# Patient Record
Sex: Male | Born: 1955 | Race: White | Hispanic: No | Marital: Married | Smoking: Never smoker
Health system: Southern US, Community
[De-identification: ages and names within clinical notes are randomized; demographics above are authoritative.]

## PROBLEM LIST (undated history)

## (undated) DIAGNOSIS — Z8489 Family history of other specified conditions: Secondary | ICD-10-CM

## (undated) DIAGNOSIS — R7303 Prediabetes: Secondary | ICD-10-CM

## (undated) DIAGNOSIS — F329 Major depressive disorder, single episode, unspecified: Secondary | ICD-10-CM

## (undated) DIAGNOSIS — G8929 Other chronic pain: Secondary | ICD-10-CM

## (undated) DIAGNOSIS — M545 Low back pain, unspecified: Secondary | ICD-10-CM

## (undated) DIAGNOSIS — G4733 Obstructive sleep apnea (adult) (pediatric): Secondary | ICD-10-CM

## (undated) DIAGNOSIS — E291 Testicular hypofunction: Secondary | ICD-10-CM

## (undated) DIAGNOSIS — N492 Inflammatory disorders of scrotum: Secondary | ICD-10-CM

## (undated) DIAGNOSIS — T7840XA Allergy, unspecified, initial encounter: Secondary | ICD-10-CM

## (undated) DIAGNOSIS — M722 Plantar fascial fibromatosis: Secondary | ICD-10-CM

## (undated) DIAGNOSIS — E785 Hyperlipidemia, unspecified: Secondary | ICD-10-CM

## (undated) DIAGNOSIS — F419 Anxiety disorder, unspecified: Secondary | ICD-10-CM

## (undated) DIAGNOSIS — F32A Depression, unspecified: Secondary | ICD-10-CM

## (undated) DIAGNOSIS — K219 Gastro-esophageal reflux disease without esophagitis: Secondary | ICD-10-CM

## (undated) DIAGNOSIS — I10 Essential (primary) hypertension: Secondary | ICD-10-CM

## (undated) DIAGNOSIS — M1990 Unspecified osteoarthritis, unspecified site: Secondary | ICD-10-CM

## (undated) HISTORY — PX: OTHER SURGICAL HISTORY: SHX169

## (undated) HISTORY — DX: Plantar fascial fibromatosis: M72.2

## (undated) HISTORY — DX: Low back pain: M54.5

## (undated) HISTORY — PX: TONSILLECTOMY: SUR1361

## (undated) HISTORY — PX: APPENDECTOMY: SHX54

## (undated) HISTORY — DX: Allergy, unspecified, initial encounter: T78.40XA

## (undated) HISTORY — DX: Low back pain, unspecified: M54.50

## (undated) HISTORY — DX: Testicular hypofunction: E29.1

## (undated) HISTORY — PX: JOINT REPLACEMENT: SHX530

## (undated) HISTORY — PX: KNEE ARTHROSCOPY: SHX127

## (undated) HISTORY — DX: Inflammatory disorders of scrotum: N49.2

## (undated) HISTORY — DX: Other chronic pain: G89.29

## (undated) HISTORY — DX: Gastro-esophageal reflux disease without esophagitis: K21.9

## (undated) HISTORY — DX: Unspecified osteoarthritis, unspecified site: M19.90

## (undated) HISTORY — DX: Obstructive sleep apnea (adult) (pediatric): G47.33

## (undated) HISTORY — DX: Anxiety disorder, unspecified: F41.9

## (undated) HISTORY — DX: Hyperlipidemia, unspecified: E78.5

---

## 1998-05-28 ENCOUNTER — Inpatient Hospital Stay (HOSPITAL_COMMUNITY): Admission: EM | Admit: 1998-05-28 | Discharge: 1998-05-29 | Payer: Self-pay | Admitting: Internal Medicine

## 1998-05-28 ENCOUNTER — Encounter: Payer: Self-pay | Admitting: Internal Medicine

## 1998-05-28 ENCOUNTER — Emergency Department (HOSPITAL_COMMUNITY): Admission: EM | Admit: 1998-05-28 | Discharge: 1998-05-28 | Payer: Self-pay | Admitting: Emergency Medicine

## 2001-04-01 ENCOUNTER — Ambulatory Visit (HOSPITAL_COMMUNITY): Admission: RE | Admit: 2001-04-01 | Discharge: 2001-04-01 | Payer: Self-pay | Admitting: Orthopedic Surgery

## 2001-04-01 ENCOUNTER — Encounter: Payer: Self-pay | Admitting: Orthopedic Surgery

## 2001-11-09 ENCOUNTER — Encounter: Payer: Self-pay | Admitting: Specialist

## 2001-11-09 ENCOUNTER — Ambulatory Visit (HOSPITAL_COMMUNITY)
Admission: RE | Admit: 2001-11-09 | Discharge: 2001-11-09 | Payer: Self-pay | Admitting: Physical Medicine & Rehabilitation

## 2004-08-20 ENCOUNTER — Ambulatory Visit: Payer: Self-pay | Admitting: Family Medicine

## 2004-11-18 ENCOUNTER — Ambulatory Visit: Payer: Self-pay | Admitting: Family Medicine

## 2005-03-24 ENCOUNTER — Inpatient Hospital Stay (HOSPITAL_COMMUNITY): Admission: RE | Admit: 2005-03-24 | Discharge: 2005-03-27 | Payer: Self-pay | Admitting: Orthopedic Surgery

## 2005-11-25 ENCOUNTER — Ambulatory Visit: Payer: Self-pay | Admitting: Family Medicine

## 2006-04-02 ENCOUNTER — Inpatient Hospital Stay (HOSPITAL_COMMUNITY): Admission: RE | Admit: 2006-04-02 | Discharge: 2006-04-04 | Payer: Self-pay | Admitting: Orthopedic Surgery

## 2006-05-26 HISTORY — PX: COLONOSCOPY: SHX174

## 2006-06-15 ENCOUNTER — Ambulatory Visit: Payer: Self-pay | Admitting: Family Medicine

## 2006-06-15 LAB — CONVERTED CEMR LAB
AST: 17 units/L (ref 0–37)
BUN: 10 mg/dL (ref 6–23)
CO2: 29 meq/L (ref 19–32)
Chloride: 105 meq/L (ref 96–112)
Creatinine, Ser: 0.9 mg/dL (ref 0.4–1.5)
Eosinophils Relative: 1.6 % (ref 0.0–5.0)
HCT: 41.9 % (ref 39.0–52.0)
HDL: 39.2 mg/dL (ref >39.0)
Hemoglobin: 14.5 g/dL (ref 13.0–17.0)
MCHC: 34.5 g/dL (ref 30.0–36.0)
MCV: 83.1 fL (ref 78.0–100.0)
Monocytes Absolute: 0.5 10*3/uL (ref 0.2–0.7)
Neutro Abs: 3.3 10*3/uL (ref 1.4–7.7)
Neutrophils Relative %: 60.6 % (ref 43.0–77.0)
TSH: 2.04 microintl units/mL (ref 0.35–5.50)
Total CHOL/HDL Ratio: 4.8
Triglycerides: 138 mg/dL (ref 0–149)
VLDL: 28 mg/dL (ref 0–40)

## 2006-06-19 ENCOUNTER — Ambulatory Visit: Payer: Self-pay | Admitting: Family Medicine

## 2006-09-21 ENCOUNTER — Ambulatory Visit: Payer: Self-pay | Admitting: Family Medicine

## 2006-10-08 ENCOUNTER — Ambulatory Visit: Payer: Self-pay | Admitting: Gastroenterology

## 2006-10-21 ENCOUNTER — Ambulatory Visit: Payer: Self-pay | Admitting: Gastroenterology

## 2006-10-21 ENCOUNTER — Encounter: Payer: Self-pay | Admitting: Gastroenterology

## 2007-02-01 ENCOUNTER — Ambulatory Visit: Payer: Self-pay | Admitting: Family Medicine

## 2007-02-01 DIAGNOSIS — J309 Allergic rhinitis, unspecified: Secondary | ICD-10-CM | POA: Insufficient documentation

## 2007-02-01 DIAGNOSIS — F419 Anxiety disorder, unspecified: Secondary | ICD-10-CM | POA: Insufficient documentation

## 2007-02-01 DIAGNOSIS — M722 Plantar fascial fibromatosis: Secondary | ICD-10-CM | POA: Insufficient documentation

## 2007-02-01 DIAGNOSIS — Z9189 Other specified personal risk factors, not elsewhere classified: Secondary | ICD-10-CM | POA: Insufficient documentation

## 2007-02-01 DIAGNOSIS — F411 Generalized anxiety disorder: Secondary | ICD-10-CM

## 2007-07-09 ENCOUNTER — Ambulatory Visit: Payer: Self-pay | Admitting: Family Medicine

## 2007-07-09 DIAGNOSIS — M1990 Unspecified osteoarthritis, unspecified site: Secondary | ICD-10-CM | POA: Insufficient documentation

## 2007-07-09 DIAGNOSIS — M4716 Other spondylosis with myelopathy, lumbar region: Secondary | ICD-10-CM | POA: Insufficient documentation

## 2007-07-09 DIAGNOSIS — S335XXA Sprain of ligaments of lumbar spine, initial encounter: Secondary | ICD-10-CM

## 2007-07-28 ENCOUNTER — Telehealth: Payer: Self-pay | Admitting: Family Medicine

## 2007-09-10 ENCOUNTER — Encounter: Payer: Self-pay | Admitting: Family Medicine

## 2007-09-10 ENCOUNTER — Telehealth (INDEPENDENT_AMBULATORY_CARE_PROVIDER_SITE_OTHER): Payer: Self-pay | Admitting: *Deleted

## 2007-11-15 ENCOUNTER — Ambulatory Visit: Payer: Self-pay | Admitting: Family Medicine

## 2007-11-15 DIAGNOSIS — R002 Palpitations: Secondary | ICD-10-CM | POA: Insufficient documentation

## 2007-11-15 DIAGNOSIS — K219 Gastro-esophageal reflux disease without esophagitis: Secondary | ICD-10-CM | POA: Insufficient documentation

## 2007-11-24 ENCOUNTER — Ambulatory Visit: Payer: Self-pay

## 2007-11-24 ENCOUNTER — Encounter: Payer: Self-pay | Admitting: Family Medicine

## 2007-11-25 ENCOUNTER — Ambulatory Visit: Payer: Self-pay

## 2007-12-08 ENCOUNTER — Telehealth: Payer: Self-pay | Admitting: Family Medicine

## 2008-02-18 ENCOUNTER — Telehealth: Payer: Self-pay | Admitting: Family Medicine

## 2008-07-28 ENCOUNTER — Telehealth: Payer: Self-pay | Admitting: Family Medicine

## 2008-09-20 ENCOUNTER — Telehealth: Payer: Self-pay | Admitting: Family Medicine

## 2008-12-04 ENCOUNTER — Telehealth: Payer: Self-pay | Admitting: Family Medicine

## 2008-12-05 ENCOUNTER — Ambulatory Visit: Payer: Self-pay | Admitting: Family Medicine

## 2008-12-05 DIAGNOSIS — M545 Low back pain, unspecified: Secondary | ICD-10-CM | POA: Insufficient documentation

## 2008-12-07 ENCOUNTER — Telehealth: Payer: Self-pay | Admitting: Family Medicine

## 2008-12-07 ENCOUNTER — Encounter: Payer: Self-pay | Admitting: Family Medicine

## 2008-12-19 ENCOUNTER — Telehealth: Payer: Self-pay | Admitting: Family Medicine

## 2008-12-28 ENCOUNTER — Encounter: Payer: Self-pay | Admitting: Family Medicine

## 2008-12-29 ENCOUNTER — Telehealth: Payer: Self-pay | Admitting: Family Medicine

## 2009-01-01 ENCOUNTER — Encounter: Payer: Self-pay | Admitting: Family Medicine

## 2009-02-09 ENCOUNTER — Telehealth: Payer: Self-pay | Admitting: Family Medicine

## 2009-05-21 ENCOUNTER — Telehealth: Payer: Self-pay | Admitting: Family Medicine

## 2009-09-17 ENCOUNTER — Ambulatory Visit: Payer: Self-pay | Admitting: Family Medicine

## 2009-09-17 DIAGNOSIS — R1013 Epigastric pain: Secondary | ICD-10-CM | POA: Insufficient documentation

## 2009-09-18 ENCOUNTER — Encounter: Admission: RE | Admit: 2009-09-18 | Discharge: 2009-09-18 | Payer: Self-pay | Admitting: Family Medicine

## 2009-10-17 ENCOUNTER — Ambulatory Visit: Payer: Self-pay | Admitting: Family Medicine

## 2009-10-17 LAB — CONVERTED CEMR LAB
Blood in Urine, dipstick: NEGATIVE
Ketones, urine, test strip: NEGATIVE
Urobilinogen, UA: 0.2

## 2009-10-18 LAB — CONVERTED CEMR LAB
Alkaline Phosphatase: 56 units/L (ref 39–117)
Basophils Absolute: 0 10*3/uL (ref 0.0–0.1)
Basophils Relative: 0.8 % (ref 0.0–3.0)
Bilirubin, Direct: 0.1 mg/dL (ref 0.0–0.3)
CO2: 29 meq/L (ref 19–32)
Calcium: 8.8 mg/dL (ref 8.4–10.5)
Cholesterol: 205 mg/dL — ABNORMAL HIGH (ref 0–200)
Creatinine, Ser: 0.8 mg/dL (ref 0.4–1.5)
Direct LDL: 145.3 mg/dL
Eosinophils Absolute: 0.1 10*3/uL (ref 0.0–0.7)
HDL: 39.2 mg/dL (ref >39.00)
Lymphocytes Relative: 27.7 % (ref 12.0–46.0)
MCHC: 34.4 g/dL (ref 30.0–36.0)
Neutrophils Relative %: 60.6 % (ref 43.0–77.0)
PSA: 1.47 ng/mL (ref 0.10–4.00)
RBC: 4.92 M/uL (ref 4.22–5.81)
Total CHOL/HDL Ratio: 5
Total Protein: 6.5 g/dL (ref 6.0–8.3)
Triglycerides: 181 mg/dL — ABNORMAL HIGH (ref 0.0–149.0)
VLDL: 36.2 mg/dL (ref 0.0–40.0)

## 2009-10-19 ENCOUNTER — Telehealth: Payer: Self-pay | Admitting: Family Medicine

## 2009-10-24 ENCOUNTER — Ambulatory Visit: Payer: Self-pay | Admitting: Family Medicine

## 2009-10-24 ENCOUNTER — Telehealth: Payer: Self-pay | Admitting: Family Medicine

## 2009-10-24 DIAGNOSIS — E785 Hyperlipidemia, unspecified: Secondary | ICD-10-CM | POA: Insufficient documentation

## 2009-10-26 ENCOUNTER — Telehealth: Payer: Self-pay | Admitting: Family Medicine

## 2009-11-13 ENCOUNTER — Telehealth: Payer: Self-pay | Admitting: Family Medicine

## 2009-11-27 ENCOUNTER — Telehealth: Payer: Self-pay | Admitting: Internal Medicine

## 2009-11-30 ENCOUNTER — Telehealth: Payer: Self-pay | Admitting: Family Medicine

## 2010-01-17 ENCOUNTER — Telehealth: Payer: Self-pay | Admitting: Family Medicine

## 2010-05-07 ENCOUNTER — Ambulatory Visit: Payer: Self-pay | Admitting: Pulmonary Disease

## 2010-05-07 DIAGNOSIS — G4733 Obstructive sleep apnea (adult) (pediatric): Secondary | ICD-10-CM | POA: Insufficient documentation

## 2010-06-03 ENCOUNTER — Encounter: Payer: Self-pay | Admitting: Pulmonary Disease

## 2010-06-03 ENCOUNTER — Ambulatory Visit (HOSPITAL_BASED_OUTPATIENT_CLINIC_OR_DEPARTMENT_OTHER)
Admission: RE | Admit: 2010-06-03 | Discharge: 2010-06-03 | Payer: Self-pay | Source: Home / Self Care | Attending: Pulmonary Disease | Admitting: Pulmonary Disease

## 2010-06-06 DIAGNOSIS — N492 Inflammatory disorders of scrotum: Secondary | ICD-10-CM

## 2010-06-06 HISTORY — DX: Inflammatory disorders of scrotum: N49.2

## 2010-06-11 ENCOUNTER — Telehealth: Payer: Self-pay | Admitting: Family Medicine

## 2010-06-12 ENCOUNTER — Ambulatory Visit
Admission: RE | Admit: 2010-06-12 | Discharge: 2010-06-12 | Payer: Self-pay | Source: Home / Self Care | Attending: Family Medicine | Admitting: Family Medicine

## 2010-06-12 ENCOUNTER — Other Ambulatory Visit: Payer: Self-pay | Admitting: Family Medicine

## 2010-06-12 DIAGNOSIS — E291 Testicular hypofunction: Secondary | ICD-10-CM | POA: Insufficient documentation

## 2010-06-12 LAB — TESTOSTERONE: Testosterone: 460.03 ng/dL (ref 350.00–890.00)

## 2010-06-16 ENCOUNTER — Observation Stay (HOSPITAL_COMMUNITY)
Admission: EM | Admit: 2010-06-16 | Discharge: 2010-06-17 | Payer: Self-pay | Source: Home / Self Care | Attending: Urology | Admitting: Urology

## 2010-06-17 ENCOUNTER — Encounter: Payer: Self-pay | Admitting: Family Medicine

## 2010-06-18 LAB — CBC
Hemoglobin: 13.6 g/dL (ref 13.0–17.0)
MCH: 30.1 pg (ref 26.0–34.0)
MCHC: 33.5 g/dL (ref 30.0–36.0)
RDW: 13.2 % (ref 11.5–15.5)

## 2010-06-19 LAB — WOUND CULTURE

## 2010-06-21 ENCOUNTER — Encounter: Payer: Self-pay | Admitting: Family Medicine

## 2010-06-25 NOTE — Assessment & Plan Note (Signed)
Summary: discuss gb symptoms/dm rsc due to conflict/njr   Vital Signs:  Patient profile:   55 year old male Temp:     98.7 degrees F oral BP sitting:   120 / 84  (left arm) Cuff size:   large  Vitals Entered By: Raechel Ache, RN (September 17, 2009 9:31 AM) CC: Talk about gallbladder- bloated, headaches, pain between shoulder blades, burping and uncomfortable.   History of Present Illness: Here for what he thinks is his gall bladder. For the past year he has had frequent spells of epigastric pain which can radiate to the middle of his back, along with belching after a meal. he has some heartburn also, but this occurs at different times than the pains he describes. No nausea or fever. His BMs ae regular. His sister is having her gall bladder out this week, and she has the same symptoms that he does.  and no problems during exertion. He takes Nexium about 3-4 days a week, but htis does not affect these pains.   Allergies: 1)  * Bee Stings 2)  Ultracet (Tramadol-Acetaminophen)  Past History:  Past Medical History: Anxiety Allergic rhinitis Osteoarthritis GERD Low back pain, sees Dr. Sharolyn Douglas  Past Surgical History: Reviewed history from 02/01/2007 and no changes required. Appendectomy  Review of Systems  The patient denies anorexia, fever, weight loss, weight gain, vision loss, decreased hearing, hoarseness, chest pain, syncope, dyspnea on exertion, peripheral edema, prolonged cough, headaches, hemoptysis, melena, hematochezia, severe indigestion/heartburn, hematuria, incontinence, genital sores, muscle weakness, suspicious skin lesions, transient blindness, difficulty walking, depression, unusual weight change, abnormal bleeding, enlarged lymph nodes, angioedema, breast masses, and testicular masses.    Physical Exam  General:  overweight-appearing.   Neck:  No deformities, masses, or tenderness noted. Chest Wall:  No deformities, masses, tenderness or gynecomastia  noted. Lungs:  Normal respiratory effort, chest expands symmetrically. Lungs are clear to auscultation, no crackles or wheezes. Heart:  Normal rate and regular rhythm. S1 and S2 normal without gallop, murmur, click, rub or other extra sounds. Abdomen:  Bowel sounds positive,abdomen soft and non-tender without masses, organomegaly or hernias noted.   Impression & Recommendations:  Problem # 1:  ABDOMINAL PAIN, EPIGASTRIC (ICD-789.06)  Orders: Radiology Referral (Radiology)  Complete Medication List: 1)  Vicodin 5-500 Mg Tabs (Hydrocodone-acetaminophen) .... 4 times a day as needed pain 2)  Ativan 1 Mg Tabs (Lorazepam) .Marland Kitchen.. 1 every 6 hours as needed for anxiety 3)  Flexeril 10 Mg Tabs (Cyclobenzaprine hcl) .... Three times a day as needed spasm 4)  Percocet 7.5-325 Mg Tabs (Oxycodone-acetaminophen) .Marland Kitchen.. 1 every 6 hours as needed pain 5)  Nexium 40 Mg Cpdr (Esomeprazole magnesium) .... Take 1 tab each morning 6)  Epipen 2-pak 0.3 Mg/0.6ml (1:1000) Devi (Epinephrine hcl (anaphylaxis)) .... As needed  Patient Instructions: 1)  I agree this sounds like a gall bladder issue. Take Nexium every day in case this is GERD related. Set up an abdominal US soon. Avoid fatty foods.

## 2010-06-25 NOTE — Progress Notes (Signed)
Summary: REFILL REQUEST  Phone Note Refill Request Message from:  Patient on January 17, 2010 3:52 PM  Refills Requested: Medication #1:  PERCOCET 7.5-325 MG  TABS 1 every 6 hours as needed pain   Notes: Pt can be reached at [phone] when Rx is ready for p/u.    Initial call taken by: Debbra Riding,  January 17, 2010 3:52 PM  Follow-up for Phone Call        done Follow-up by: Nelwyn Salisbury MD,  January 18, 2010 8:44 AM    Prescriptions: PERCOCET 7.5-325 MG  TABS (OXYCODONE-ACETAMINOPHEN) 1 every 6 hours as needed pain  #60 x 0   Entered and Authorized by:   Nelwyn Salisbury MD   Signed by:   Nelwyn Salisbury MD on 01/18/2010   Method used:   Print then Give to Patient   RxID:   [id number]

## 2010-06-25 NOTE — Medication Information (Signed)
Summary: Prior Authorization Request and Approval for Androderm  Prior Authorization Request and Approval for Androderm   Imported By: Maryln Gottron 10/31/2009 11:02:44  _____________________________________________________________________  External Attachment:    Type:   Image     Comment:   External Document

## 2010-06-25 NOTE — Assessment & Plan Note (Signed)
Summary: CPX // RS   Vital Signs:  Patient profile:   55 year old male Height:      73.25 inches Weight:      362 pounds BMI:     47.61 Temp:     98.6 degrees F oral BP sitting:   132 / 92  (right arm) Cuff size:   regular  Vitals Entered By: Kern Reap CMA Duncan Dull) (October 24, 2009 1:25 PM)  History of Present Illness: 55 yr old male for a cpx. He feels good except for chronic low back pain, and he knows he is very overweight. he is trying to exercise a bit, and he plans on eating a healthier diet. He also thinks he has ADHD. He has had trouble focusing all his life, even as a child in school. Now he has trouble staying focused behind the wheel. 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 His son has been treated for ADHD with Adderall for years, and he does well.   Preventive Screening-Counseling & Management  Alcohol-Tobacco     Smoking Status: quit  Allergies: 1)  * Bee Stings 2)  Ultracet (Tramadol-Acetaminophen)  Past History:  Past Medical History: Anxiety Allergic rhinitis Osteoarthritis GERD Low back pain, sees Dr. Sharolyn Douglas plantar fasciitis Hyperlipidemia hyperglycemia hypogonadism  Past Surgical History: Appendectomy Total knee replacements, bilateral per Dr. Eulah Pont colonoscopy 2008, benign polyps, repeat in 5 yrs  Family History: Reviewed history and no changes required. Family History of Stroke F 1st degree relative <60 GF had a brain tumor  Social History: Reviewed history and no changes required. Married Former Smoker Alcohol use-yes Smoking Status:  quit  Review of Systems  The patient denies anorexia, fever, weight loss, vision loss, decreased hearing, hoarseness, chest pain, syncope, dyspnea on exertion, peripheral edema, prolonged cough, headaches, hemoptysis, abdominal pain, melena, hematochezia, severe indigestion/heartburn, hematuria, incontinence, genital sores,  muscle weakness, suspicious skin lesions, transient blindness, difficulty walking, depression, unusual weight change, abnormal bleeding, enlarged lymph nodes, angioedema, breast masses, and testicular masses.    Physical Exam  General:  morbidly obese  Head:  Normocephalic and atraumatic without obvious abnormalities. No apparent alopecia or balding. Eyes:  No corneal or conjunctival inflammation noted. EOMI. Perrla. Funduscopic exam benign, without hemorrhages, exudates or papilledema. Vision grossly normal. Ears:  External ear exam shows no significant lesions or deformities.  Otoscopic examination reveals clear canals, tympanic membranes are intact bilaterally without bulging, retraction, inflammation or discharge. Hearing is grossly normal bilaterally. Nose:  External nasal examination shows no deformity or inflammation. Nasal mucosa are pink and moist without lesions or exudates. Mouth:  Oral mucosa and oropharynx without lesions or exudates.  Teeth in good repair. Neck:  No deformities, masses, or tenderness noted. Chest Wall:  No deformities, masses, tenderness or gynecomastia noted. Lungs:  Normal respiratory effort, chest expands symmetrically. Lungs are clear to auscultation, no crackles or wheezes. Heart:  Normal rate and regular rhythm. S1 and S2 normal without gallop, murmur, click, rub or other extra sounds. EKG normal  Abdomen:  Bowel sounds positive,abdomen soft and non-tender without masses, organomegaly or hernias noted. Rectal:  No external abnormalities noted. Normal sphincter tone. No rectal masses or tenderness. Heme neg.  Genitalia:  Testes bilaterally descended without nodularity, tenderness or masses. No scrotal masses or lesions. No penis lesions or urethral discharge. Prostate:  Prostate gland firm and smooth, no enlargement, nodularity, tenderness, mass, asymmetry  or induration. Msk:  No deformity or scoliosis noted of thoracic or lumbar spine.   Pulses:  R and L  carotid,radial,femoral,dorsalis pedis and posterior tibial pulses are full and equal bilaterally Extremities:  No clubbing, cyanosis, edema, or deformity noted with normal full range of motion of all joints.   Neurologic:  No cranial nerve deficits noted. Station and gait are normal. Plantar reflexes are down-going bilaterally. DTRs are symmetrical throughout. Sensory, motor and coordinative functions appear intact. Skin:  Intact without suspicious lesions or rashes Cervical Nodes:  No lymphadenopathy noted Axillary Nodes:  No palpable lymphadenopathy Inguinal Nodes:  No significant adenopathy Psych:  Cognition and judgment appear intact. Alert and cooperative with normal attention span and concentration. No apparent delusions, illusions, hallucinations   Impression & Recommendations:  Problem # 1:  HEALTH MAINTENANCE EXAM (ICD-V70.0)  Orders: Hemoccult Guaiac-1 spec.(in office) (82270) EKG w/ Interpretation (93000)  Complete Medication List: 1)  Vicodin 5-500 Mg Tabs (Hydrocodone-acetaminophen) .... 4 times a day as needed pain 2)  Ativan 1 Mg Tabs (Lorazepam) .Marland Kitchen.. 1 every 6 hours as needed for anxiety 3)  Flexeril 10 Mg Tabs (Cyclobenzaprine hcl) .... Three times a day as needed spasm 4)  Percocet 7.5-325 Mg Tabs (Oxycodone-acetaminophen) .Marland Kitchen.. 1 every 6 hours as needed pain 5)  Nexium 40 Mg Cpdr (Esomeprazole magnesium) .... Take 1 tab each morning 6)  Epipen 2-pak 0.3 Mg/0.29ml (1:1000) Devi (Epinephrine hcl (anaphylaxis)) .... As needed 7)  Celebrex 200 Mg Caps (Celecoxib) .... Take one tab by mouth two times a day 8)  Androgel Pump 1 % Gel (Testosterone) .... Apply 10 grams once daily 9)  Adderall Xr 20 Mg Xr24h-cap (Amphetamine-dextroamphetamine) .... Once daily  Patient Instructions: 1)  It is important that you exercise reguarly at least 20 minutes 5 times a week. If you develop chest pain, have severe difficulty breathing, or feel very tired, stop exercising immediately and seek  medical attention.  2)  You need to lose weight. Consider a lower calorie diet and regular exercise.  3)  Try Adderall for a month.  4)  Start on Androgel, and recheck a level in 6 months Prescriptions: ADDERALL XR 20 MG XR24H-CAP (AMPHETAMINE-DEXTROAMPHETAMINE) once daily  #30 x 0   Entered and Authorized by:   Nelwyn Salisbury MD   Signed by:   Nelwyn Salisbury MD on 10/24/2009   Method used:   Print then Give to Patient   RxID:   [id number] ANDROGEL PUMP 1 % GEL (TESTOSTERONE) apply 10 grams once daily  #30 x 11   Entered and Authorized by:   Nelwyn Salisbury MD   Signed by:   Nelwyn Salisbury MD on 10/24/2009   Method used:   Print then Give to Patient   RxID:   [id number] CELEBREX 200 MG CAPS (CELECOXIB) take one tab by mouth two times a day  #60 x 11   Entered and Authorized by:   Nelwyn Salisbury MD   Signed by:   Nelwyn Salisbury MD on 10/24/2009   Method used:   Print then Give to Patient   RxID:   [id number] NEXIUM 40 MG CPDR (ESOMEPRAZOLE MAGNESIUM) Take 1 tab each morning  #30 x 11   Entered and Authorized by:   Nelwyn Salisbury MD   Signed by:   Nelwyn Salisbury MD on 10/24/2009   Method used:   Print then Give to Patient   RxID:   [id number] PERCOCET 7.5-325 MG  TABS (OXYCODONE-ACETAMINOPHEN) 1 every  6 hours as needed pain  #60 x 0   Entered and Authorized by:   Nelwyn Salisbury MD   Signed by:   Nelwyn Salisbury MD on 10/24/2009   Method used:   Print then Give to Patient   RxID:   [id number] FLEXERIL 10 MG  TABS (CYCLOBENZAPRINE HCL) three times a day as needed spasm  #60 x 5   Entered and Authorized by:   Nelwyn Salisbury MD   Signed by:   Nelwyn Salisbury MD on 10/24/2009   Method used:   Print then Give to Patient   RxID:   [id number] ATIVAN 1 MG TABS (LORAZEPAM) 1 every 6 hours as needed for anxiety  #60 x 5   Entered and Authorized by:   Nelwyn Salisbury MD   Signed by:   Nelwyn Salisbury MD on 10/24/2009   Method used:   Print then Give to Patient   RxID:    [id number] VICODIN 5-500 MG TABS (HYDROCODONE-ACETAMINOPHEN) 4 times a day as needed pain  #60 x 5   Entered and Authorized by:   Nelwyn Salisbury MD   Signed by:   Nelwyn Salisbury MD on 10/24/2009   Method used:   Print then Give to Patient   RxID:   [id number]    Immunization History:  Tetanus/Td Immunization History:    Tetanus/Td:  historical (05/26/2006)

## 2010-06-25 NOTE — Progress Notes (Signed)
Summary: rtc  Phone Note Call from Patient Call back at Work Phone [phone]   Caller: Patient Call For: Nelwyn Salisbury MD Summary of Call: pt is return judi call Initial call taken by: Heron Sabins,  Oct 19, 2009 9:59 AM  Follow-up for Phone Call        report called. Follow-up by: Raechel Ache, RN,  Oct 19, 2009 10:16 AM

## 2010-06-25 NOTE — Progress Notes (Signed)
Summary: PA androgel  Phone Note Call from Patient Call back at Home Phone [phone]   Caller: Patient Call For: Nelwyn Salisbury MD Summary of Call: PT STATED ANDROGEL PUMP 1 % NEEDS PA CALL [phone] [facility] [phone],  Initial call taken by: Heron Sabins,  October 24, 2009 4:14 PM  Follow-up for Phone Call        pa has been iniatiated [id number] Follow-up by: Heron Sabins,  October 24, 2009 4:24 PM  Additional Follow-up for Phone Call Additional follow up Details #1::        pa has been approved  from 10-24-2009 thru 07-19-2012 wendy pharmacist tech is aware.PT is aware Additional Follow-up by: Heron Sabins,  October 25, 2009 4:14 PM    +

## 2010-06-25 NOTE — Progress Notes (Signed)
Summary: med error  Phone Note From Pharmacy Call back at [phone]   Caller: [facility]* Summary of Call: Rayfield Citizen, pharmacist calling to report error. Latest Rx for Percocet gave him #90 instead of #60. Will you write new Rx to match this? Initial call taken by: Raechel Ache, RN,  October 26, 2009 2:40 PM  Follow-up for Phone Call        please get more info about this  Follow-up by: Nelwyn Salisbury MD,  October 26, 2009 3:39 PM  Additional Follow-up for Phone Call Additional follow up Details #1::        gave verbal order #90 ok - no additional co-pay for at. Additional Follow-up by: Raechel Ache, RN,  October 26, 2009 3:58 PM    Additional Follow-up for Phone Call Additional follow up Details #2::    noted Follow-up by: Nelwyn Salisbury MD,  October 26, 2009 4:09 PM

## 2010-06-25 NOTE — Progress Notes (Signed)
Summary: new Rx  Phone Note From Pharmacy   Caller: [facility]* Summary of Call: requesting new Rx for Flonase (not on med list)  Follow-up for Phone Call        call in Flonase, 2 sprays each nostril once daily , one year supply Follow-up by: Nelwyn Salisbury MD,  December 03, 2009 1:18 PM    New/Updated Medications: FLONASE 50 MCG/ACT SUSP (FLUTICASONE PROPIONATE) 2 sprays ea nostril once daily Prescriptions: FLONASE 50 MCG/ACT SUSP (FLUTICASONE PROPIONATE) 2 sprays ea nostril once daily  #30 days x 11   Entered by:   Raechel Ache, RN   Authorized by:   Nelwyn Salisbury MD   Signed by:   Raechel Ache, RN on 12/03/2009   Method used:   Electronically to        [facility]* (retail)       [address]       [place], [place]  [address]       Ph: [phone]       Fax: [phone]   RxID:   [id number]

## 2010-06-25 NOTE — Progress Notes (Signed)
Summary: antibiotic  Phone Note Call from Patient   Caller: Patient Call For: Nelwyn Salisbury MD Summary of Call: Pt states he clips his toenails and cut his toe a few days ago, and has become red, and infected.  Is working out of town, and would like an antibiotic called to [facility]. [phone] Initial call taken by: Lynann Beaver CMA,  November 13, 2009 10:06 AM  Follow-up for Phone Call        call in Keflex 500mg  three times a day for 10 days Follow-up by: Nelwyn Salisbury MD,  November 13, 2009 10:52 AM  Additional Follow-up for Phone Call Additional follow up Details #1::        Rx Called In Additional Follow-up by: Raechel Ache, RN,  November 13, 2009 11:41 AM

## 2010-06-25 NOTE — Progress Notes (Signed)
Summary: infected toenail  Phone Note Call from Patient   Caller: Patient Call For: Nelwyn Salisbury MD Summary of Call: [facility] [phone] Dr. Clent Ridges was treating pt for infected toenail, and took ten days of Keflex.Marland Kitchen...was doing better, but went to beach, and has become a little red and more painful.  Would like to refill antibiotic one more time.  Initial call taken by: Lynann Beaver CMA,  November 27, 2009 9:09 AM  Follow-up for Phone Call        ok to refill   x 1 and then  any further  rx per Dr Clent Ridges Follow-up by: Madelin Headings MD,  November 27, 2009 1:05 PM    New/Updated Medications: KEFLEX 500 MG CAPS (CEPHALEXIN) one by mouth three times a day x 10 days Prescriptions: KEFLEX 500 MG CAPS (CEPHALEXIN) one by mouth three times a day x 10 days  #30 x 0   Entered by:   Lynann Beaver CMA   Authorized by:   Evelena Peat MD   Signed by:   Lynann Beaver CMA on 11/27/2009   Method used:   Electronically to        [facility]* (retail)       [address]       [place], [place]  [id number]       Ph: [phone]       Fax: [phone]   RxID:   [id number]  Pt. notified.

## 2010-06-26 ENCOUNTER — Ambulatory Visit (INDEPENDENT_AMBULATORY_CARE_PROVIDER_SITE_OTHER): Payer: BC Managed Care – PPO | Admitting: Pulmonary Disease

## 2010-06-26 ENCOUNTER — Encounter: Payer: Self-pay | Admitting: Pulmonary Disease

## 2010-06-26 DIAGNOSIS — G4733 Obstructive sleep apnea (adult) (pediatric): Secondary | ICD-10-CM

## 2010-06-27 NOTE — Progress Notes (Signed)
Summary: rx ketoconazole   Phone Note Call from Patient   Caller: Patient Call For: Nelwyn Salisbury MD Summary of Call: [phone] Wants and antifungal for jock itch called to [place]. Initial call taken by: Lynann Beaver CMA AAMA,  June 11, 2010 2:14 PM  Follow-up for Phone Call        call in ketoconazole 2% cream to apply three times a day as needed , 60 grams with 2 rf  Follow-up by: Nelwyn Salisbury MD,  June 11, 2010 4:34 PM  Additional Follow-up for Phone Call Additional follow up Details #1::        called pt aware  Additional Follow-up by: Pura Spice, RN,  June 11, 2010 4:48 PM    New/Updated Medications: KETOCONAZOLE 2 % CREA (KETOCONAZOLE) apply three times a day as needed Prescriptions: KETOCONAZOLE 2 % CREA (KETOCONAZOLE) apply three times a day as needed  #60 grams x 2   Entered by:   Pura Spice, RN   Authorized by:   Nelwyn Salisbury MD   Signed by:   Pura Spice, RN on 06/11/2010   Method used:   Electronically to        [facility]* (retail)       [address]       [place], [place]  [id number]       Ph: [phone]       Fax: [phone]   RxID:   [id number]

## 2010-06-27 NOTE — Assessment & Plan Note (Signed)
Summary: consult for possible osa   Visit Type:  Initial Consult Copy to:  Gershon Crane MD Primary Provider/Referring Provider:  Nelwyn Salisbury MD  CC:  pt states he snores a lot and wakes up tired and feels like he doesn't get enough air. Marland Kitchen  History of Present Illness: the pt is a 54y/o male who I have been asked to see for possible osa.  He has been noted to have loud snoring as well as an abnormal breathing pattern during sleep, but denies choking arousals.  He goes to bed btw 10-1130pm, and arises at 730am to start his day.  He has increased awakenings at night, and does not feel rested upon awakening in the am's.  He works in [employer], and notes some sleep pressure with inactivity in the afternoon.  He also notes some sleepiness with afternoon driving, but takes adderall as needed.  He denies dozing with tv or movies in the evening.  His epworth score today is 4, and he notes his weight has increased 10 pounds from 2 years ago.    Current Medications (verified): 1)  Vicodin 5-500 Mg Tabs (Hydrocodone-Acetaminophen) .... 4 Times A Day As Needed Pain 2)  Ativan 1 Mg Tabs (Lorazepam) .Marland Kitchen.. 1 Every 6 Hours As Needed For Anxiety 3)  Flexeril 10 Mg  Tabs (Cyclobenzaprine Hcl) .... Three Times A Day As Needed Spasm 4)  Percocet 7.5-325 Mg  Tabs (Oxycodone-Acetaminophen) .Marland Kitchen.. 1 Every 6 Hours As Needed Pain 5)  Nexium 40 Mg Cpdr (Esomeprazole Magnesium) .... Take 1 Tab Each Morning 6)  Epipen 2-Pak 0.3 Mg/0.34ml (1:1000) Devi (Epinephrine Hcl (Anaphylaxis)) .... As Needed 7)  Celebrex 200 Mg Caps (Celecoxib) .... Take One Tab By Mouth Two Times A Day 8)  Androgel Pump 1 % Gel (Testosterone) .... Apply 10 Grams Once Daily 9)  Adderall Xr 20 Mg Xr24h-Cap (Amphetamine-Dextroamphetamine) .... Once Daily 10)  Keflex 500 Mg Caps (Cephalexin) .... One By Mouth Three Times A Day X 10 Days 11)  Flonase 50 Mcg/act Susp (Fluticasone Propionate) .... 2 Sprays Ea Nostril Once Daily  Allergies (verified): 1)  * Bee  Stings 2)  Ultracet (Tramadol-Acetaminophen)  Past History:  Past Medical History: Anxiety Allergic rhinitis Osteoarthritis GERD Low back pain, sees Dr. Sharolyn Douglas plantar fasciitis Hyperlipidemia hypogonadism  Past Surgical History: Appendectomy tonsillectomy Total knee replacements, bilateral per Dr. Eulah Pont colonoscopy 2008, benign polyps, repeat in 5 yrs  Family History: Reviewed history from 10/24/2009 and no changes required. Family History of Stroke F 1st degree relative <60 PGF had a brain tumor  Social History: Reviewed history from 10/24/2009 and no changes required. Married Former Smoker. quit 1978. <1 ppd. started in 6th grade Alcohol use-yes occupation: sales  Review of Systems       The patient complains of shortness of breath with activity, shortness of breath at rest, indigestion, weight change, nasal congestion/difficulty breathing through nose, sneezing, anxiety, depression, and rash.  The patient denies productive cough, non-productive cough, coughing up blood, chest pain, irregular heartbeats, acid heartburn, loss of appetite, abdominal pain, difficulty swallowing, sore throat, tooth/dental problems, headaches, itching, ear ache, hand/feet swelling, joint stiffness or pain, change in color of mucus, and fever.    Vital Signs:  Patient profile:   55 year old male Height:      73.25 inches Weight:      374.38 pounds BMI:     49.23 O2 Sat:      92 % on Room air Temp:     98.1  degrees F oral Pulse rate:   83 / minute BP sitting:   116 / 78  (left arm) Cuff size:   large  Vitals Entered By: Carver Fila (May 07, 2010 10:25 AM)  O2 Flow:  Room air CC: pt states he snores a lot and wakes up tired, feels like he doesn't get enough air.  Comments meds and allergies updated Phone number updated Carver Fila  May 07, 2010 10:25 AM    Physical Exam  General:  obese male in nad Eyes:  PERRLA and EOMI.   Nose:  mild deviation to left with  narrowing Mouth:  small space posteriorly, +tissue redundancy elongation of soft palate Neck:  no jvd, tmg, LN Lungs:  clear to auscultation Heart:  rrr, no mrg Abdomen:  soft and nontender, bs+ Extremities:  no edema or cyanosis, pulses intact distally Neurologic:  alert and oriented, moves all 4.   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  I have had a long discussion with the pt about sleep apnea, including its impact on QOL and CV health.  I think there is enough concern to justify formal sleep testing, and the pt is agreeable.  I have encouraged him to work aggressively on weight loss, and have also reminded him of his moral responsibility to not drive if sleepy.  Other Orders: New Patient Level IV (16109) Sleep Disorder Referral (Sleep Disorder)  Patient Instructions: 1)  work on weight loss, and do not drive if sleepy 2)  will schedule for a sleep study, and will arrange followup once the results are available.

## 2010-07-03 NOTE — Letter (Signed)
Summary: [facility]  [facility]   Imported By: Maryln Gottron 06/28/2010 11:15:56  _____________________________________________________________________  External Attachment:    Type:   Image     Comment:   External Document

## 2010-07-03 NOTE — Assessment & Plan Note (Signed)
Summary: ov to discuss sleep study results/mg   Vital Signs:  Patient profile:   55 year old male Height:      73.25 inches Weight:      372.38 pounds BMI:     48.97 O2 Sat:      94 % on Room air Temp:     98.4 degrees F oral Pulse rate:   74 / minute BP sitting:   124 / 80  (right arm) Cuff size:   large  Vitals Entered By: Arman Filter LPN (June 26, 2010 11:48 AM)  O2 Flow:  Room air CC: Ov to discuss sleep study results.  Comments Medications reviewed with patient Arman Filter LPN  June 26, 2010 11:48 AM    Copy to:  Gershon Crane MD Primary Provider/Referring Provider:  Nelwyn Salisbury MD  CC:  Ov to discuss sleep study results. .  History of Present Illness: the pt comes in today for f/u of his recent sleep study.  He was found to have severe osa, with AHI 47/hr and desats to 79%.  He was also noted to have large numbers of leg jerks, with 4/hr resulting in arousal or awakening.  I have reviewed the study in detail with him, and answered all of his questions.  Current Medications (verified): 1)  Vicodin 5-500 Mg Tabs (Hydrocodone-Acetaminophen) .... 4 Times A Day As Needed Pain 2)  Ativan 1 Mg Tabs (Lorazepam) .Marland Kitchen.. 1 Every 6 Hours As Needed For Anxiety 3)  Flexeril 10 Mg  Tabs (Cyclobenzaprine Hcl) .... Three Times A Day As Needed Spasm 4)  Percocet 7.5-325 Mg  Tabs (Oxycodone-Acetaminophen) .Marland Kitchen.. 1 Every 6 Hours As Needed Pain 5)  Nexium 40 Mg Cpdr (Esomeprazole Magnesium) .... Take 1 Tab Each Morning 6)  Epipen 2-Pak 0.3 Mg/0.68ml (1:1000) Devi (Epinephrine Hcl (Anaphylaxis)) .... As Needed 7)  Celebrex 200 Mg Caps (Celecoxib) .... Take One Tab By Mouth Two Times A Day 8)  Androgel Pump 1 % Gel (Testosterone) .... Apply 10 Grams Once Daily As Needed 9)  Adderall Xr 20 Mg Xr24h-Cap (Amphetamine-Dextroamphetamine) .... Once Daily 10)  Clindamycin Hcl 300 Mg Caps (Clindamycin Hcl) .... Take 1 Tablet By Mouth Two Times A Day 11)  Flonase 50 Mcg/act Susp (Fluticasone  Propionate) .... 2 Sprays Ea Nostril Once Daily 12)  Ketoconazole 2 % Crea (Ketoconazole) .... Apply Three Times A Day As Needed  Allergies (verified): 1)  * Bee Stings 2)  Ultracet (Tramadol-Acetaminophen)  Review of Systems       The patient complains of loss of appetite, weight change, anxiety, depression, and joint stiffness or pain.  The patient denies shortness of breath with activity, shortness of breath at rest, productive cough, non-productive cough, coughing up blood, chest pain, irregular heartbeats, acid heartburn, indigestion, abdominal pain, difficulty swallowing, sore throat, tooth/dental problems, headaches, nasal congestion/difficulty breathing through nose, sneezing, itching, ear ache, hand/feet swelling, rash, change in color of mucus, and fever.    Physical Exam  General:  ow male in nad Nose:  no discharge or purulence noted. Extremities:  mild ankle edema, no cyanosis  Neurologic:  alert and oriented, moves all 4.   Impression & Recommendations:  Problem # 1:  OBSTRUCTIVE SLEEP APNEA (ICD-327.23) the pt has severe osa by his recent sleep study, and would be best served by cpap therapy while working on weight loss.  He is willing to try this.  I will set the patient up on cpap at a moderate pressure level to allow  for desensitization, and will troubleshoot the device over the next 4-6weeks if needed.  The pt is to call me if having issues with tolerance.  Will then optimize the pressure once patient is able to wear cpap on a consistent basis. 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  Medications Added to Medication List This Visit: 1)  Androgel Pump 1 % Gel (Testosterone) .... Apply 10 grams once daily as needed 2)  Clindamycin Hcl 300 Mg Caps (Clindamycin hcl) .... Take 1 tablet by mouth two times a  day  Other Orders: Est. Patient Level III (16109) DME Referral (DME)  Patient Instructions: 1)  will set up on cpap.  Please call if having issues with tolerance 2)  work on weight loss 3)  followup with me in 5weeks.   Orders Added: 1)  Est. Patient Level III [60454] 2)  DME Referral [DME]

## 2010-07-15 NOTE — Op Note (Signed)
  NAME:  Raymond Herrera, Raymond Herrera NO.:  [id number]  MEDICAL RECORD NO.:  [id number]          PATIENT TYPE:  OBV  LOCATION:  1524                         FACILITY[facility]  PHYSICIAN:  Valetta Fuller, M.D.  DATE OF BIRTH:  [birth year]  DATE OF PROCEDURE:  06/16/2010 DATE OF DISCHARGE:  06/17/2010                              OPERATIVE REPORT   PREOPERATIVE DIAGNOSIS:  Scrotal abscess.  POSTOPERATIVE DIAGNOSIS:  Scrotal abscess.  PROCEDURE PERFORMED:  Incision and drainage of scrotal abscess.  INDICATIONS:  [name] is 55 years of age.  He had noticed a small "boil" on a scrotal wall with some erythema.  He was seen at urgent care where he was started on oral antibiotics.  Unfortunately, this increased in size.  He was felt to have a substantial area of induration with a mild area of fluctuance.  This was consistent with start of early abscess formation and we felt the patient would benefit from incision and drainage.  Rationale for this was explained to the patient and full informed consent obtained.  The patient has received some perioperative Rocephin.  TECHNIQUE AND FINDINGS:  The patient was brought to the operating room where he had successful induction of general anesthesia.  He was prepped and draped in usual manner.  On the dependent aspect of the scrotum on the right side, there was approximately a 4 x 5 area of induration with a small area of fluctuance.  Incision was made for distance of 2 cm to 3 cm.  A small amount of purulent material was obtained.  There was no evidence of a necrotizing infection.  Some tissue was sent for culture. This cavity was relatively small.  It was copiously irrigated and then packed with iodoform gauze.  The patient appeared to tolerate the procedure well and there were no obvious complications or difficulties. He was brought to recovery room in stable condition.     Valetta Fuller, M.D.     DSG/MEDQ  D:   07/02/2010  T:  07/03/2010  Job:  [id number]  Electronically Signed by Barron Alvine M.D. on 07/15/2010 12:02:04 PM

## 2010-07-31 ENCOUNTER — Encounter: Payer: Self-pay | Admitting: Pulmonary Disease

## 2010-07-31 ENCOUNTER — Ambulatory Visit (INDEPENDENT_AMBULATORY_CARE_PROVIDER_SITE_OTHER): Payer: BC Managed Care – PPO | Admitting: Pulmonary Disease

## 2010-07-31 DIAGNOSIS — G4733 Obstructive sleep apnea (adult) (pediatric): Secondary | ICD-10-CM

## 2010-08-08 ENCOUNTER — Other Ambulatory Visit: Payer: Self-pay

## 2010-08-08 NOTE — Telephone Encounter (Signed)
Received fax from [facility] pt wants to try androgel 1.62  Need new rx

## 2010-08-13 NOTE — Assessment & Plan Note (Signed)
Summary: rov for osa management    Copy to:  Gershon Crane MD Primary Provider/Referring Provider:  Nelwyn Salisbury MD  CC:  Pt is here for a 5 week f/u appt.  Pt states he wears his cpap machine every night.  Approx 7 to 8 hours per night.  Denies any complaints with mask or pressure.  Pt does c/o nasal congestion during the night while on cpap. Pt states he feels rested when he wakes up in the AM. .  History of Present Illness: the pt comes in today for f/u of his osa.  He was started on cpap last visit, and has done very well.  He is sleeping great, with improved daytime alertness.  He is having no issues with mask fit or pressure.  His bedpartner has not heard breakthru snoring.    Current Medications (verified): 1)  Vicodin 5-500 Mg Tabs (Hydrocodone-Acetaminophen) .... 4 Times A Day As Needed Pain 2)  Ativan 1 Mg Tabs (Lorazepam) .Marland Kitchen.. 1 Every 6 Hours As Needed For Anxiety 3)  Flexeril 10 Mg  Tabs (Cyclobenzaprine Hcl) .... Three Times A Day As Needed Spasm 4)  Nexium 40 Mg Cpdr (Esomeprazole Magnesium) .... Take 1 Tab Each Morning 5)  Epipen 2-Pak 0.3 Mg/0.64ml (1:1000) Devi (Epinephrine Hcl (Anaphylaxis)) .... As Needed 6)  Celebrex 200 Mg Caps (Celecoxib) .... Take 1 Tablet By Mouth Once A Day 7)  Androgel Pump 1 % Gel (Testosterone) .... Apply 10 Grams Once Daily As Needed 8)  Adderall Xr 20 Mg Xr24h-Cap (Amphetamine-Dextroamphetamine) .... Once Daily 9)  Flonase 50 Mcg/act Susp (Fluticasone Propionate) .... 2 Sprays Ea Nostril Once Daily 10)  Ketoconazole 2 % Crea (Ketoconazole) .... Apply Three Times A Day As Needed  Allergies (verified): 1)  * Bee Stings 2)  Ultracet (Tramadol-Acetaminophen)  Past History:  Past medical, surgical, family and social histories (including risk factors) reviewed, and no changes noted (except as noted below).  Past Medical History: Reviewed history from 05/07/2010 and no changes required. Anxiety Allergic rhinitis Osteoarthritis GERD Low back  pain, sees Dr. Sharolyn Douglas plantar fasciitis Hyperlipidemia hypogonadism  Past Surgical History: Reviewed history from 05/07/2010 and no changes required. Appendectomy tonsillectomy Total knee replacements, bilateral per Dr. Eulah Pont colonoscopy 2008, benign polyps, repeat in 5 yrs  Family History: Reviewed history from 05/07/2010 and no changes required. Family History of Stroke F 1st degree relative <60 PGF had a brain tumor  Social History: Reviewed history from 05/07/2010 and no changes required. Married Former Smoker. quit 1978. <1 ppd. started in 6th grade Alcohol use-yes occupation: sales  Review of Systems       The patient complains of headaches, nasal congestion/difficulty breathing through nose, anxiety, depression, and joint stiffness or pain.  The patient denies shortness of breath with activity, shortness of breath at rest, productive cough, non-productive cough, coughing up blood, chest pain, irregular heartbeats, acid heartburn, indigestion, loss of appetite, weight change, abdominal pain, difficulty swallowing, sore throat, tooth/dental problems, sneezing, itching, ear ache, hand/feet swelling, rash, change in color of mucus, and fever.    Vital Signs:  Patient profile:   55 year old male Height:      73.25 inches Weight:      380.13 pounds BMI:     49.99 O2 Sat:      92 % on Room air Temp:     98.2 degrees F oral Pulse rate:   89 / minute BP sitting:   126 / 76  (left arm) Cuff size:  large  Vitals Entered By: Arman Filter LPN (July 30, 1608 2:58 PM)  O2 Flow:  Room air CC: Pt is here for a 5 week f/u appt.  Pt states he wears his cpap machine every night.  Approx 7 to 8 hours per night.  Denies any complaints with mask or pressure.  Pt does c/o nasal congestion during the night while on cpap. Pt states he feels rested when he wakes up in the AM.  Comments Medications reviewed with patient Arman Filter LPN  July 31, 9602 2:58 PM    Physical  Exam  General:  obese male in nad  Nose:  no skin breakdown or pressure necrosis from cpap mask  Extremities:  no edema or cyanosis  Neurologic:  alert, does not appear sleepy, moves all 4    Impression & Recommendations:  Problem # 1:  OBSTRUCTIVE SLEEP APNEA (ICD-327.23) the pt is doing well with cpap, and has already seen improvement in his sleep and daytime alertness.  He is not having any tolerance issues with the device.  We need to optimize pressure for him, and have again encouraged him to work on weight loss.   Care Plan:  At this point, will arrange for the patient's machine to be changed over to auto mode for 2 weeks to optimize their pressure.  I will review the downloaded data once sent by dme, and also evaluate for compliance, leaks, and residual osa.  I will call the patient and dme to discuss the results, and have the patient's machine set appropriately.  This will serve as the pt's cpap pressure titration.  Medications Added to Medication List This Visit: 1)  Celebrex 200 Mg Caps (Celecoxib) .... Take 1 tablet by mouth once a day  Other Orders: Est. Patient Level III (54098) DME Referral (DME)  Patient Instructions: 1)  will put machine on auto mode to calibrate the pressure for you.  Will call you with results.  2)  work on weight loss 3)  followup with me in 6mos

## 2010-08-15 ENCOUNTER — Telehealth: Payer: Self-pay | Admitting: *Deleted

## 2010-08-15 MED ORDER — TESTOSTERONE 20.25 MG/ACT (1.62%) TD GEL: 4.0000 | Freq: Every day | TRANSDERMAL | Status: DC

## 2010-08-15 NOTE — Telephone Encounter (Signed)
Notified [facility] and rx for androel as below given  .

## 2010-08-15 NOTE — Telephone Encounter (Signed)
Pt would like to try the androgel 1.62% instead of the 1%. Is this okay to change to?

## 2010-08-15 NOTE — Telephone Encounter (Signed)
Change to Androgel 1.62%, to apply 4 pumps daily. Call in a 6 month supply

## 2010-08-15 NOTE — Telephone Encounter (Signed)
Already done

## 2010-08-24 ENCOUNTER — Other Ambulatory Visit: Payer: Self-pay | Admitting: Pulmonary Disease

## 2010-10-05 ENCOUNTER — Other Ambulatory Visit: Payer: Self-pay | Admitting: Pulmonary Disease

## 2010-10-05 DIAGNOSIS — G4733 Obstructive sleep apnea (adult) (pediatric): Secondary | ICD-10-CM

## 2010-10-11 NOTE — Op Note (Signed)
NAME:  BABY, STAIRS NO.:  [id number]   MEDICAL RECORD NO.:  [id number]          PATIENT TYPE:  INP   LOCATION:  2899                         FACILITY:  [facility]   PHYSICIAN:  Loreta Ave, M.D. DATE OF BIRTH:  04/03/1956   DATE OF PROCEDURE:  03/24/2005  DATE OF DISCHARGE:                                 OPERATIVE REPORT   PREOPERATIVE DIAGNOSIS:  End stage degenerative arthritis, varus alignment,  flexion contracture, right knee.  Degenerative arthritis, left knee, not end  stage.   POSTOPERATIVE DIAGNOSIS:  End stage degenerative arthritis, varus alignment,  flexion contracture, right knee.  Degenerative arthritis, left knee, not end  stage.   OPERATION PERFORMED:  1.  Right total knee replacement, [employer] prosthesis.  Soft tissue      balancing with medial capsular release.  Posterior stabilized cemented      #11 femoral component.  Cemented #11 tibial component with posterior      stabilized 10 mm polyethylene Flex insert.  Cemented recessed 30 mm      patellar component.  2.  Intra-articular injection, left knee with Depo-Medrol and Marcaine.   SURGEON:  Loreta Ave, M.D.   ASSISTANT:  Genene Churn. Denton Meek.   ANESTHESIA:  General.   BLOOD LOSS:  Minimal.   TOURNIQUET TIME:  On the right, one hour and 30 minutes.   SPECIMENS:  Excised bone and soft tissue.   CULTURES:  None.   COMPLICATIONS:  None.   DRESSING:  Soft compressive with knee immobilizer.   DESCRIPTION OF PROCEDURE:  The patient was brought to the operating room and  after adequate anesthesia had been obtained, both knees examined.  Both had  varus alignment, right greater than left.  On the left, just about full  extension, flexion to 120 degrees.  On the right, 5 degree flexion  contracture, flexion a little bit better than 100 degrees.  Stable ligaments  both sides.  Under sterile technique, the left knee was injected intra-  articularly with Depo-Medrol 80 mg  and Marcaine 6 mL of 0.5%.  Band-Aid  applied.  Attention turned to the right.  Tourniquet applied, prepped and  draped in the usual sterile fashion.  Exsanguinated with elevation and  Esmarch.  Tourniquet inflated to 350 mmHg.  Straight incision above the  patella down to the tibial tubercle.  Skin and subcutaneous tissue divided.  Medial parapatellar arthrotomy from above the patella down next to the  tibial tubercle.  Knee exposed.  Loose bodies, hypertrophic synovitis,  adhesions all  debrided.  Extensive peri-articular spurs, loose bodies  removed.  Remnants of menisci, cruciate ligaments all excised.  Distal femur  exposed.  Intramedullary guide placed.  10 mm resection set at 5 degrees of  valgus.  Sized for a #11 component.  Jigs put in place.  Definitive cuts  made.  Attention turned to the tibia.  Tibial spine removed with a saw.  Intramedullary guide placed.  Proximal cut perpendicular to the shaft, 5  degree posterior slope cut removing 7 mm off the deficient medial side.  All  recesses examined.  All periarticular spurs.  All loose bodies removed.  Tibia sized to a #11 component.  Patella was then denuded of periarticular  spurs, sized, reamed and drilled for a 30 mm recessed component.  Trials put  in place throughout.  #11 on the femur, #11 on the tibia and a 30 mm on the  patella.  With a 10 mm posterior stabilized insert, I had full extension,  full flexion, nicely balanced knee with good patellofemoral tracking.  This  was after medial capsular release.  Tibia was marked for appropriate  rotation and reamed.  All trials removed.  Copious irrigation with a pulse  irrigating device.  Extra drill holes were made in the medial tibia to  ensure better cement fixation as it was very dense in that area from  degenerative change.  Cement prepared and placed on all components.  All  components appropriately hammered in place.  Polyethylene attached to the  tibia and the patellar  component cemented as well.  Excessive cement  removed.  Once the cement hardened, the knee was re-examined.  Full  extension, full flexion, nicely balanced knee, good stability and neutral  mechanical axis.  Wound irrigated.  Hemovac placed, brought out through  separate stab wound.  Arthrotomy closed with #1 Vicryl.  Skin and  subcutaneous tissue with Vicryl and staples.  Margins of wound and knee  injected with Marcaine and drain was clamped.  Sterile compressive dressing  applied.  Tourniquet deflated and removed.  Knee immobilizer applied.  Anesthesia reversed.  Brought to recovery room.  Tolerated surgery well.  No  complications.      Loreta Ave, M.D.  Electronically Signed     DFM/MEDQ  D:  03/24/2005  T:  03/24/2005  Job:  [id number]

## 2010-10-11 NOTE — Discharge Summary (Signed)
NAME:  Raymond Herrera, Raymond Herrera NO.:  [id number]   MEDICAL RECORD NO.:  [id number]          PATIENT TYPE:  INP   LOCATION:  5013                         FACILITY:  [facility]   PHYSICIAN:  Loreta Ave, M.D. DATE OF BIRTH:  1955/11/10   DATE OF ADMISSION:  03/24/2005  DATE OF DISCHARGE:  03/27/2005                                 DISCHARGE SUMMARY   FINAL DIAGNOSES:  1.  Status post right total knee replacement for end-stage degenerative      joint disease.  2.  Depressive disorder.  3.  Long-term use of anticoagulants.   HISTORY OF PRESENT ILLNESS:  A 55 year old white male with a history of end-  stage DJD, right knee, and chronic pain who presents to our office for a  preoperative evaluation for a total knee replacement.  He had progressively  worsening pain with failed response with conservative treatment.  Significant decrease in his daily activities due to the ongoing complaint.   PREADMISSION LABORATORIES:  WBC 8.5, hemoglobin 15.7, hematocrit 46.3,  platelets 236.  PT 13.1, INR 1.0, PTT 29.  Sodium 140, potassium 3.4,  chloride 107, CO2 of 27, glucose 119, BUN 11, creatinine 0.9, calcium 9.1,  total protein 6.6, albumin 3.7.  Urinalysis negative.  AST 31, ALT 43,  alkaline phosphatase 58, total bilateral 0.8.   HOSPITAL COURSE:  On March 24, 2005, the patient was taken to the [facility] operating room where a right total knee replacement procedure was  performed.  Surgeon Loreta Ave, M.D.  Assistant Dimple Casey, P.A.C.  Anesthesia general.  EBL minimal.  Tourniquet time 1 hour and 30 minutes.  One Hemovac drain placed.  No specimens.  There were no surgical or  anesthesia complications, and the patient was transferred to recovery in  stable condition.  On March 25, 2005, the patient was doing well with good  pain control.  Started pharmacy protocol Coumadin.  Vital signs were stable,  afebrile.  Hemoglobin was 13.2, INR 1.1.  Dressing clean, dry,  and intact.  Calf nontender.  Neurovascular intact distally.  Discontinued Foley.  On  March 26, 2005, the patient had good pain control.  No BM yet.  Positive  flatus.  No abdominal pain.  Temperature 97.7, pulse 108, blood pressure  113/32, respirations 16.  Hemoglobin 11.8, INR 1.1.  Wound looked good.  Staples were intact.  No signs of infection.  Hemovac drain pulled.  Calf  nontender.  Discontinued morphine PCA and O2.  Heplock __________.  On  March 27, 2005, doing very well.  Good hall ambulation and completed  stairs without difficulty.  Good bowel movement.  Temperature 98.2, pulse  112, respirations 17, blood pressure 137/64.  WBCs 6.3, hematocrit 32.5,  hemoglobin 11.2, platelets 213.  Sodium 140, potassium 3.6, chloride 105,  CO2 of 27, BUN 6, creatinine 0.9, glucose 110, INR 1.3.  Wound looked good.  Staples intact.  No signs of infection.  No drainage.  Calf nontender.  Neurovascularly intact.  The patient was ready for discharge home.   DISCHARGE MEDICATIONS:  1.  Percocet 5/325, 1-2 tablets p.o. q.4-6h. p.r.n. for pain.  2.  Robaxin 500 mg one tablet p.o. q.6h. p.r.n. for spasms.  3.  Pharmacy protocol Coumadin.  4.  Resume previous home medications.   CONDITION:  Good and stable.   DISPOSITION:  Discharge home.   INSTRUCTIONS:  The patient will work with home health, PT and OT to improve  ambulation and knee range of motion and strengthening.  Knee staples to be  removed 2 weeks postoperatively.  He will be on Coumadin x4 weeks  postoperatively for DVT prophylaxis.  Dressing changes p.r.n.  He will  follow up in the office 2 weeks postoperatively for recheck.  Return sooner  if needed.      Loreta Ave, M.D.  Electronically Signed     DFM/MEDQ  D:  05/12/2005  T:  05/12/2005  Job:  [id number]

## 2010-10-11 NOTE — Assessment & Plan Note (Signed)
[facility] OFFICE NOTE   Raymond Herrera, Raymond Herrera                 MRN:          [id number]  DATE:06/19/2006                            DOB:          Oct 05, 1955    This is a 55 year old gentleman here for a complete physical exam.  In  general, he is doing well.  He is status post bilateral total knee  replacements per Dr. Eulah Pont.  He had the right one done a little over a  year ago.  He had the left one done about four months ago.  He has  recovered completely and is now painfree.  He uses Ativan once in a  while for anxiety and is requesting refills.  He gets good results with  control of stomach acid with Nexium but would like to try a generic  alternative.  For other details of his past medical history, family  history, social history, habits, etc., refer to our last physical note  dated August 20, 2004.   ALLERGIES:  1. ULTRACET.  2. BEE STINGS.   CURRENT MEDICATIONS:  1. Vicodin 5/500 as needed.  2. Astelin nasal sprays b.i.d. as needed.  3. Ativan 1 mg as needed.  4. Nexium 20 mg as needed.   OBJECTIVE:  Height 6 foot 2 inches.  Weight 338.  BP 142/84, pulse 80  and regular.  In general, he remains overweight.  Skin is clear.  Eyes  are clears.  Ears are clear.  Pharynx is clear.  Neck is supple without  lymphadenopathy or masses.  Lungs are clear.  Cardiac regular rate and  rhythm without gallops, murmurs or rubs.  Distal pulses are full.  Abdomen is soft, normal bowel sounds, nontender.  No masses.  Renetta Chalk  is normal male.  Extremities have no clubbing, cyanosis, or edema.  Neurologic exam is grossly intact.  He is here for fasting labs on  June 15, 2006.  These were all within normal limits with the  exception of his lipid panel.  HDL is low at 39 and LDL is high at 122.   ASSESSMENT/PLAN:  1. Complete physical exam:  We talked about getting more exercise and      losing weight.  2.  Elevated blood pressure:  I think this can be controlled with diet      and exercise.  We will follow it closely.  3. Anxiety:  I refilled Ativan 1 mg to use q.6h. as needed, #60 with 5      refills.  4. GE reflux disease:  We will switch to omeprazole 20 mg 2 every      morning.  5. Allergic rhinitis, stable.  6. Hyperlipidemia:  We discussed changing his diet as above.     Tera Mater. Clent Ridges, MD  Electronically Signed   SAF/MedQ  DD: 06/20/2006  DT: 06/20/2006  Job #: [id number]

## 2010-10-11 NOTE — Op Note (Signed)
NAME:  Raymond Herrera, Raymond Herrera NO.:  [id number]   MEDICAL RECORD NO.:  [id number]          PATIENT TYPE:  INP   LOCATION:  5032                         FACILITY:  [facility]   PHYSICIAN:  Loreta Ave, M.D. DATE OF BIRTH:  [birth year]   DATE OF PROCEDURE:  DATE OF DISCHARGE:                                 OPERATIVE REPORT   PREOPERATIVE DIAGNOSES:  Incision of degenerative arthritis of left knee,  varus alignment, mild flexion contracture.   POSTOPERATIVE DIAGNOSES:  Incision of degenerative arthritis of left knee,  varus alignment, mild flexion contracture.   PROCEDURE:  1. Total knee replacement of the left knee.  [employer].  2. Cemented PEG #7 femoral component.  Posterior stabilized.  3. Cemented #7 tibial component with a 9 mm posterior stabilized      polyethylene insert.  4. Cemented resurfacing 35 x 10 mm patellar component, medial offset.  5. Soft tissue balancing with medial capsular release.  6. Minimal invasive system.   SURGEON:  Loreta Ave, M.D.   ASSISTANT:  Genene Churn. Barry Dienes, P.A. present throughout the case.   ANESTHESIA:  General.   ESTIMATED BLOOD LOSS:  Minimal.   TOURNIQUET TIME:  One hour, 45 minutes.   SPECIMENS:  None.   COUNTS:  None.   COMPLICATIONS:  None.   DRESSING:  Soft compressive with knee immobilizer.   DRAINS:  Hemovac x1.   PROCEDURE:  The patient brought to the operating room and placed on the  operating room and after adequate anesthesia had been obtained, the left  knee examined.  Very mild flexion contracture with further flexion of 100  degrees.  Varus alignment correctable to neutral.  The tourniquet applied  and prepped and draped in the usual sterile fashion.  Exsanguinated with  elevation.  Esmarch tourniquet inflated to 400 mmHg.  Straight incision  above the patellar down to the tibial tubercle.  Medial arthrotomy to the  superior medial border of the patella.  Fascia  splitting incision from there  preserving quadriceps tendon.  Knee exposed.  Exuberant spurs, lose bodies  throughout, all debrided.  ACL deficient chronically.  Remnants of menisci,  cruciate ligament, lose bodies and spurs, all removed.  Distal femur  exposed.  Intermedullary guide placed.  Resected 12 mm off the distal femur,  set at 5 degrees of valgus.  Matching mechanical axis.  Epicondylar axis  marked.  Incised with a #7 component.  Jig is put in placed and then cuts  made.  Trial put in placed and found to fit well.  Attention turned to the  tibia.  Appropriate retractors.  Extramedullary guide.  A 3-degree posterior  sulcus perpendicular to the shaft.  Size is a #7 component.  Trial is put in  place.  A #7 on the femur and #7 tibia.  With a 9-mm insert full extension,  full flexion, nicely balancing, good stability.  Marked for rotation and  then the tibia hand reamed.  Patellar prepared with removal of spurs and  then resection of the posterior 10 mm.  Sized and drilled for a 35  x 10 mm  component.  Trial is put back in place.  Excellent patellofemoral tracking  at completion of the trials.  All trials were removed.  Copious irrigation  with a  pulse irrigating device.  Cemented was prepared and placed on  components which were firmly seated.  Polyethelene attached to the tibia.  Knee reduced.  After the cemented, all excessive cement removed.  Knee  reexamined.  Full extension, full flexion, good patellofemoral stability,  normal mechanical axis and good stability.  Wound irrigated.  Hemovac  placed.  Arthrotomy closed with #1 Vicryl.  Skin and subcutaneous tissue  with Vicryl and staples.  Knee injected with Marcaine and Hemovac and clamp.  Sterile compression dressing applied.  Tourniquet was deflated and removed.  Knee immobilizer applied.  Anesthesia reversed.  Brought to recovery room.  Tolerated the surgery well.  No complications.      Loreta Ave, M.D.   Electronically Signed     DFM/MEDQ  D:  04/02/2006  T:  04/03/2006  Job:  [id number]

## 2010-11-17 ENCOUNTER — Other Ambulatory Visit: Payer: Self-pay | Admitting: Pulmonary Disease

## 2010-11-17 DIAGNOSIS — G4733 Obstructive sleep apnea (adult) (pediatric): Secondary | ICD-10-CM

## 2010-11-26 ENCOUNTER — Other Ambulatory Visit: Payer: Self-pay | Admitting: *Deleted

## 2010-11-26 ENCOUNTER — Other Ambulatory Visit: Payer: Self-pay | Admitting: Family Medicine

## 2010-11-26 MED ORDER — ESOMEPRAZOLE MAGNESIUM 40 MG PO CPDR: 40.0000 mg | DELAYED_RELEASE_CAPSULE | ORAL | Status: DC

## 2010-11-26 NOTE — Telephone Encounter (Signed)
Refill sent to pharmacy for Nexium, 30 day supply with message to contact the office for an appointment as pt was last seen 10/24/09.

## 2010-11-26 NOTE — Telephone Encounter (Signed)
Received request from pharmacy for Lorazepam 1mg  #60. Last office visit 10/24/09 and last refill 05/07/10. Please advise.

## 2010-11-26 NOTE — Telephone Encounter (Signed)
Pt notified and scheduled physical for 12/10/10 @ 8:30am.

## 2010-12-10 ENCOUNTER — Encounter: Payer: Self-pay | Admitting: Family Medicine

## 2010-12-10 ENCOUNTER — Ambulatory Visit (INDEPENDENT_AMBULATORY_CARE_PROVIDER_SITE_OTHER): Payer: BC Managed Care – PPO | Admitting: Family Medicine

## 2010-12-10 VITALS — BP 110/78 | HR 76 | Temp 98.0°F | Ht 74.0 in | Wt 360.0 lb

## 2010-12-10 DIAGNOSIS — R7309 Other abnormal glucose: Secondary | ICD-10-CM

## 2010-12-10 DIAGNOSIS — R739 Hyperglycemia, unspecified: Secondary | ICD-10-CM

## 2010-12-10 DIAGNOSIS — E291 Testicular hypofunction: Secondary | ICD-10-CM

## 2010-12-10 DIAGNOSIS — Z0000 Encounter for general adult medical examination without abnormal findings: Secondary | ICD-10-CM

## 2010-12-10 LAB — BASIC METABOLIC PANEL
CO2: 28 mEq/L (ref 19–32)
Calcium: 9 mg/dL (ref 8.4–10.5)
Creatinine, Ser: 0.8 mg/dL (ref 0.4–1.5)
Glucose, Bld: 116 mg/dL — ABNORMAL HIGH (ref 70–99)

## 2010-12-10 LAB — POCT URINALYSIS DIPSTICK
Blood, UA: NEGATIVE
Ketones, UA: NEGATIVE
Leukocytes, UA: NEGATIVE
Nitrite, UA: NEGATIVE
Protein, UA: NEGATIVE
pH, UA: 5.5

## 2010-12-10 LAB — LIPID PANEL
Cholesterol: 196 mg/dL (ref 0–200)
HDL: 37.5 mg/dL — ABNORMAL LOW (ref >39.00)
Triglycerides: 155 mg/dL — ABNORMAL HIGH (ref 0.0–149.0)

## 2010-12-10 LAB — TSH: TSH: 3.22 u[IU]/mL (ref 0.35–5.50)

## 2010-12-10 LAB — CBC WITH DIFFERENTIAL/PLATELET
Basophils Absolute: 0 10*3/uL (ref 0.0–0.1)
Eosinophils Absolute: 0.1 10*3/uL (ref 0.0–0.7)
HCT: 46.4 % (ref 39.0–52.0)
Lymphs Abs: 1.7 10*3/uL (ref 0.7–4.0)
MCHC: 33.9 g/dL (ref 30.0–36.0)
Monocytes Relative: 8.4 % (ref 3.0–12.0)
Platelets: 183 10*3/uL (ref 150.0–400.0)
RDW: 14 % (ref 11.5–14.6)

## 2010-12-10 LAB — HEPATIC FUNCTION PANEL
ALT: 26 U/L (ref 0–53)
Total Protein: 6.7 g/dL (ref 6.0–8.3)

## 2010-12-10 LAB — HEMOGLOBIN A1C: Hgb A1c MFr Bld: 6.1 % (ref 4.6–6.5)

## 2010-12-10 MED ORDER — TESTOSTERONE 20.25 MG/ACT (1.62%) TD GEL: 4.0000 | Freq: Every day | TRANSDERMAL | Status: DC

## 2010-12-10 MED ORDER — OXYCODONE-ACETAMINOPHEN 7.5-325 MG PO TABS: 1.0000 | ORAL_TABLET | Freq: Four times a day (QID) | ORAL | Status: DC | PRN

## 2010-12-10 MED ORDER — ESOMEPRAZOLE MAGNESIUM 40 MG PO CPDR: 40.0000 mg | DELAYED_RELEASE_CAPSULE | ORAL | Status: DC

## 2010-12-10 MED ORDER — LORAZEPAM 1 MG PO TABS: 1.0000 mg | ORAL_TABLET | Freq: Two times a day (BID) | ORAL | Status: DC

## 2010-12-10 NOTE — Progress Notes (Signed)
  Subjective:    Patient ID: Raymond Herrera, male    DOB: April 29, 1956, 55 y.o.   MRN: [id number]  HPI 55 yr old male for a cpx. He is doing well. His back pain is stable and manageable.    Review of Systems  Constitutional: Negative.   HENT: Negative.   Eyes: Negative.   Respiratory: Negative.   Cardiovascular: Negative.   Gastrointestinal: Negative.   Genitourinary: Negative.   Musculoskeletal: Positive for back pain. Negative for myalgias, joint swelling, arthralgias and gait problem.  Skin: Negative.   Neurological: Negative.   Hematological: Negative.   Psychiatric/Behavioral: Negative.        Objective:   Physical Exam  Constitutional: He is oriented to person, place, and time. He appears well-developed and well-nourished. No distress.       Obese   HENT:  Head: Normocephalic and atraumatic.  Right Ear: External ear normal.  Left Ear: External ear normal.  Nose: Nose normal.  Mouth/Throat: Oropharynx is clear and moist. No oropharyngeal exudate.  Eyes: Conjunctivae and EOM are normal. Pupils are equal, round, and reactive to light. Right eye exhibits no discharge. Left eye exhibits no discharge. No scleral icterus.  Neck: Neck supple. No JVD present. No tracheal deviation present. No thyromegaly present.  Cardiovascular: Normal rate, regular rhythm, normal heart sounds and intact distal pulses.  Exam reveals no gallop and no friction rub.   No murmur heard.      EKG normal   Pulmonary/Chest: Effort normal and breath sounds normal. No respiratory distress. He has no wheezes. He has no rales. He exhibits no tenderness.  Abdominal: Soft. Bowel sounds are normal. He exhibits no distension and no mass. There is no tenderness. There is no rebound and no guarding.  Genitourinary: Rectum normal, prostate normal and penis normal. Guaiac negative stool. No penile tenderness.  Musculoskeletal: Normal range of motion. He exhibits no edema and no tenderness.  Lymphadenopathy:   He has no cervical adenopathy.  Neurological: He is alert and oriented to person, place, and time. He has normal reflexes. No cranial nerve deficit. He exhibits normal muscle tone. Coordination normal.  Skin: Skin is warm and dry. No rash noted. He is not diaphoretic. No erythema. No pallor.  Psychiatric: He has a normal mood and affect. His behavior is normal. Judgment and thought content normal.          Assessment & Plan:  He needs to lose weight. Get fasting labs

## 2010-12-13 ENCOUNTER — Telehealth: Payer: Self-pay

## 2010-12-13 DIAGNOSIS — E291 Testicular hypofunction: Secondary | ICD-10-CM

## 2010-12-13 NOTE — Telephone Encounter (Signed)
Message copied by Beverely Low on Fri Dec 13, 2010  2:54 PM ------      Message from: Gershon Crane A      Created: Thu Dec 12, 2010  1:40 PM       Normal except his testosterone level is still very low despite using the gel. I think the only way to treat this adequately is with shots. Refer to Urology for hypogonadism.

## 2010-12-13 NOTE — Telephone Encounter (Signed)
Pt notified and referral placed.  

## 2011-02-05 ENCOUNTER — Ambulatory Visit: Payer: BC Managed Care – PPO | Admitting: Pulmonary Disease

## 2011-02-10 ENCOUNTER — Encounter: Payer: Self-pay | Admitting: Pulmonary Disease

## 2011-02-10 ENCOUNTER — Ambulatory Visit (INDEPENDENT_AMBULATORY_CARE_PROVIDER_SITE_OTHER): Payer: BC Managed Care – PPO | Admitting: Pulmonary Disease

## 2011-02-10 VITALS — BP 138/80 | HR 89 | Temp 98.3°F | Ht 73.0 in | Wt 380.6 lb

## 2011-02-10 DIAGNOSIS — G4733 Obstructive sleep apnea (adult) (pediatric): Secondary | ICD-10-CM

## 2011-02-10 NOTE — Patient Instructions (Signed)
Continue with cpap, and work on weight loss Will send an order to your dme to let you try nasal pillows. followup with me in one year.

## 2011-02-10 NOTE — Assessment & Plan Note (Signed)
The pt is doing well with cpap, and has seen significant improvement in his sleep and daytime alertness.  He wishes to try nasal pillows, and will send an order to his dme for this.  I have encouraged him to work on weight loss, and to f/u with me in one year.

## 2011-02-10 NOTE — Progress Notes (Signed)
  Subjective:    Patient ID: Raymond Herrera, male    DOB: 02/10/1956, 55 y.o.   MRN: [id number]  HPI The pt comes in today for f/u of his known osa.  He has been wearing cpap compliantly, and has seen tremendous difference in his sleep and daytime alertness.  He wants to try nasal pillows to see if more comfortable.  He denies any pressure issues.    Review of Systems  Constitutional: Negative for fever and unexpected weight change.  HENT: Positive for congestion and rhinorrhea. Negative for ear pain, nosebleeds, sore throat, sneezing, trouble swallowing, dental problem, postnasal drip and sinus pressure.   Eyes: Negative for redness and itching.  Respiratory: Negative for cough, chest tightness, shortness of breath and wheezing.   Cardiovascular: Positive for leg swelling. Negative for palpitations.  Gastrointestinal: Negative for nausea and vomiting.  Genitourinary: Negative for dysuria.  Musculoskeletal: Negative for joint swelling.  Skin: Negative for rash.  Neurological: Negative for headaches.  Hematological: Does not bruise/bleed easily.  Psychiatric/Behavioral: Negative for dysphoric mood. The patient is not nervous/anxious.        Objective:   Physical Exam Obese male in nad No skin breakdown or pressure necrosis from cpap mask LE without edema, no cyanosis noted.  Alert, not sleepy, moves all 4        Assessment & Plan:

## 2011-02-11 ENCOUNTER — Ambulatory Visit (INDEPENDENT_AMBULATORY_CARE_PROVIDER_SITE_OTHER): Payer: BC Managed Care – PPO | Admitting: Family Medicine

## 2011-02-11 DIAGNOSIS — M25579 Pain in unspecified ankle and joints of unspecified foot: Secondary | ICD-10-CM

## 2011-02-11 DIAGNOSIS — M25571 Pain in right ankle and joints of right foot: Secondary | ICD-10-CM

## 2011-02-11 DIAGNOSIS — M109 Gout, unspecified: Secondary | ICD-10-CM

## 2011-02-14 ENCOUNTER — Ambulatory Visit (INDEPENDENT_AMBULATORY_CARE_PROVIDER_SITE_OTHER): Payer: BC Managed Care – PPO | Admitting: Family Medicine

## 2011-02-14 ENCOUNTER — Encounter: Payer: Self-pay | Admitting: Family Medicine

## 2011-02-14 VITALS — BP 132/80 | HR 87 | Temp 98.3°F | Wt 380.0 lb

## 2011-02-14 DIAGNOSIS — L0292 Furuncle, unspecified: Secondary | ICD-10-CM

## 2011-02-14 DIAGNOSIS — L0293 Carbuncle, unspecified: Secondary | ICD-10-CM

## 2011-02-14 MED ORDER — DOXYCYCLINE HYCLATE 100 MG PO CAPS: 100.0000 mg | ORAL_CAPSULE | Freq: Two times a day (BID) | ORAL | Status: AC

## 2011-02-14 NOTE — Progress Notes (Signed)
  Subjective:    Patient ID: Raymond Herrera, male    DOB: 24-Jun-1955, 55 y.o.   MRN: [id number]  HPI Here for another breakout of boils in the left axilla. This started yesterday, and he has taken 2 doses of Doxycycline already. This represents the 5th such breakout in the past 6 months. He feels fine in general. Using a roll on deodorant.    Review of Systems  Constitutional: Negative.        Objective:   Physical Exam  Constitutional: He appears well-developed and well-nourished.  Skin:       The left axilla has 2 small tender boils          Assessment & Plan:  Recurrent boils. We will start on Doxycycline now for treatment, but we will stay on this for 6 months for prophylaxis. Then if he has had no further breakouts, we plan to come off it again.

## 2011-02-24 HISTORY — PX: RADIOFREQUENCY ABLATION: SHX2290

## 2011-03-29 NOTE — Progress Notes (Signed)
System Downtime Recovery The EMR experienced a system downtime.  This downtime occurred on 02-11-2011. During this downtime paper charting was completed by the provider.  The visit was documented on paper during the downtime and will be scanned into [facility], billing was completed by the [facility] Billing Department .  The visit is being closed on behalf of the provider. 

## 2011-05-06 ENCOUNTER — Other Ambulatory Visit: Payer: Self-pay | Admitting: Family Medicine

## 2011-05-06 NOTE — Telephone Encounter (Addendum)
Raymond Herrera with [employer], represents pt and they have questions concerning the copy of medical records rcvd. Have questions re: pt back and sleep apnea re: workmans compensation claim. Pt signed a medical records release for [employer] to rcv this info. Also need to know date of pts last visit, that is pertaining to pts back. If pt has been in to see pcp re: back issue, after 12/10/10, pls send those doctors notes as well.

## 2011-05-06 NOTE — Telephone Encounter (Signed)
Pt need new rx percocet.  

## 2011-05-07 MED ORDER — OXYCODONE-ACETAMINOPHEN 7.5-325 MG PO TABS: 1.0000 | ORAL_TABLET | Freq: Four times a day (QID) | ORAL | Status: DC | PRN

## 2011-05-07 NOTE — Telephone Encounter (Signed)
Spoke with pt and script is ready.

## 2011-05-07 NOTE — Telephone Encounter (Signed)
rx is done. They will need to talk to Medical records about the records

## 2011-05-07 NOTE — Telephone Encounter (Signed)
Pt called to check on getting his refill for Percocet. Pt is going out of town on [place] of this wk and will need to pick up script today. Pls call when ready for pick up.

## 2011-05-22 ENCOUNTER — Other Ambulatory Visit: Payer: Self-pay | Admitting: Family Medicine

## 2011-07-14 ENCOUNTER — Other Ambulatory Visit: Payer: Self-pay | Admitting: Family Medicine

## 2011-07-15 NOTE — Telephone Encounter (Signed)
Call in #60 with 5 rf 

## 2011-07-23 ENCOUNTER — Telehealth: Payer: Self-pay | Admitting: Family Medicine

## 2011-07-23 NOTE — Telephone Encounter (Signed)
Patient called stating he would like a refill on his percocet. Please advise.

## 2011-07-24 MED ORDER — OXYCODONE-ACETAMINOPHEN 7.5-325 MG PO TABS: 1.0000 | ORAL_TABLET | Freq: Four times a day (QID) | ORAL | Status: DC | PRN

## 2011-07-24 NOTE — Telephone Encounter (Signed)
Script is ready for pick up and left voice message for pt. 

## 2011-07-24 NOTE — Telephone Encounter (Signed)
done

## 2011-09-10 ENCOUNTER — Ambulatory Visit (INDEPENDENT_AMBULATORY_CARE_PROVIDER_SITE_OTHER): Payer: BC Managed Care – PPO | Admitting: Family Medicine

## 2011-09-10 ENCOUNTER — Encounter: Payer: Self-pay | Admitting: Family Medicine

## 2011-09-10 VITALS — BP 130/80 | HR 97 | Temp 98.9°F

## 2011-09-10 DIAGNOSIS — M545 Low back pain: Secondary | ICD-10-CM

## 2011-09-10 DIAGNOSIS — G8929 Other chronic pain: Secondary | ICD-10-CM

## 2011-09-10 MED ORDER — HYDROCODONE-ACETAMINOPHEN 10-325 MG PO TABS: 1.0000 | ORAL_TABLET | Freq: Four times a day (QID) | ORAL | Status: DC | PRN

## 2011-09-10 MED ORDER — METHOCARBAMOL 500 MG PO TABS: 500.0000 mg | ORAL_TABLET | Freq: Four times a day (QID) | ORAL | Status: AC

## 2011-09-10 MED ORDER — CELECOXIB 200 MG PO CAPS: 200.0000 mg | ORAL_CAPSULE | Freq: Two times a day (BID) | ORAL | Status: AC

## 2011-09-11 ENCOUNTER — Encounter: Payer: Self-pay | Admitting: Family Medicine

## 2011-09-11 NOTE — Progress Notes (Signed)
  Subjective:    Patient ID: Raymond Herrera, male    DOB: Jun 21, 1955, 56 y.o.   MRN: [id number]  HPI Here for med refills for his chronic low back pains. He had been seeing Dr. Sharolyn Douglas and the his partner, who is a Physical Medicine and Rehab specialist for a Workers Compensation injury which involved the lower back. He has received PT, steroid injections, and a radio frequency nerve ablation to the lower spine. The Workers Comp case was recently settled, and Raymond Herrera does not plan to see Dr. Noel Gerold any longer. He asks me to take over his medications. He has been taking Celebrex, Robaxin, and either Vicodin or Percocet for the pain. He is back to work full time.    Review of Systems  Constitutional: Negative.   Musculoskeletal: Positive for back pain.       Objective:   Physical Exam  Constitutional: He appears well-developed and well-nourished.  Musculoskeletal: Normal range of motion. He exhibits no edema and no tenderness.          Assessment & Plan:  I did refill his meds today as below. Recheck in 3 months

## 2011-09-30 ENCOUNTER — Encounter (HOSPITAL_COMMUNITY): Payer: Self-pay

## 2011-09-30 ENCOUNTER — Ambulatory Visit (HOSPITAL_COMMUNITY)
Admission: RE | Admit: 2011-09-30 | Discharge: 2011-09-30 | Disposition: A | Discharge status: Home / Self Care | Payer: BC Managed Care – PPO | Source: Ambulatory Visit | Attending: Surgery | Admitting: Surgery

## 2011-09-30 ENCOUNTER — Encounter (HOSPITAL_COMMUNITY)
Admission: RE | Admit: 2011-09-30 | Discharge: 2011-09-30 | Disposition: A | Discharge status: Home / Self Care | Payer: BC Managed Care – PPO | Source: Ambulatory Visit | Attending: Orthopedic Surgery | Admitting: Orthopedic Surgery

## 2011-09-30 ENCOUNTER — Encounter (HOSPITAL_COMMUNITY): Payer: Self-pay | Admitting: Respiratory Therapy

## 2011-09-30 HISTORY — DX: Depression, unspecified: F32.A

## 2011-09-30 HISTORY — DX: Major depressive disorder, single episode, unspecified: F32.9

## 2011-09-30 LAB — COMPREHENSIVE METABOLIC PANEL
Alkaline Phosphatase: 53 U/L (ref 39–117)
BUN: 10 mg/dL (ref 6–23)
Creatinine, Ser: 0.82 mg/dL (ref 0.50–1.35)
GFR calc Af Amer: >90 mL/min (ref >90)
Glucose, Bld: 95 mg/dL (ref 70–99)
Potassium: 3.7 mEq/L (ref 3.5–5.1)
Total Bilirubin: 0.8 mg/dL (ref 0.3–1.2)
Total Protein: 6.7 g/dL (ref 6.0–8.3)

## 2011-09-30 LAB — CBC
HCT: 46.3 % (ref 39.0–52.0)
Hemoglobin: 16.3 g/dL (ref 13.0–17.0)
MCHC: 35.2 g/dL (ref 30.0–36.0)
MCV: 85.9 fL (ref 78.0–100.0)
RDW: 13.8 % (ref 11.5–15.5)

## 2011-09-30 LAB — URINALYSIS, ROUTINE W REFLEX MICROSCOPIC
Glucose, UA: NEGATIVE mg/dL
Hgb urine dipstick: NEGATIVE
Ketones, ur: NEGATIVE mg/dL
Protein, ur: 30 mg/dL — AB
Urobilinogen, UA: 1 mg/dL (ref 0.0–1.0)

## 2011-09-30 LAB — SURGICAL PCR SCREEN
MRSA, PCR: NEGATIVE
Staphylococcus aureus: NEGATIVE

## 2011-09-30 LAB — URINE MICROSCOPIC-ADD ON

## 2011-09-30 LAB — PROTIME-INR
INR: 0.99 (ref 0.00–1.49)
Prothrombin Time: 13.3 seconds (ref 11.6–15.2)

## 2011-09-30 MED ORDER — CEFAZOLIN SODIUM-DEXTROSE 2-3 GM-% IV SOLR
2.0000 g | INTRAVENOUS | Status: AC
  Administered 2011-10-01: 2 g via INTRAVENOUS
  Filled 2011-09-30: qty 50

## 2011-09-30 NOTE — Pre-Procedure Instructions (Signed)
[address] Leemon Ayala Ventola  09/30/2011   Your procedure is scheduled on:  Wednesday, May 8th.  Report to [facility] at 9:30AM.  Call this number if you have problems the morning of surgery: [phone]   Remember:   Do not eat food:After Midnight.  May have clear liquids: up to 4 Hours before arrival.  (5:30am)  Clear liquids include soda, tea, black coffee, apple or grape juice, broth.   Take these medicines the morning of surgery with A SIP OF WATER: Nexium.  May take Ativan an Percocet and use Flonase if needed.   Do not wear jewelry, make-up or nail polish.  Do not wear lotions, powders, or perfumes. You may wear deodorant.  Do not shave 48 hours prior to surgery.  Do not bring valuables to the hospital.  Contacts, dentures or bridgework may not be worn into surgery.  Leave suitcase in the car. After surgery it may be brought to your room.  For patients admitted to the hospital, checkout time is 11:00 AM the day of discharge.   Patients discharged the day of surgery will not be allowed to drive home.  Name and phone number of your driver: --  Special Instructions: CHG Shower Use Special Wash: 1/2 bottle night before surgery and 1/2 bottle morning of surgery.   Please read over the following fact sheets that you were given: Pain Booklet, Coughing and Deep Breathing, Blood Transfusion Information, MRSA Information and Surgical Site Infection Prevention

## 2011-09-30 NOTE — H&P (Signed)
  [facility] [address]   [address] [place], [place] [address] [phone] A Division of [facility]  Loreta Ave, M.D.     Robert A. Thurston Hole, M.D.     Lunette Stands, M.D. Eulas Post, M.D.    Buford Dresser, M.D. Estell Harpin, M.D. Ralene Cork, D.O.          Genene Churn. Barry Dienes, PA-C            Kirstin A. Shepperson, PA-C Janace Litten, OPA-C   RE: [place], [place]   [phone]      DOB: Oct 12, 1955 PROGRESS NOTE: 09-30-11 Chief complaint: left knee pain. History of present illness: 56 year old white male with left knee pain. He is status post left total knee replacement by Dr. Eulah Pont 04/02/06, this was a [employer] triathlon prosthesis. I spoke with the patient last night when we were on call about left knee injury sustained earlier in the evening. He was in his garage and slipped falling directly onto the anterior left knee. States he had marked discomfort and swelling immediately with difficulty extending the knee. I advised him if he had that much discomfort to go to the ER but he elected to hold on that and come in today. He's status post right total knee replacement and is not having issues there. Current medications: Celebrex, Nexium, testosterone injections.  No known drug allergies. Past medical/surgical history: GERD bilateral total knee replacements.  Family history: noncontributory. Social history: he's married and employed in [employer], admits occasional alcohol use denies smoking.  Review of systems: negative.  EXAMINATION: Height 6'3" 360 pounds. Alert and oriented x3 in no acute distress. No increase in respiratory effort. Gait is antalgic due to left knee pain. Left knee has range of motion 10-90 degrees with marked discomfort. 3+ effusion. Diffuse knee tenderness. Collateral ligaments stable. Calf non-tender neurovascularly intact. Skin warm and dry.   X-RAYS: Left knee AP lateral and  sunrise views show femoral and tibial components intact. On sunrise view he has lateral tracking of the patella and it appears the poly has shifted, no obvious fracture.   IMPRESSION: Left knee unstable patellofemoral poly. Status post total knee replacement.  DISPOSITION: He's scheduled to go to the Operating Room tomorrow for total knee revision. Discussed risks benefits and possible complications and rehab time. All questions answered. Pre-op labs today.  Loreta Ave, M.D.  Electronically verified by Loreta Ave, M.D. DFM(JMO):kh D 09-29-01 T 09-29-01

## 2011-10-01 ENCOUNTER — Encounter (HOSPITAL_COMMUNITY): Payer: Self-pay | Admitting: Critical Care Medicine

## 2011-10-01 ENCOUNTER — Inpatient Hospital Stay (HOSPITAL_COMMUNITY)
Admission: RE | Admit: 2011-10-01 | Discharge: 2011-10-02 | DRG: 471 | Disposition: A | Discharge status: Home / Self Care | Payer: BC Managed Care – PPO | Source: Ambulatory Visit | Attending: Orthopedic Surgery | Admitting: Orthopedic Surgery

## 2011-10-01 ENCOUNTER — Encounter (HOSPITAL_COMMUNITY): Payer: Self-pay | Admitting: *Deleted

## 2011-10-01 ENCOUNTER — Encounter (HOSPITAL_COMMUNITY)
Admission: RE | Disposition: A | Discharge status: Home / Self Care | Payer: Self-pay | Source: Ambulatory Visit | Attending: Orthopedic Surgery

## 2011-10-01 ENCOUNTER — Inpatient Hospital Stay (HOSPITAL_COMMUNITY): Payer: BC Managed Care – PPO

## 2011-10-01 ENCOUNTER — Ambulatory Visit (HOSPITAL_COMMUNITY): Payer: BC Managed Care – PPO | Admitting: Critical Care Medicine

## 2011-10-01 DIAGNOSIS — Y831 Surgical operation with implant of artificial internal device as the cause of abnormal reaction of the patient, or of later complication, without mention of misadventure at the time of the procedure: Secondary | ICD-10-CM | POA: Diagnosis present

## 2011-10-01 DIAGNOSIS — Z96659 Presence of unspecified artificial knee joint: Secondary | ICD-10-CM

## 2011-10-01 DIAGNOSIS — Z4789 Encounter for other orthopedic aftercare: Secondary | ICD-10-CM

## 2011-10-01 DIAGNOSIS — T84019A Broken internal joint prosthesis, unspecified site, initial encounter: Principal | ICD-10-CM | POA: Diagnosis present

## 2011-10-01 DIAGNOSIS — K219 Gastro-esophageal reflux disease without esophagitis: Secondary | ICD-10-CM | POA: Diagnosis present

## 2011-10-01 DIAGNOSIS — W010XXA Fall on same level from slipping, tripping and stumbling without subsequent striking against object, initial encounter: Secondary | ICD-10-CM | POA: Diagnosis present

## 2011-10-01 DIAGNOSIS — Z01812 Encounter for preprocedural laboratory examination: Secondary | ICD-10-CM

## 2011-10-01 HISTORY — PX: TOTAL KNEE REVISION: SHX996

## 2011-10-01 SURGERY — TOTAL KNEE REVISION
Anesthesia: General | Site: Knee | Laterality: Left | Wound class: Clean

## 2011-10-01 MED ORDER — FLUTICASONE PROPIONATE 50 MCG/ACT NA SUSP
1.0000 | Freq: Every day | NASAL | Status: DC
  Administered 2011-10-01: 1 via NASAL
  Filled 2011-10-01: qty 16

## 2011-10-01 MED ORDER — DEXTROSE 5 % IV SOLN
500.0000 mg | INTRAVENOUS | Status: AC
  Administered 2011-10-01: 500 mg via INTRAVENOUS
  Filled 2011-10-01: qty 5

## 2011-10-01 MED ORDER — PHENOL 1.4 % MT LIQD: 1.0000 | OROMUCOSAL | Status: DC | PRN

## 2011-10-01 MED ORDER — POTASSIUM CHLORIDE IN NACL 20-0.9 MEQ/L-% IV SOLN
INTRAVENOUS | Status: DC
  Administered 2011-10-01: 22:00:00 via INTRAVENOUS
  Filled 2011-10-01 (×4): qty 1000

## 2011-10-01 MED ORDER — OXYCODONE-ACETAMINOPHEN 5-325 MG PO TABS
1.0000 | ORAL_TABLET | ORAL | Status: DC | PRN
  Administered 2011-10-01 – 2011-10-02 (×4): 2 via ORAL
  Filled 2011-10-01 (×5): qty 2

## 2011-10-01 MED ORDER — MENTHOL 3 MG MT LOZG: 1.0000 | LOZENGE | OROMUCOSAL | Status: DC | PRN

## 2011-10-01 MED ORDER — FENTANYL CITRATE 0.05 MG/ML IJ SOLN
INTRAMUSCULAR | Status: DC | PRN
  Administered 2011-10-01 (×2): 50 ug via INTRAVENOUS
  Administered 2011-10-01: 25 ug via INTRAVENOUS
  Administered 2011-10-01: 50 ug via INTRAVENOUS
  Administered 2011-10-01: 100 ug via INTRAVENOUS
  Administered 2011-10-01: 50 ug via INTRAVENOUS
  Administered 2011-10-01: 25 ug via INTRAVENOUS
  Administered 2011-10-01: 50 ug via INTRAVENOUS

## 2011-10-01 MED ORDER — PANTOPRAZOLE SODIUM 40 MG PO TBEC
40.0000 mg | DELAYED_RELEASE_TABLET | Freq: Every day | ORAL | Status: DC
  Administered 2011-10-02: 40 mg via ORAL
  Filled 2011-10-01: qty 1

## 2011-10-01 MED ORDER — NEOSTIGMINE METHYLSULFATE 1 MG/ML IJ SOLN
INTRAMUSCULAR | Status: DC | PRN
  Administered 2011-10-01: 3 mg via INTRAVENOUS

## 2011-10-01 MED ORDER — GLYCOPYRROLATE 0.2 MG/ML IJ SOLN
INTRAMUSCULAR | Status: DC | PRN
  Administered 2011-10-01: 0.4 mg via INTRAVENOUS

## 2011-10-01 MED ORDER — BUPIVACAINE HCL (PF) 0.25 % IJ SOLN
INTRAMUSCULAR | Status: DC | PRN
  Administered 2011-10-01: 30 mL

## 2011-10-01 MED ORDER — METHOCARBAMOL 500 MG PO TABS
500.0000 mg | ORAL_TABLET | Freq: Four times a day (QID) | ORAL | Status: DC | PRN
  Administered 2011-10-01 – 2011-10-02 (×3): 500 mg via ORAL
  Filled 2011-10-01 (×3): qty 1

## 2011-10-01 MED ORDER — METOCLOPRAMIDE HCL 10 MG PO TABS: 5.0000 mg | ORAL_TABLET | Freq: Three times a day (TID) | ORAL | Status: DC | PRN

## 2011-10-01 MED ORDER — ONDANSETRON HCL 4 MG/2ML IJ SOLN
INTRAMUSCULAR | Status: DC | PRN
  Administered 2011-10-01: 4 mg via INTRAVENOUS

## 2011-10-01 MED ORDER — MORPHINE SULFATE 4 MG/ML IJ SOLN
INTRAMUSCULAR | Status: DC | PRN
  Administered 2011-10-01: 4 mg via INTRAVENOUS

## 2011-10-01 MED ORDER — LIDOCAINE HCL (CARDIAC) 20 MG/ML IV SOLN
INTRAVENOUS | Status: DC | PRN
  Administered 2011-10-01: 100 mg via INTRAVENOUS

## 2011-10-01 MED ORDER — ONDANSETRON HCL 4 MG PO TABS: 4.0000 mg | ORAL_TABLET | Freq: Four times a day (QID) | ORAL | Status: DC | PRN

## 2011-10-01 MED ORDER — LORAZEPAM 1 MG PO TABS
1.0000 mg | ORAL_TABLET | Freq: Two times a day (BID) | ORAL | Status: DC | PRN
  Administered 2011-10-02: 1 mg via ORAL
  Filled 2011-10-01 (×2): qty 1

## 2011-10-01 MED ORDER — METOCLOPRAMIDE HCL 5 MG/ML IJ SOLN: 5.0000 mg | Freq: Three times a day (TID) | INTRAMUSCULAR | Status: DC | PRN

## 2011-10-01 MED ORDER — HYDROMORPHONE HCL PF 1 MG/ML IJ SOLN
INTRAMUSCULAR | Status: AC
  Filled 2011-10-01: qty 1

## 2011-10-01 MED ORDER — METHOCARBAMOL 100 MG/ML IJ SOLN
500.0000 mg | Freq: Four times a day (QID) | INTRAVENOUS | Status: DC | PRN
  Filled 2011-10-01: qty 5

## 2011-10-01 MED ORDER — HYDROMORPHONE HCL PF 1 MG/ML IJ SOLN
0.2500 mg | INTRAMUSCULAR | Status: DC | PRN
  Administered 2011-10-01 (×6): 0.5 mg via INTRAVENOUS

## 2011-10-01 MED ORDER — ONDANSETRON HCL 4 MG/2ML IJ SOLN: 4.0000 mg | Freq: Once | INTRAMUSCULAR | Status: DC | PRN

## 2011-10-01 MED ORDER — LACTATED RINGERS IV SOLN
INTRAVENOUS | Status: DC | PRN
  Administered 2011-10-01 (×2): via INTRAVENOUS

## 2011-10-01 MED ORDER — HYDROMORPHONE HCL PF 1 MG/ML IJ SOLN
0.5000 mg | INTRAMUSCULAR | Status: DC | PRN
  Administered 2011-10-01 – 2011-10-02 (×6): 1 mg via INTRAVENOUS
  Filled 2011-10-01 (×6): qty 1

## 2011-10-01 MED ORDER — ONDANSETRON HCL 4 MG/2ML IJ SOLN: 4.0000 mg | Freq: Four times a day (QID) | INTRAMUSCULAR | Status: DC | PRN

## 2011-10-01 MED ORDER — ROCURONIUM BROMIDE 100 MG/10ML IV SOLN
INTRAVENOUS | Status: DC | PRN
  Administered 2011-10-01: 50 mg via INTRAVENOUS

## 2011-10-01 MED ORDER — PROPOFOL 10 MG/ML IV EMUL
INTRAVENOUS | Status: DC | PRN
  Administered 2011-10-01: 300 mg via INTRAVENOUS

## 2011-10-01 MED ORDER — ACETAMINOPHEN 325 MG PO TABS: 650.0000 mg | ORAL_TABLET | Freq: Four times a day (QID) | ORAL | Status: DC | PRN

## 2011-10-01 MED ORDER — MIDAZOLAM HCL 2 MG/2ML IJ SOLN
INTRAMUSCULAR | Status: AC
  Filled 2011-10-01: qty 2

## 2011-10-01 MED ORDER — SODIUM CHLORIDE 0.9 % IR SOLN
Status: DC | PRN
  Administered 2011-10-01: 1000 mL

## 2011-10-01 MED ORDER — MIDAZOLAM HCL 2 MG/2ML IJ SOLN
1.0000 mg | INTRAMUSCULAR | Status: DC | PRN
  Administered 2011-10-01: 2 mg via INTRAVENOUS

## 2011-10-01 MED ORDER — LACTATED RINGERS IV SOLN
INTRAVENOUS | Status: DC
  Administered 2011-10-01: 12:00:00 via INTRAVENOUS

## 2011-10-01 MED ORDER — ACETAMINOPHEN 650 MG RE SUPP: 650.0000 mg | Freq: Four times a day (QID) | RECTAL | Status: DC | PRN

## 2011-10-01 MED ORDER — LABETALOL HCL 5 MG/ML IV SOLN
INTRAVENOUS | Status: DC | PRN
  Administered 2011-10-01 (×2): 2.5 mg via INTRAVENOUS

## 2011-10-01 MED ORDER — SUCCINYLCHOLINE CHLORIDE 20 MG/ML IJ SOLN
INTRAMUSCULAR | Status: DC | PRN
  Administered 2011-10-01: 150 mg via INTRAVENOUS

## 2011-10-01 MED ORDER — DOCUSATE SODIUM 100 MG PO CAPS
100.0000 mg | ORAL_CAPSULE | Freq: Two times a day (BID) | ORAL | Status: DC
  Administered 2011-10-01 – 2011-10-02 (×2): 100 mg via ORAL
  Filled 2011-10-01 (×3): qty 1

## 2011-10-01 SURGICAL SUPPLY — 60 items
BANDAGE ELASTIC 4 VELCRO ST LF (GAUZE/BANDAGES/DRESSINGS) ×2 IMPLANT
BANDAGE ELASTIC 6 VELCRO ST LF (GAUZE/BANDAGES/DRESSINGS) ×2 IMPLANT
BANDAGE ESMARK 6X9 LF (GAUZE/BANDAGES/DRESSINGS) ×1 IMPLANT
BEARING TIBIAL INSERT SZ7 (Orthopedic Implant) ×1 IMPLANT
BLADE SAG 18X100X1.27 (BLADE) ×2 IMPLANT
BLADE SAW SGTL 13.0X1.19X90.0M (BLADE) IMPLANT
BLADE SURG ROTATE 9660 (MISCELLANEOUS) IMPLANT
BNDG ESMARK 6X9 LF (GAUZE/BANDAGES/DRESSINGS) ×2
BOOTCOVER CLEANROOM LRG (PROTECTIVE WEAR) ×2 IMPLANT
BOWL SMART MIX CTS (DISPOSABLE) ×2 IMPLANT
CEMENT BONE SIMPLEX SPEEDSET (Cement) ×2 IMPLANT
CLOTH BEACON ORANGE TIMEOUT ST (SAFETY) ×2 IMPLANT
COVER BACK TABLE 24X17X13 BIG (DRAPES) IMPLANT
COVER SURGICAL LIGHT HANDLE (MISCELLANEOUS) ×2 IMPLANT
CUFF TOURNIQUET SINGLE 34IN LL (TOURNIQUET CUFF) ×2 IMPLANT
DRAPE EXTREMITY T 121X128X90 (DRAPE) ×2 IMPLANT
DRAPE U-SHAPE 47X51 STRL (DRAPES) ×2 IMPLANT
DRSG PAD ABDOMINAL 8X10 ST (GAUZE/BANDAGES/DRESSINGS) ×2 IMPLANT
DURAPREP 26ML APPLICATOR (WOUND CARE) ×2 IMPLANT
ELECT REM PT RETURN 9FT ADLT (ELECTROSURGICAL) ×2
ELECTRODE REM PT RTRN 9FT ADLT (ELECTROSURGICAL) ×1 IMPLANT
EVACUATOR 1/8 PVC DRAIN (DRAIN) ×2 IMPLANT
FACESHIELD LNG OPTICON STERILE (SAFETY) ×6 IMPLANT
GAUZE XEROFORM 1X8 LF (GAUZE/BANDAGES/DRESSINGS) ×2 IMPLANT
GLOVE BIOGEL PI IND STRL 8 (GLOVE) ×1 IMPLANT
GLOVE BIOGEL PI INDICATOR 8 (GLOVE) ×1
GLOVE ORTHO TXT STRL SZ7.5 (GLOVE) ×4 IMPLANT
GOWN STRL NON-REIN LRG LVL3 (GOWN DISPOSABLE) ×4 IMPLANT
HANDPIECE INTERPULSE COAX TIP (DISPOSABLE) ×1
KIT BASIN OR (CUSTOM PROCEDURE TRAY) ×2 IMPLANT
KIT ROOM TURNOVER OR (KITS) ×2 IMPLANT
MANIFOLD NEPTUNE II (INSTRUMENTS) ×2 IMPLANT
NEEDLE HYPO 22GX1.5 SAFETY (NEEDLE) ×2 IMPLANT
NS IRRIG 1000ML POUR BTL (IV SOLUTION) ×2 IMPLANT
PACK TOTAL JOINT (CUSTOM PROCEDURE TRAY) ×2 IMPLANT
PAD ARMBOARD 7.5X6 YLW CONV (MISCELLANEOUS) ×2 IMPLANT
PAD CAST 4YDX4 CTTN HI CHSV (CAST SUPPLIES) ×1 IMPLANT
PADDING CAST COTTON 4X4 STRL (CAST SUPPLIES) ×1
PADDING CAST COTTON 6X4 STRL (CAST SUPPLIES) ×2 IMPLANT
PATELLA ASYMMETRIC 38X11 (Knees) ×2 IMPLANT
SET HNDPC FAN SPRY TIP SCT (DISPOSABLE) ×1 IMPLANT
SPONGE GAUZE 4X4 12PLY (GAUZE/BANDAGES/DRESSINGS) ×2 IMPLANT
STAPLER VISISTAT 35W (STAPLE) ×2 IMPLANT
SUCTION FRAZIER TIP 10 FR DISP (SUCTIONS) IMPLANT
SUT VIC AB 0 CT1 27 (SUTURE)
SUT VIC AB 0 CT1 27XBRD ANBCTR (SUTURE) IMPLANT
SUT VIC AB 1 CTX 36 (SUTURE) ×2
SUT VIC AB 1 CTX36XBRD ANBCTR (SUTURE) ×2 IMPLANT
SUT VIC AB 2-0 FS1 27 (SUTURE) ×4 IMPLANT
SUT VIC AB 2-0 SH 27 (SUTURE)
SUT VIC AB 2-0 SH 27XBRD (SUTURE) IMPLANT
SUT VIC AB 3-0 SH 27 (SUTURE)
SUT VIC AB 3-0 SH 27X BRD (SUTURE) IMPLANT
SYR CONTROL 10ML LL (SYRINGE) ×2 IMPLANT
TIBIAL BEARING INSERT SZ7 (Orthopedic Implant) ×2 IMPLANT
TOWEL OR 17X24 6PK STRL BLUE (TOWEL DISPOSABLE) ×2 IMPLANT
TOWEL OR 17X26 10 PK STRL BLUE (TOWEL DISPOSABLE) ×2 IMPLANT
TRAY FOLEY CATH 14FR (SET/KITS/TRAYS/PACK) IMPLANT
TUBE ANAEROBIC SPECIMEN COL (MISCELLANEOUS) IMPLANT
WATER STERILE IRR 1000ML POUR (IV SOLUTION) IMPLANT

## 2011-10-01 NOTE — Brief Op Note (Signed)
10/01/2011  2:58 PM  PATIENT:  Raymond Herrera  56 y.o. male  PRE-OPERATIVE DIAGNOSIS: left knee traumatic fracture of patellar and tibial components  POST-OPERATIVE DIAGNOSIS:  traumatic fracture of patellar and tibial components  PROCEDURE:  Procedure(s) (LRB): TOTAL KNEE REVISION (Left)  SURGEON:  Surgeon(s) and Role:    * Loreta Ave, MD - Primary  PHYSICIAN ASSISTANT: Zonia Kief M    ANESTHESIA:   regional and general  EBL:  Total I/O In: 1300 [I.V.:1300] Out: 50 [Blood:50]  SPECIMEN:  No Specimen  DISPOSITION OF SPECIMEN:  N/A  COUNTS:  YES  TOURNIQUET:   Total Tourniquet Time Documented: Thigh (Left) - 79 minutes  PATIENT DISPOSITION:  PACU - hemodynamically stable.

## 2011-10-01 NOTE — Transfer of Care (Signed)
Immediate Anesthesia Transfer of Care Note  Patient: Raymond Herrera  Procedure(s) Performed: Procedure(s) (LRB): TOTAL KNEE REVISION (Left)  Patient Location: PACU  Anesthesia Type: General  Level of Consciousness: awake, alert  and oriented  Airway & Oxygen Therapy: Patient Spontanous Breathing and Patient connected to nasal cannula oxygen  Post-op Assessment: Report given to PACU RN, Post -op Vital signs reviewed and stable and Patient moving all extremities X 4  Post vital signs: Reviewed and stable  Complications: No apparent anesthesia complications

## 2011-10-01 NOTE — Anesthesia Procedure Notes (Addendum)
Anesthesia Regional Block:  Femoral nerve block  Pre-Anesthetic Checklist: ,, timeout performed, Correct Patient, Correct Site, Correct Laterality, Correct Procedure, Correct Position, site marked, Risks and benefits discussed,  Surgical consent,  Pre-op evaluation,  At surgeon's request and post-op pain management  Laterality: Left  Prep: Maximum Sterile Barrier Precautions used, chloraprep and alcohol swabs       Needles:  Injection technique: Single-shot  Needle Type: Stimulator Needle - 80          Additional Needles:  Procedures: nerve stimulator Femoral nerve block  Nerve Stimulator or Paresthesia:  Response: 0.5 mA, 0.1 ms, 6 cm  Additional Responses:   Narrative:  Start time: 10/01/2011 11:55 AM End time: 10/01/2011 12:03 PM Injection made incrementally with aspirations every 5 mL.  Performed by: Personally  Anesthesiologist: Maren Beach MD  Additional Notes: 25cc 0.5% Marcaine w/ epi w/o difficulty or discomfort. GES   Procedure Name: Intubation Date/Time: 10/01/2011 12:49 PM Performed by: Leona Singleton A Pre-anesthesia Checklist: Patient identified Patient Re-evaluated:Patient Re-evaluated prior to inductionOxygen Delivery Method: Circle system utilized Preoxygenation: Pre-oxygenation with 100% oxygen Intubation Type: IV induction Ventilation: Mask ventilation without difficulty Laryngoscope Size: Miller and 2 Grade View: Grade I Tube type: Oral Tube size: 7.5 mm Number of attempts: 1 Airway Equipment and Method: Stylet Placement Confirmation: ETT inserted through vocal cords under direct vision,  positive ETCO2 and breath sounds checked- equal and bilateral Secured at: 22 cm Tube secured with: Tape Dental Injury: Teeth and Oropharynx as per pre-operative assessment

## 2011-10-01 NOTE — Anesthesia Preprocedure Evaluation (Addendum)
Anesthesia Evaluation  Patient identified by MRN, date of birth, ID band Patient awake    Reviewed: Allergy & Precautions, H&P , NPO status , Patient's Chart, lab work & pertinent test results  History of Anesthesia Complications Negative for: history of anesthetic complications  Airway Mallampati: II TM Distance: >3 FB Neck ROM: full    Dental  (+) Dental Advisory Given   Pulmonary sleep apnea and Continuous Positive Airway Pressure Ventilation ,          Cardiovascular negative cardio ROS  Rhythm:regular Rate:Normal     Neuro/Psych PSYCHIATRIC DISORDERS Anxiety Depression negative neurological ROS     GI/Hepatic Neg liver ROS, GERD-  Medicated and Controlled,  Endo/Other  Morbid obesity  Renal/GU negative Renal ROS     Musculoskeletal  (+) Arthritis -, Osteoarthritis,    Abdominal (+) + obese,   Peds  Hematology negative hematology ROS (+)   Anesthesia Other Findings   Reproductive/Obstetrics                         Anesthesia Physical Anesthesia Plan  ASA: III  Anesthesia Plan: General   Post-op Pain Management:    Induction: Intravenous  Airway Management Planned: Oral ETT  Additional Equipment:   Intra-op Plan:   Post-operative Plan: Extubation in OR  Informed Consent: I have reviewed the patients History and Physical, chart, labs and discussed the procedure including the risks, benefits and alternatives for the proposed anesthesia with the patient or authorized representative who has indicated his/her understanding and acceptance.     Plan Discussed with: CRNA, Anesthesiologist and Surgeon  Anesthesia Plan Comments:         Anesthesia Quick Evaluation

## 2011-10-01 NOTE — Preoperative (Addendum)
Beta Blockers   Reason not to administer Beta Blockers:Not Applicable 

## 2011-10-01 NOTE — Anesthesia Postprocedure Evaluation (Signed)
  Anesthesia Post-op Note  Patient: Raymond Herrera  Procedure(s) Performed: Procedure(s) (LRB): TOTAL KNEE REVISION (Left)  Patient Location: PACU  Anesthesia Type: General with post op pain block  Level of Consciousness: awake, alert , oriented and patient cooperative  Airway and Oxygen Therapy: Patient Spontanous Breathing and Patient connected to nasal cannula oxygen  Post-op Pain: mild  Post-op Assessment: Post-op Vital signs reviewed, Patient's Cardiovascular Status Stable, Respiratory Function Stable, Patent Airway, No signs of Nausea or vomiting and Pain level controlled  Post-op Vital Signs: Reviewed and stable  Complications: No apparent anesthesia complications

## 2011-10-01 NOTE — Interval H&P Note (Signed)
History and Physical Interval Note:  10/01/2011 8:19 AM  Raymond Herrera  has presented today for surgery, with the diagnosis of loose patella poly  The various methods of treatment have been discussed with the patient and family. After consideration of risks, benefits and other options for treatment, the patient has consented to  Procedure(s) (LRB): TOTAL KNEE REVISION (Left) as a surgical intervention .  The patients' history has been reviewed, patient examined, no change in status, stable for surgery.  I have reviewed the patients' chart and labs.  Questions were answered to the patient's satisfaction.     Anaise Sterbenz F

## 2011-10-02 LAB — CBC
HCT: 44.2 % (ref 39.0–52.0)
Hemoglobin: 14.7 g/dL (ref 13.0–17.0)
MCH: 29.6 pg (ref 26.0–34.0)
MCHC: 33.3 g/dL (ref 30.0–36.0)
MCV: 89.1 fL (ref 78.0–100.0)
RBC: 4.96 MIL/uL (ref 4.22–5.81)

## 2011-10-02 LAB — BASIC METABOLIC PANEL
BUN: 7 mg/dL (ref 6–23)
CO2: 29 mEq/L (ref 19–32)
Calcium: 8 mg/dL — ABNORMAL LOW (ref 8.4–10.5)
Glucose, Bld: 142 mg/dL — ABNORMAL HIGH (ref 70–99)
Sodium: 138 mEq/L (ref 135–145)

## 2011-10-02 MED ORDER — ASPIRIN 81 MG PO CHEW
81.0000 mg | CHEWABLE_TABLET | Freq: Every day | ORAL | Status: DC
  Filled 2011-10-02: qty 1

## 2011-10-02 NOTE — Progress Notes (Signed)
Subjective: Doing well.  Pain controlled.  Wants to go home today.   Objective: Vital signs in last 24 hours: Temp:  [97.7 F (36.5 C)-98.7 F (37.1 C)] 98.6 F (37 C) (05/09 0545) Pulse Rate:  [78-100] 100  (05/09 0545) Resp:  [10-20] 18  (05/09 0545) BP: (105-181)/(72-100) 136/75 mmHg (05/09 0545) SpO2:  [94 %-98 %] 96 % (05/09 0545)  Intake/Output from previous day: 05/08 0701 - 05/09 0700 In: 1740 [P.O.:240; I.V.:1500] Out: 1250 [Urine:1200; Blood:50] Intake/Output this shift:     Basename 10/02/11 0522 09/30/11 1208  HGB 14.7 16.3    Basename 10/02/11 0522 09/30/11 1208  WBC 7.4 8.1  RBC 4.96 5.39  HCT 44.2 46.3  PLT 175 197    Basename 10/02/11 0522 09/30/11 1208  NA 138 138  K 3.7 3.7  CL 102 105  CO2 29 23  BUN 7 10  CREATININE 0.91 0.82  GLUCOSE 142* 95  CALCIUM 8.0* 8.7    Basename 09/30/11 1208  LABPT --  INR 0.99    Exam:  Dressing c/d/i.  Calf nt, nvi.    Assessment/Plan: D/c home today if ambutates well with therapy.     Patricia Fargo M 10/02/2011, 8:21 AM

## 2011-10-02 NOTE — Care Management Note (Signed)
    Page 1 of 1   10/02/2011     2:24:11 PM   CARE MANAGEMENT NOTE 10/02/2011  Patient:  Raymond Herrera, Raymond Herrera   Account Number:  [id number]  Date Initiated:  10/02/2011  Documentation initiated by:  Anette Guarneri  Subjective/Objective Assessment:   s/p fall requiring left TKA revision  independent mobility PTA  Lives at home w/spouse, DME from previous knee surgeries  Per PT/OT eval no HH needs     Action/Plan:   Discharge home self care  per patient no DME needed   Anticipated DC Date:  10/02/2011   Anticipated DC Plan:  HOME/SELF CARE         Choice offered to / List presented to:             Status of service:  Completed, signed off Medicare Important Message given?  NO (If response is "NO", the following Medicare IM given date fields will be blank) Date Medicare IM given:   Date Additional Medicare IM given:    Discharge Disposition:  HOME/SELF CARE  Per UR Regulation:  Reviewed for med. necessity/level of care/duration of stay  If discussed at Long Length of Stay Meetings, dates discussed:    Comments:

## 2011-10-02 NOTE — Op Note (Signed)
NAME:  Raymond Herrera, Raymond Herrera NO.:  [id number]  MEDICAL RECORD NO.:  [id number]  LOCATION:  5008                         FACILITY:  [facility]  PHYSICIAN:  Loreta Ave, M.D. DATE OF BIRTH:  [birth year]  DATE OF PROCEDURE:  10/01/2011 DATE OF DISCHARGE:  10/02/2011                              OPERATIVE REPORT   PREOPERATIVE DIAGNOSES:  Status post left total knee replacement, with the new traumatic injury with a completely sheared off dislodged patellar component.  POSTOPERATIVE DIAGNOSES:  Status post left total knee replacement, with the new traumatic injury with a completely sheared off dislodged patellar component.  Also disruption of the polyethylene post of the tibial component with moderate wear on the polyethylene tibial component.  No evidence of loosening of other metallic components.  PROCEDURE:  Left knee exam under anesthesia.  Exploration with revision of total knee replacement.  Complete removal of patellar component and all fragments.  Revised with a pegged cemented medial offset 38 mm x 11 mm component.  Revision of tibial component with complete excision of polyethylene and replacement with a 9-mm size #7 Triathlon posterior stabilized component.  SURGEON:  Loreta Ave, M.D.  ASSISTANT:  Genene Churn. Denton Meek., present throughout the entire case, necessary for timely completion of procedure.  ANESTHESIA:  General.  BLOOD LOSS:  Minimal.  SPECIMENS:  None.  COUNTS:  None.  COMPLICATION:  None.  DRESSINGS:  Soft compressive.  TOURNIQUET TIME:  1 hour.  PROCEDURE:  The patient was brought to the operating room, placed on the operating table in supine position.  After anesthesia had been obtained, knee examined.  Collateral stable.  Obvious disruption in extensor mechanism.  You could palpate the polyethylene piece of the patella, which had subluxed out on the medial side.  This could be push back under the patella.  There was no gross  disruption of patella stability and had still reasonable tracking.  Tourniquet applied.  Prepped and draped in usual sterile fashion.  Exsanguinated with elevation, Esmarch. Tourniquet inflated to 350 mmHg.  Previous incision midline was opened. Skin and subcutaneous tissue divided.  Hemostasis with cautery.  Medial arthrotomy, vastus splitting, preserving quad tendon.  A very generous hemarthrosis, all drained out.  The knee exposed.  I had a little bit of the capsule release down to the tibia.  The main fragment of the patella was one large piece, which was removed.  This was then sheared off.  One peg broken off and found free in the joint removed.  The other 2 pegs were still buried in the patella.  Fragments of cement around that area also debrided out and removed.  The patella was exposed.  I took down another millimeter with a saw so I got down to good bony surface.  Drill holes were recreated for a 38-mm component.  This removed most of the polyethylene pegs and remaining cement in the patella and gave me a nice surface for the new component.  After appropriate trials, I decided to utilize a 38-mm component.  With the new trial in place, I had good tracking, good stability, and really no disruption or instability of patellofemoral joint.  The femoral component below looked good.  Well  fixed.  The post on the tibia, I had an indentation into it, where it looked like it had hit against the femoral component with this new trauma, perhaps with hyperextension or pivoting.  I had enough damage to remove that.  That was removed with osteotome and completely excised. All recess of the knee examined.  Some adhesions, some new debris from this new injury all cleared out.  The metallic component on the tibia was well fixed.  After appropriate trials, I decided to utilize a 9 x 7 component.  The tibial baseplate cleared out.  The new #9 insert was inserted down and then locked in place.  With  this, I had full extension, full flexion, good alignment, good stability, nicely balanced in flexion and extension.  I then cemented on the patellar component. Once the cement hardened, the knee was reexamined.  The extensor mechanism stability, patellofemoral joint, patellofemoral tracking, all excellent.  Wound was copiously irrigated.  Arthrotomy closed with #1 Vicryl.  Skin and subcutaneous tissue with Vicryl and staples.  Sterile compressive dressing applied.  Tourniquet deflated and removed.  Knee immobilizer applied.  Anesthesia reversed.  Brought to recovery room. Tolerated surgery well.  No complications.     Loreta Ave, M.D.     DFM/MEDQ  D:  10/02/2011  T:  10/02/2011  Job:  [id number]

## 2011-10-02 NOTE — Progress Notes (Signed)
UR COMPLETED  

## 2011-10-02 NOTE — Progress Notes (Signed)
Chart reviewed, spoke with PT, and spoke with pt. And wife.  No OT needs identified.  Will sign off.  Jeani Hawking, OTR/L [phone]

## 2011-10-02 NOTE — Progress Notes (Signed)
Physical Therapy Evaluation Patient Details Name: Raymond Herrera MRN: [id number] DOB: 1955-08-23 Today's Date: 10/02/2011 Time: 0933-1000 PT Time Calculation (min): 27 min  PT Assessment / Plan / Recommendation Clinical Impression  Pt is a 56 y/o male admitted s/p fall requiring left TKA revision.  Pt modified independent for all mobility except stairs (requiring supervision only for cues to sequence).  Pt even independent with exercises and ready for safe d/c home once medically cleared by MD.  No further acute care needs with all further needs to be addressed by HHPT.    PT Assessment  All further PT needs can be met in the next venue of care    Follow Up Recommendations  Home health PT    Barriers to Discharge        lEquipment Recommendations  None recommended by PT    Recommendations for Other Services     Frequency      Precautions / Restrictions Precautions Precautions: Knee Precaution Booklet Issued: No Required Braces or Orthoses: Knee Immobilizer - Left Knee Immobilizer - Left: On except when in CPM Restrictions Weight Bearing Restrictions: Yes LLE Weight Bearing: Weight bearing as tolerated    Pertinent Vitals/Pain 6/10 in left knee.  Pt repositioned and RN made aware.      Mobility  Bed Mobility Bed Mobility: Sit to Supine Sit to Supine: 6: Modified independent (Device/Increase time) Transfers Transfers: Sit to Stand;Stand to Sit Sit to Stand: 6: Modified independent (Device/Increase time) Stand to Sit: 6: Modified independent (Device/Increase time) Ambulation/Gait Ambulation/Gait Assistance: 6: Modified independent (Device/Increase time) Ambulation Distance (Feet): 320 Feet Assistive device: Rolling walker Ambulation/Gait Assistance Details: Pt ambulated with RW, but did not rely on it. Gait Pattern: Step-to pattern Stairs: Yes Stairs Assistance: 5: Supervision Stairs Assistance Details (indicate cue type and reason): Verbal cues for sequence  using "up with good, down with bad." Stair Management Technique: No rails;Step to pattern;Backwards;With walker Number of Stairs: 1  Wheelchair Mobility Wheelchair Mobility: No    Exercises Total Joint Exercises Ankle Circles/Pumps: AROM;Left;10 reps;Supine Quad Sets: AROM;Left;10 reps;Supine Heel Slides: AROM;Left;10 reps;Supine Hip ABduction/ADduction: AROM;Left;10 reps;Supine Straight Leg Raises: AROM;Left;10 reps;Supine Goniometric ROM: 72 degrees.   PT Diagnosis: Acute pain  PT Problem List: Decreased strength;Decreased range of motion;Pain PT Treatment Interventions:     PT Goals    Visit Information  Last PT Received On: 10/02/11 Assistance Needed: +1    Subjective Data  Subjective: "I've had both my knees replaced, so I have been through this before." Patient Stated Goal: Go home.   Prior Functioning  Home Living Lives With: Spouse Available Help at Discharge: Family;Available PRN/intermittently Type of Home: House Home Access: Stairs to enter [employer] of Steps: 1 Entrance Stairs-Rails: None Home Layout: One level Bathroom Shower/Tub: Walk-in Contractor: Handicapped height Home Adaptive Equipment: Crutches;Walker - rolling Prior Function Level of Independence: Independent Able to Take Stairs?: Yes Driving: Yes Vocation: Full time employment Communication Communication: No difficulties    Cognition  Overall Cognitive Status: Appears within functional limits for tasks assessed/performed Arousal/Alertness: Awake/alert Orientation Level: Oriented X4 / Intact Behavior During Session: WFL for tasks performed    Extremity/Trunk Assessment Right Upper Extremity Assessment RUE ROM/Strength/Tone: Within functional levels RUE Sensation: WFL - Light Touch RUE Coordination: WFL - gross/fine motor Left Upper Extremity Assessment LUE ROM/Strength/Tone: Within functional levels LUE Sensation: WFL - Light Touch LUE Coordination:  WFL - gross/fine motor Right Lower Extremity Assessment RLE ROM/Strength/Tone: Within functional levels RLE Sensation: WFL - Light Touch RLE  Coordination: WFL - gross/fine motor Left Lower Extremity Assessment LLE ROM/Strength/Tone: Deficits LLE ROM/Strength/Tone Deficits: 3/5 throughout.  L knee A/ROM 0-72 degrees. LLE Sensation: WFL - Light Touch LLE Coordination: WFL - gross/fine motor Trunk Assessment Trunk Assessment: Normal   Balance Balance Balance Assessed: No  End of Session PT - End of Session Equipment Utilized During Treatment: Gait belt;Left knee immobilizer Activity Tolerance: Patient tolerated treatment well Patient left: in chair;with call bell/phone within reach Nurse Communication: Mobility status;Weight bearing status   Cephus Shelling 10/02/2011, 10:50 AM  10/02/2011 Cephus Shelling, PT, DPT [phone]

## 2011-10-03 ENCOUNTER — Encounter (HOSPITAL_COMMUNITY): Payer: Self-pay | Admitting: Orthopedic Surgery

## 2011-10-06 NOTE — Discharge Summary (Signed)
  ABBREVIATED DISCHARGE SUMMARY      DATE OF HOSPITALIZATION:  01 Oct 2011  REASON FOR HOSPITALIZATION:  56 y.o. Wm with left total knee poly loosening and damage after fall.      SIGNIFICANT FINDINGS:  Patella poly loosening and spacer damage  OPERATION:  Left total knee revision  FINAL DIAGNOSIS:  same  SECONDARY DIAGNOSIS: none  CONSULTANTS:  none  DISCHARGE CONDITION:  STABLE  DISCHARGED TO:  HOME

## 2011-10-14 ENCOUNTER — Telehealth: Payer: Self-pay | Admitting: Family Medicine

## 2011-10-14 NOTE — Telephone Encounter (Addendum)
Pt called and said that he rcvd a letter from [facility], stating that it is time for pt to sch a colonoscopy. Does pt need a referral in order to get colonoscopy schd or can he call and make own appt at [facility]?   Also pt had an accident and is going to be out of work for 4 wks and is needing to get a work in cpx within that time time period. Pls advise if ok to work in? Pt said that he has new insurance and he called the insurance company to see if they would cover a sooner cpx, since last cpx was in July 2012 and insurance will cover cpx. The colonoscopy has to be coded as preventative, in order to get 100% coverage.

## 2011-10-15 NOTE — Telephone Encounter (Signed)
Okay to schedule a cpx in the next 4 weeks. We will do the referral for a colonoscopy at that time

## 2011-10-15 NOTE — Telephone Encounter (Signed)
Called and lft vm pt pt to sch cpx in next 4 wks as noted. Waiting for call back.

## 2011-10-15 NOTE — Telephone Encounter (Signed)
Pt called back and has been schd for fasting labs on 10/17/11 at 9:30 and cpx on 10/22/11 at 3:15. Pt says that he just had a lot of labs drawn while he was in hospital and didn't know if those labs could be used for cpx labs? Pls advise.

## 2011-10-16 NOTE — Telephone Encounter (Signed)
Pt informed

## 2011-10-16 NOTE — Telephone Encounter (Signed)
We need to redraw all the labs because I need to see them in a healthy resting state and when he is fasting

## 2011-10-17 ENCOUNTER — Other Ambulatory Visit (INDEPENDENT_AMBULATORY_CARE_PROVIDER_SITE_OTHER): Payer: BC Managed Care – PPO

## 2011-10-17 DIAGNOSIS — Z0000 Encounter for general adult medical examination without abnormal findings: Secondary | ICD-10-CM

## 2011-10-17 LAB — BASIC METABOLIC PANEL
BUN: 14 mg/dL (ref 6–23)
Chloride: 108 mEq/L (ref 96–112)
Creatinine, Ser: 0.8 mg/dL (ref 0.4–1.5)
GFR: 109.82 mL/min (ref >60.00)
Glucose, Bld: 108 mg/dL — ABNORMAL HIGH (ref 70–99)
Potassium: 3.9 mEq/L (ref 3.5–5.1)

## 2011-10-17 LAB — CBC WITH DIFFERENTIAL/PLATELET
Basophils Absolute: 0 10*3/uL (ref 0.0–0.1)
HCT: 45.3 % (ref 39.0–52.0)
Lymphs Abs: 1.5 10*3/uL (ref 0.7–4.0)
MCV: 89.9 fl (ref 78.0–100.0)
Monocytes Absolute: 0.4 10*3/uL (ref 0.1–1.0)
Neutrophils Relative %: 63.8 % (ref 43.0–77.0)
Platelets: 228 10*3/uL (ref 150.0–400.0)
RDW: 14.1 % (ref 11.5–14.6)

## 2011-10-17 LAB — LIPID PANEL
Cholesterol: 177 mg/dL (ref 0–200)
HDL: 30.5 mg/dL — ABNORMAL LOW (ref >39.00)
Triglycerides: 196 mg/dL — ABNORMAL HIGH (ref 0.0–149.0)
VLDL: 39.2 mg/dL (ref 0.0–40.0)

## 2011-10-17 LAB — POCT URINALYSIS DIPSTICK
Blood, UA: NEGATIVE
Ketones, UA: NEGATIVE
Protein, UA: NEGATIVE
Spec Grav, UA: 1.015
pH, UA: 6.5

## 2011-10-17 LAB — HEPATIC FUNCTION PANEL: Total Bilirubin: 0.7 mg/dL (ref 0.3–1.2)

## 2011-10-17 LAB — TSH: TSH: 3.31 u[IU]/mL (ref 0.35–5.50)

## 2011-10-17 LAB — PSA: PSA: 2.74 ng/mL (ref 0.10–4.00)

## 2011-10-22 ENCOUNTER — Ambulatory Visit (INDEPENDENT_AMBULATORY_CARE_PROVIDER_SITE_OTHER): Payer: BC Managed Care – PPO | Admitting: Family Medicine

## 2011-10-22 ENCOUNTER — Encounter: Payer: Self-pay | Admitting: Family Medicine

## 2011-10-22 VITALS — BP 128/70 | HR 97 | Temp 98.5°F | Ht 73.0 in | Wt 377.0 lb

## 2011-10-22 DIAGNOSIS — Z0000 Encounter for general adult medical examination without abnormal findings: Secondary | ICD-10-CM

## 2011-10-22 MED ORDER — OXYCODONE-ACETAMINOPHEN 7.5-325 MG PO TABS: 1.0000 | ORAL_TABLET | Freq: Four times a day (QID) | ORAL | Status: DC | PRN

## 2011-10-22 NOTE — Progress Notes (Signed)
Quick Note:  Pt is here today for his CPE and Dr. Clent Ridges will discuss results. ______

## 2011-10-22 NOTE — Progress Notes (Signed)
  Subjective:    Patient ID: Raymond Herrera, male    DOB: 09/13/1955, 55 y.o.   MRN: [id number]  HPI 56 yr old male for a cpx. He feels well except for stiffness and pain in the left knee. He had a total revision done on 10-01-11 after he fell and broke his original hardware. He has progressed fairly well since then and he is participating in rehab per Dr. Eulah Pont. Otherwise he is trying to eat smarter so he can lose some weight.    Review of Systems  Constitutional: Negative.   HENT: Negative.   Eyes: Negative.   Respiratory: Negative.   Cardiovascular: Negative.   Gastrointestinal: Negative.   Genitourinary: Negative.   Musculoskeletal: Positive for arthralgias. Negative for myalgias, back pain, joint swelling and gait problem.  Skin: Negative.   Neurological: Negative.   Hematological: Negative.   Psychiatric/Behavioral: Negative.        Objective:   Physical Exam  Constitutional: He is oriented to person, place, and time. No distress.       Morbidly obese  HENT:  Head: Normocephalic and atraumatic.  Right Ear: External ear normal.  Left Ear: External ear normal.  Nose: Nose normal.  Mouth/Throat: Oropharynx is clear and moist. No oropharyngeal exudate.  Eyes: Conjunctivae and EOM are normal. Pupils are equal, round, and reactive to light. Right eye exhibits no discharge. Left eye exhibits no discharge. No scleral icterus.  Neck: Neck supple. No JVD present. No tracheal deviation present. No thyromegaly present.  Cardiovascular: Normal rate, regular rhythm, normal heart sounds and intact distal pulses.  Exam reveals no gallop and no friction rub.   No murmur heard. Pulmonary/Chest: Effort normal and breath sounds normal. No respiratory distress. He has no wheezes. He has no rales. He exhibits no tenderness.  Abdominal: Soft. Bowel sounds are normal. He exhibits no distension and no mass. There is no tenderness. There is no rebound and no guarding.  Genitourinary: Rectum  normal, prostate normal and penis normal. Guaiac negative stool. No penile tenderness.  Musculoskeletal: Normal range of motion. He exhibits no edema and no tenderness.  Lymphadenopathy:    He has no cervical adenopathy.  Neurological: He is alert and oriented to person, place, and time. He has normal reflexes. No cranial nerve deficit. He exhibits normal muscle tone. Coordination normal.  Skin: Skin is warm and dry. No rash noted. He is not diaphoretic. No erythema. No pallor.  Psychiatric: He has a normal mood and affect. His behavior is normal. Judgment and thought content normal.          Assessment & Plan:  Well exam. He will follow up with Dr. Eulah Pont. Encouraged him to lose weight

## 2011-10-27 ENCOUNTER — Encounter: Payer: Self-pay | Admitting: Gastroenterology

## 2011-10-31 ENCOUNTER — Telehealth: Payer: Self-pay | Admitting: *Deleted

## 2011-10-31 NOTE — Telephone Encounter (Signed)
Pt informed he was not due for colon until 2018.  Recall put in epic and pt sent copies of 2008 colon procedure and path letter and path.

## 2011-11-12 ENCOUNTER — Other Ambulatory Visit: Payer: BC Managed Care – PPO | Admitting: Gastroenterology

## 2011-12-17 DIAGNOSIS — Z0279 Encounter for issue of other medical certificate: Secondary | ICD-10-CM

## 2011-12-23 ENCOUNTER — Other Ambulatory Visit: Payer: Self-pay | Admitting: Family Medicine

## 2012-01-20 ENCOUNTER — Other Ambulatory Visit: Payer: Self-pay | Admitting: Family Medicine

## 2012-01-20 NOTE — Telephone Encounter (Signed)
Pt needs new rx percocet °

## 2012-01-21 MED ORDER — OXYCODONE-ACETAMINOPHEN 7.5-325 MG PO TABS: 1.0000 | ORAL_TABLET | Freq: Four times a day (QID) | ORAL | Status: DC | PRN

## 2012-01-21 NOTE — Telephone Encounter (Signed)
Script is ready for pick up and I spoke with pt.  

## 2012-01-21 NOTE — Telephone Encounter (Signed)
done

## 2012-02-02 ENCOUNTER — Other Ambulatory Visit: Payer: Self-pay | Admitting: Family Medicine

## 2012-02-02 NOTE — Telephone Encounter (Signed)
Call in #60 with 5 rf 

## 2012-02-09 ENCOUNTER — Ambulatory Visit: Payer: BC Managed Care – PPO | Admitting: Pulmonary Disease

## 2012-03-05 ENCOUNTER — Ambulatory Visit (INDEPENDENT_AMBULATORY_CARE_PROVIDER_SITE_OTHER): Payer: BC Managed Care – PPO | Admitting: Pulmonary Disease

## 2012-03-05 ENCOUNTER — Encounter: Payer: Self-pay | Admitting: Pulmonary Disease

## 2012-03-05 VITALS — BP 130/90 | HR 87 | Temp 98.4°F | Ht 75.0 in | Wt 381.6 lb

## 2012-03-05 DIAGNOSIS — G4733 Obstructive sleep apnea (adult) (pediatric): Secondary | ICD-10-CM

## 2012-03-05 NOTE — Assessment & Plan Note (Signed)
The patient is doing fairly well with CPAP, and feels that it is helping his sleep and daytime alertness.  His only complaint today is that of intermittent nasal congestion that occurs during the middle of night, and I wonder if he is not getting enough humidity.  His turbinates are not overly enlarged on exam today.  I have asked him to increase the heat some on his humidifier, and if this does not help, he is to try an antihistamine at bedtime.  I have also encouraged him to work aggressively on weight loss, and to keep up with his mask changes and supplies.

## 2012-03-05 NOTE — Progress Notes (Signed)
  Subjective:    Patient ID: Raymond Herrera, male    DOB: 1955-07-03, 56 y.o.   MRN: [id number]  HPI Patient comes in today for followup of his known obstructive sleep apnea.  He has been wearing CPAP compliantly, and feels that he is doing well from a sleep and alert this standpoint.  He is having no mask or pressure complaints.  His only complaint is that of intermittent nasal congestion that occurs during the middle of the night and awakens him from sleep.  This will sometimes clear with blowing his nose, but on occasion he is to use a spray of Afrin.  He denies any allergy symptoms during the day or nasal congestion symptoms during the day.  He is adjusting his heater on his humidifier to increase moisture.   Review of Systems  Constitutional: Negative for fever and unexpected weight change.  HENT: Positive for congestion ( pt thinks d/t CPAP) and sneezing. Negative for ear pain, nosebleeds, sore throat, rhinorrhea, trouble swallowing, dental problem, postnasal drip and sinus pressure.   Eyes: Negative for redness and itching.  Respiratory: Negative for cough, chest tightness, shortness of breath and wheezing.   Cardiovascular: Negative for palpitations and leg swelling.  Gastrointestinal: Negative for nausea and vomiting.  Genitourinary: Negative for dysuria.  Musculoskeletal: Positive for arthralgias. Negative for joint swelling.  Skin: Negative for rash.  Neurological: Negative for headaches.  Hematological: Does not bruise/bleed easily.  Psychiatric/Behavioral: Positive for dysphoric mood ( stress/depression with work). The patient is not nervous/anxious.        Objective:   Physical Exam Obese male in no acute distress Nose without purulence or discharge noted.  Turbinates are not overly swollen No skin breakdown or pressure necrosis from the CPAP mask Lower extremities without significant edema, no cyanosis Alert and oriented, does not appear to be sleepy, moves all 4  extremities.       Assessment & Plan:

## 2012-03-05 NOTE — Patient Instructions (Addendum)
Continue with cpap, and keep up with mask changes and supplies. Try turning your humidity up to see if it helps your nighttime nasal congestion.  If it doesn't, try zyrtec 10mg  at bedtime each night to see if it helps. Work on weight loss followup with me in one year if doing well.

## 2012-03-16 ENCOUNTER — Telehealth: Payer: Self-pay | Admitting: Family Medicine

## 2012-03-16 NOTE — Telephone Encounter (Signed)
Refill request for Percocet 7.5-325 mg take 1 po q6-8hrs prn and last here on 10/22/11.

## 2012-03-17 MED ORDER — OXYCODONE-ACETAMINOPHEN 7.5-325 MG PO TABS: 1.0000 | ORAL_TABLET | Freq: Four times a day (QID) | ORAL | Status: DC | PRN

## 2012-03-17 NOTE — Telephone Encounter (Signed)
Script is ready for pick up and left message. 

## 2012-03-17 NOTE — Telephone Encounter (Signed)
done

## 2012-03-23 ENCOUNTER — Telehealth: Payer: Self-pay | Admitting: Family Medicine

## 2012-03-23 DIAGNOSIS — M1990 Unspecified osteoarthritis, unspecified site: Secondary | ICD-10-CM

## 2012-03-23 NOTE — Telephone Encounter (Signed)
Refill request for Hydocodone-APAP 10/325 mg take 1 po q6hrs prn and last here on 10/22/11.

## 2012-03-24 MED ORDER — HYDROCODONE-ACETAMINOPHEN 10-325 MG PO TABS: 1.0000 | ORAL_TABLET | Freq: Four times a day (QID) | ORAL | Status: AC | PRN

## 2012-03-24 MED ORDER — HYDROCODONE-ACETAMINOPHEN 10-325 MG PO TABS: 1.0000 | ORAL_TABLET | Freq: Four times a day (QID) | ORAL | Status: DC | PRN

## 2012-03-24 NOTE — Telephone Encounter (Signed)
Call in #[phone] with 5 rf 

## 2012-03-24 NOTE — Telephone Encounter (Signed)
Called in Rx

## 2012-06-07 ENCOUNTER — Other Ambulatory Visit: Payer: Self-pay | Admitting: Family Medicine

## 2012-06-07 MED ORDER — OXYCODONE-ACETAMINOPHEN 7.5-325 MG PO TABS: 1.0000 | ORAL_TABLET | Freq: Four times a day (QID) | ORAL | Status: DC | PRN

## 2012-06-07 NOTE — Telephone Encounter (Signed)
Done

## 2012-06-07 NOTE — Telephone Encounter (Signed)
Script is ready for pick up and left message. 

## 2012-06-07 NOTE — Telephone Encounter (Signed)
Pt needs refill of oxyCODONE-acetaminophen (PERCOCET) 7.5-325 MG per tablet. Best number work.

## 2012-08-11 ENCOUNTER — Telehealth: Payer: Self-pay | Admitting: Family Medicine

## 2012-08-11 NOTE — Telephone Encounter (Signed)
Refill request for Lorazepam 1 mg take 1 po bid

## 2012-08-11 NOTE — Telephone Encounter (Signed)
Call in #60 with 5 rf 

## 2012-08-12 MED ORDER — LORAZEPAM 1 MG PO TABS: 1.0000 mg | ORAL_TABLET | Freq: Two times a day (BID) | ORAL | Status: DC

## 2012-08-12 NOTE — Telephone Encounter (Signed)
I called in script 

## 2012-08-16 ENCOUNTER — Telehealth: Payer: Self-pay | Admitting: Family Medicine

## 2012-08-16 MED ORDER — OXYCODONE-ACETAMINOPHEN 7.5-325 MG PO TABS: 1.0000 | ORAL_TABLET | Freq: Four times a day (QID) | ORAL | Status: DC | PRN

## 2012-08-16 NOTE — Telephone Encounter (Signed)
Patient called stating that he need a refill of his percocet 7.5-325mg  1 poq 6 hrs prn before his insurance runs out. Please assist.

## 2012-08-16 NOTE — Telephone Encounter (Signed)
done

## 2012-08-16 NOTE — Telephone Encounter (Signed)
Script is ready for pick up and left message for pt.

## 2012-10-14 ENCOUNTER — Telehealth: Payer: Self-pay | Admitting: Family Medicine

## 2012-10-14 NOTE — Telephone Encounter (Signed)
Pt needs new rx percocet °

## 2012-10-15 MED ORDER — OXYCODONE-ACETAMINOPHEN 7.5-325 MG PO TABS: 1.0000 | ORAL_TABLET | Freq: Four times a day (QID) | ORAL | Status: DC | PRN

## 2012-10-15 NOTE — Telephone Encounter (Signed)
done

## 2012-10-15 NOTE — Telephone Encounter (Signed)
Script is ready for pick up and left voice message for pt. 

## 2012-11-16 ENCOUNTER — Telehealth: Payer: Self-pay | Admitting: Family Medicine

## 2012-11-16 MED ORDER — PANTOPRAZOLE SODIUM 40 MG PO TBEC: 40.0000 mg | DELAYED_RELEASE_TABLET | Freq: Every day | ORAL | Status: DC

## 2012-11-16 NOTE — Telephone Encounter (Signed)
Patient's prior auth for NEXIUM denied. Per new insurance, pt must try 2 alternatives. I spoke w/ pt yesterday - he stated he has tried Prilosec. He must try a 2nd - choices are:  Pantoprazole, generic or brand Aciphex, and Dexilant.   Please advise - pt uses [employer].

## 2012-11-16 NOTE — Telephone Encounter (Signed)
I sent new script e-scribe. 

## 2012-11-16 NOTE — Telephone Encounter (Signed)
Call in Pantoprazole 40 mg daily for one year

## 2012-12-04 ENCOUNTER — Other Ambulatory Visit: Payer: Self-pay | Admitting: Family Medicine

## 2012-12-13 ENCOUNTER — Telehealth: Payer: Self-pay | Admitting: Family Medicine

## 2012-12-13 NOTE — Telephone Encounter (Signed)
PT is calling to request a refill of his oxyCODONE-acetaminophen (PERCOCET) 7.5-325 MG per tablet. Please assist.

## 2012-12-14 ENCOUNTER — Other Ambulatory Visit: Payer: BC Managed Care – PPO

## 2012-12-14 MED ORDER — OXYCODONE-ACETAMINOPHEN 7.5-325 MG PO TABS: 1.0000 | ORAL_TABLET | Freq: Four times a day (QID) | ORAL | Status: DC | PRN

## 2012-12-14 NOTE — Telephone Encounter (Signed)
done

## 2012-12-14 NOTE — Telephone Encounter (Signed)
Script is ready for pick and left a voice message.

## 2012-12-15 ENCOUNTER — Other Ambulatory Visit (INDEPENDENT_AMBULATORY_CARE_PROVIDER_SITE_OTHER): Payer: 59

## 2012-12-15 DIAGNOSIS — Z0000 Encounter for general adult medical examination without abnormal findings: Secondary | ICD-10-CM

## 2012-12-15 LAB — HEPATIC FUNCTION PANEL
ALT: 28 U/L (ref 0–53)
AST: 17 U/L (ref 0–37)
Albumin: 3.6 g/dL (ref 3.5–5.2)
Alkaline Phosphatase: 44 U/L (ref 39–117)
Bilirubin, Direct: 0.1 mg/dL (ref 0.0–0.3)
Total Bilirubin: 0.8 mg/dL (ref 0.3–1.2)
Total Protein: 6.2 g/dL (ref 6.0–8.3)

## 2012-12-15 LAB — BASIC METABOLIC PANEL
BUN: 12 mg/dL (ref 6–23)
CO2: 29 mEq/L (ref 19–32)
Calcium: 9 mg/dL (ref 8.4–10.5)
Chloride: 106 mEq/L (ref 96–112)
Creatinine, Ser: 0.9 mg/dL (ref 0.4–1.5)
GFR: 95.14 mL/min (ref >60.00)
Glucose, Bld: 89 mg/dL (ref 70–99)
Potassium: 4.1 mEq/L (ref 3.5–5.1)
Sodium: 140 mEq/L (ref 135–145)

## 2012-12-15 LAB — CBC WITH DIFFERENTIAL/PLATELET
Basophils Absolute: 0 10*3/uL (ref 0.0–0.1)
Basophils Relative: 0.7 % (ref 0.0–3.0)
Eosinophils Absolute: 0.1 10*3/uL (ref 0.0–0.7)
Eosinophils Relative: 1.8 % (ref 0.0–5.0)
HCT: 47.8 % (ref 39.0–52.0)
Hemoglobin: 16.2 g/dL (ref 13.0–17.0)
Lymphocytes Relative: 28.7 % (ref 12.0–46.0)
Lymphs Abs: 1.9 10*3/uL (ref 0.7–4.0)
MCHC: 34 g/dL (ref 30.0–36.0)
MCV: 91 fl (ref 78.0–100.0)
Monocytes Absolute: 0.6 10*3/uL (ref 0.1–1.0)
Monocytes Relative: 9.5 % (ref 3.0–12.0)
Neutro Abs: 3.8 10*3/uL (ref 1.4–7.7)
Neutrophils Relative %: 59.3 % (ref 43.0–77.0)
Platelets: 191 10*3/uL (ref 150.0–400.0)
RBC: 5.25 Mil/uL (ref 4.22–5.81)
RDW: 13.8 % (ref 11.5–14.6)
WBC: 6.5 10*3/uL (ref 4.5–10.5)

## 2012-12-15 LAB — LIPID PANEL
Cholesterol: 182 mg/dL (ref 0–200)
HDL: 36.6 mg/dL — ABNORMAL LOW (ref >39.00)
Triglycerides: 107 mg/dL (ref 0.0–149.0)

## 2012-12-15 LAB — TSH: TSH: 3.4 u[IU]/mL (ref 0.35–5.50)

## 2012-12-15 LAB — POCT URINALYSIS DIPSTICK
Bilirubin, UA: NEGATIVE
Blood, UA: NEGATIVE
Ketones, UA: NEGATIVE
Leukocytes, UA: NEGATIVE
pH, UA: 6.5

## 2012-12-17 NOTE — Progress Notes (Signed)
Quick Note:  Pt has appointment on 12/21/12 will go over then. ______

## 2012-12-21 ENCOUNTER — Encounter: Payer: Self-pay | Admitting: Family Medicine

## 2012-12-21 ENCOUNTER — Ambulatory Visit (INDEPENDENT_AMBULATORY_CARE_PROVIDER_SITE_OTHER): Payer: 59 | Admitting: Family Medicine

## 2012-12-21 VITALS — BP 130/70 | HR 109 | Temp 98.4°F | Ht 73.5 in | Wt 380.0 lb

## 2012-12-21 DIAGNOSIS — Z0000 Encounter for general adult medical examination without abnormal findings: Secondary | ICD-10-CM

## 2012-12-21 MED ORDER — ESOMEPRAZOLE MAGNESIUM 20 MG PO CPDR: 40.0000 mg | DELAYED_RELEASE_CAPSULE | Freq: Every day | ORAL | Status: DC

## 2012-12-21 MED ORDER — CELECOXIB 200 MG PO CAPS: 200.0000 mg | ORAL_CAPSULE | Freq: Two times a day (BID) | ORAL | Status: AC

## 2012-12-21 MED ORDER — METHOCARBAMOL 500 MG PO TABS: 500.0000 mg | ORAL_TABLET | Freq: Four times a day (QID) | ORAL | Status: AC

## 2012-12-21 MED ORDER — LORAZEPAM 2 MG PO TABS: 2.0000 mg | ORAL_TABLET | Freq: Two times a day (BID) | ORAL | Status: DC | PRN

## 2012-12-21 MED ORDER — EPINEPHRINE 0.3 MG/0.3ML IJ SOAJ: 0.3000 mg | Freq: Once | INTRAMUSCULAR | Status: AC

## 2012-12-21 NOTE — Progress Notes (Signed)
  Subjective:    Patient ID: Raymond Herrera, male    DOB: 10-Aug-1955, 57 y.o.   MRN: [id number]  HPI 57 yr old male for a cpx. He feels well in general except for his chronic back and knee pain. His anxiety has gotten a little worse however.    Review of Systems  Constitutional: Negative.   HENT: Negative.   Eyes: Negative.   Respiratory: Negative.   Cardiovascular: Negative.   Gastrointestinal: Negative.   Genitourinary: Negative.   Musculoskeletal: Negative.   Skin: Negative.   Neurological: Negative.   Psychiatric/Behavioral: Negative.        Objective:   Physical Exam  Constitutional: He is oriented to person, place, and time. He appears well-developed and well-nourished. No distress.  HENT:  Head: Normocephalic and atraumatic.  Right Ear: External ear normal.  Left Ear: External ear normal.  Nose: Nose normal.  Mouth/Throat: Oropharynx is clear and moist. No oropharyngeal exudate.  Eyes: Conjunctivae and EOM are normal. Pupils are equal, round, and reactive to light. Right eye exhibits no discharge. Left eye exhibits no discharge. No scleral icterus.  Neck: Neck supple. No JVD present. No tracheal deviation present. No thyromegaly present.  Cardiovascular: Normal rate, regular rhythm, normal heart sounds and intact distal pulses.  Exam reveals no gallop and no friction rub.   No murmur heard. EKG shows stable LAFB   Pulmonary/Chest: Effort normal and breath sounds normal. No respiratory distress. He has no wheezes. He has no rales. He exhibits no tenderness.  Abdominal: Soft. Bowel sounds are normal. He exhibits no distension and no mass. There is no tenderness. There is no rebound and no guarding.  Genitourinary: Rectum normal, prostate normal and penis normal. Guaiac negative stool. No penile tenderness.  Musculoskeletal: Normal range of motion. He exhibits no edema and no tenderness.  Lymphadenopathy:    He has no cervical adenopathy.  Neurological: He is alert  and oriented to person, place, and time. He has normal reflexes. No cranial nerve deficit. He exhibits normal muscle tone. Coordination normal.  Skin: Skin is warm and dry. No rash noted. He is not diaphoretic. No erythema. No pallor.  Psychiatric: He has a normal mood and affect. His behavior is normal. Judgment and thought content normal.          Assessment & Plan:  Well exam. Increase Lorazepam to 2 mg prn. He needs to lose weight.

## 2013-01-07 ENCOUNTER — Other Ambulatory Visit: Payer: Self-pay | Admitting: Family Medicine

## 2013-01-07 NOTE — Telephone Encounter (Signed)
Refill request for Norco 10-325 mg take 1 po q6hrs prn.

## 2013-01-09 NOTE — Telephone Encounter (Signed)
Call in #[phone] with 5 rf 

## 2013-01-10 MED ORDER — HYDROCODONE-ACETAMINOPHEN 10-325 MG PO TABS: 1.0000 | ORAL_TABLET | Freq: Four times a day (QID) | ORAL | Status: DC | PRN

## 2013-01-28 ENCOUNTER — Telehealth: Payer: Self-pay | Admitting: Family Medicine

## 2013-01-28 MED ORDER — OXYCODONE-ACETAMINOPHEN 7.5-325 MG PO TABS: 1.0000 | ORAL_TABLET | Freq: Four times a day (QID) | ORAL | Status: DC | PRN

## 2013-01-28 NOTE — Telephone Encounter (Signed)
done

## 2013-01-28 NOTE — Telephone Encounter (Signed)
Pt requesting rx refill of oxyCODONE-acetaminophen (PERCOCET) 7.5-325 MG per tablet.  Please call when available for pick up.

## 2013-01-28 NOTE — Telephone Encounter (Signed)
Pt notified rx available for pick up

## 2013-01-31 ENCOUNTER — Telehealth: Payer: Self-pay | Admitting: Family Medicine

## 2013-01-31 NOTE — Telephone Encounter (Signed)
Pt states his insurance com will no longer fill his RX for 120 tab, only 100 tablets. Pt wanted you to know so if he calls in earlier that he did not have the 120 tables of HYDROcodone-acetaminophen (NORCO) 10-325 MG per tablet

## 2013-01-31 NOTE — Telephone Encounter (Signed)
noted 

## 2013-03-03 ENCOUNTER — Telehealth: Payer: Self-pay | Admitting: Family Medicine

## 2013-03-03 MED ORDER — HYDROCODONE-ACETAMINOPHEN 10-325 MG PO TABS: 1.0000 | ORAL_TABLET | Freq: Four times a day (QID) | ORAL | Status: DC | PRN

## 2013-03-03 NOTE — Telephone Encounter (Signed)
Scripts are ready for pick up and I left a voice message. 

## 2013-03-03 NOTE — Telephone Encounter (Signed)
Pt is calling to request a refill of his HYDROcodone-acetaminophen (NORCO) 10-325 MG per tablet. Please assist.

## 2013-03-03 NOTE — Telephone Encounter (Signed)
done

## 2013-03-04 ENCOUNTER — Telehealth: Payer: Self-pay | Admitting: Family Medicine

## 2013-03-04 MED ORDER — OXYCODONE-ACETAMINOPHEN 7.5-325 MG PO TABS: 1.0000 | ORAL_TABLET | Freq: Four times a day (QID) | ORAL | Status: DC | PRN

## 2013-03-04 NOTE — Telephone Encounter (Signed)
Pt is having issues getting the HYDROcodone-acetaminophen (NORCO) 10-325 MG per tablet Filled at the 120 tab. Insurance is only wanting to fill 100 tab.  [place] asked pt to call his md and have [place] call and say we are calling about a  P.[employer].A. Pt states to say its urgent so they will expedite, [phone] Pt now has [place]. Member no [id number]       Group name [employer] no [id number] Effective  8/01 Pharm: [facility] Pt has picked up this month rx .

## 2013-03-04 NOTE — Telephone Encounter (Signed)
Given to the patient  

## 2013-03-04 NOTE — Telephone Encounter (Signed)
Pt is calling to request a refill of his oxyCODONE-acetaminophen (PERCOCET) 7.5-325 MG per tablet. He states that he picked up a RX for hydrocodone this morning but that it should have been for hydrocodone. Please assist.

## 2013-03-04 NOTE — Telephone Encounter (Signed)
FYI

## 2013-03-04 NOTE — Telephone Encounter (Signed)
Prior auth submitted today.

## 2013-03-04 NOTE — Telephone Encounter (Signed)
I received a decision back from his insurance, [place]. Quantity limit override (from #100 to #120 per 30 days) is denied. Per [place], the patient's policy excluded coverage of quantity limit exceptions. He will only be able to get insurance to pay for #100 per 30 days. He would have to pay cash for the other #20.

## 2013-03-11 ENCOUNTER — Ambulatory Visit: Payer: BC Managed Care – PPO | Admitting: Pulmonary Disease

## 2013-03-14 ENCOUNTER — Telehealth: Payer: Self-pay | Admitting: Family Medicine

## 2013-03-14 NOTE — Telephone Encounter (Signed)
I left a voice message for pt to call me back. I need to know what exactly pt needs?

## 2013-03-14 NOTE — Telephone Encounter (Signed)
I recv a denial for HYDROcodone-acetaminophen (NORCO) 10-325 MG per tablet qty 120 from [place].  Pt can only fill 100 tablets per fill or re-fill.  The qty can reflect 130 tablets but the pt can only pick-up 100 and then go back for the additional 30 tablets OR the script needs to reflect a qty of 100 or less.  Please advise.    Thanks a bunch  [employer]

## 2013-03-15 NOTE — Telephone Encounter (Signed)
I spoke to pt and he states qty 130 is okay to fill.  Thanks  [employer]

## 2013-03-15 NOTE — Telephone Encounter (Signed)
We can write a new rx for #100 but he may bring the original rx back in to be destroyed

## 2013-03-15 NOTE — Telephone Encounter (Signed)
Pt needs new script for Norco, please see below note.

## 2013-03-16 NOTE — Telephone Encounter (Signed)
I left message with the below information.

## 2013-04-07 ENCOUNTER — Telehealth: Payer: Self-pay | Admitting: Family Medicine

## 2013-04-07 NOTE — Telephone Encounter (Signed)
Pt request refill of oxyCODONE-acetaminophen (PERCOCET) 7.5-325 MG per tablet Pt's insurance will only pay for #100 tab/mo

## 2013-04-08 MED ORDER — OXYCODONE-ACETAMINOPHEN 7.5-325 MG PO TABS: 1.0000 | ORAL_TABLET | Freq: Four times a day (QID) | ORAL | Status: DC | PRN

## 2013-04-08 NOTE — Telephone Encounter (Signed)
done

## 2013-04-08 NOTE — Telephone Encounter (Signed)
Script is ready for pick up and I left a voice message for pt. 

## 2013-05-10 ENCOUNTER — Telehealth: Payer: Self-pay | Admitting: Family Medicine

## 2013-05-10 MED ORDER — OXYCODONE-ACETAMINOPHEN 7.5-325 MG PO TABS: 1.0000 | ORAL_TABLET | Freq: Four times a day (QID) | ORAL | Status: DC | PRN

## 2013-05-10 NOTE — Telephone Encounter (Signed)
Done but he needs testing and a contract  

## 2013-05-10 NOTE — Telephone Encounter (Signed)
Pt needs new rx oxycodone °

## 2013-05-11 NOTE — Telephone Encounter (Signed)
Called and spoke with pt and pt is aware.  

## 2013-05-30 ENCOUNTER — Encounter: Payer: Self-pay | Admitting: Family Medicine

## 2013-06-07 ENCOUNTER — Other Ambulatory Visit: Payer: Self-pay | Admitting: Family Medicine

## 2013-06-07 ENCOUNTER — Telehealth: Payer: Self-pay | Admitting: Family Medicine

## 2013-06-07 MED ORDER — OXYCODONE-ACETAMINOPHEN 7.5-325 MG PO TABS: 1.0000 | ORAL_TABLET | Freq: Four times a day (QID) | ORAL | Status: DC | PRN

## 2013-06-07 NOTE — Telephone Encounter (Signed)
Pt request refill oxyCODONE-acetaminophen (PERCOCET) 7.5-325 MG per tablet

## 2013-06-07 NOTE — Telephone Encounter (Signed)
Script is ready for pick up and pt is here now for this.

## 2013-06-07 NOTE — Telephone Encounter (Signed)
Done

## 2013-06-26 ENCOUNTER — Other Ambulatory Visit: Payer: Self-pay | Admitting: Family Medicine

## 2013-06-27 NOTE — Telephone Encounter (Signed)
Call in #180 with one rf  

## 2013-06-28 ENCOUNTER — Telehealth: Payer: Self-pay | Admitting: Family Medicine

## 2013-06-28 NOTE — Telephone Encounter (Signed)
I spoke with pt about insurance and the coverage for urine testing.

## 2013-07-05 ENCOUNTER — Telehealth: Payer: Self-pay | Admitting: Family Medicine

## 2013-07-05 NOTE — Telephone Encounter (Signed)
Pt needs new rx percocet #120. Pt new ins will allow him to get 120 per month

## 2013-07-07 MED ORDER — OXYCODONE-ACETAMINOPHEN 7.5-325 MG PO TABS: 1.0000 | ORAL_TABLET | Freq: Four times a day (QID) | ORAL | Status: DC | PRN

## 2013-07-07 NOTE — Telephone Encounter (Signed)
Pt following up on rx, pt has been out of town.

## 2013-07-07 NOTE — Telephone Encounter (Signed)
Script is ready for pick up and I spoke with pt.  

## 2013-07-07 NOTE — Telephone Encounter (Signed)
done

## 2013-08-05 ENCOUNTER — Telehealth: Payer: Self-pay | Admitting: Family Medicine

## 2013-08-05 NOTE — Telephone Encounter (Signed)
Pt requesting refill of oxyCODONE-acetaminophen (PERCOCET) 7.5-325 MG per tablet °  °

## 2013-08-08 MED ORDER — OXYCODONE-ACETAMINOPHEN 7.5-325 MG PO TABS: 1.0000 | ORAL_TABLET | Freq: Four times a day (QID) | ORAL | Status: DC | PRN

## 2013-08-08 NOTE — Telephone Encounter (Signed)
Script is ready for pick up and I left a voice message.  

## 2013-08-08 NOTE — Telephone Encounter (Signed)
done

## 2013-08-22 IMAGING — CR DG CHEST 2V
2 series · 2 of 2 positions shown · non-contrast
Comparison: Chest radiograph 03/26/2006 and 03/18/2005

CLINICAL DATA: Preop for total knee replacement

CHEST - 2 VIEW

[view not recorded (1 of 2)]
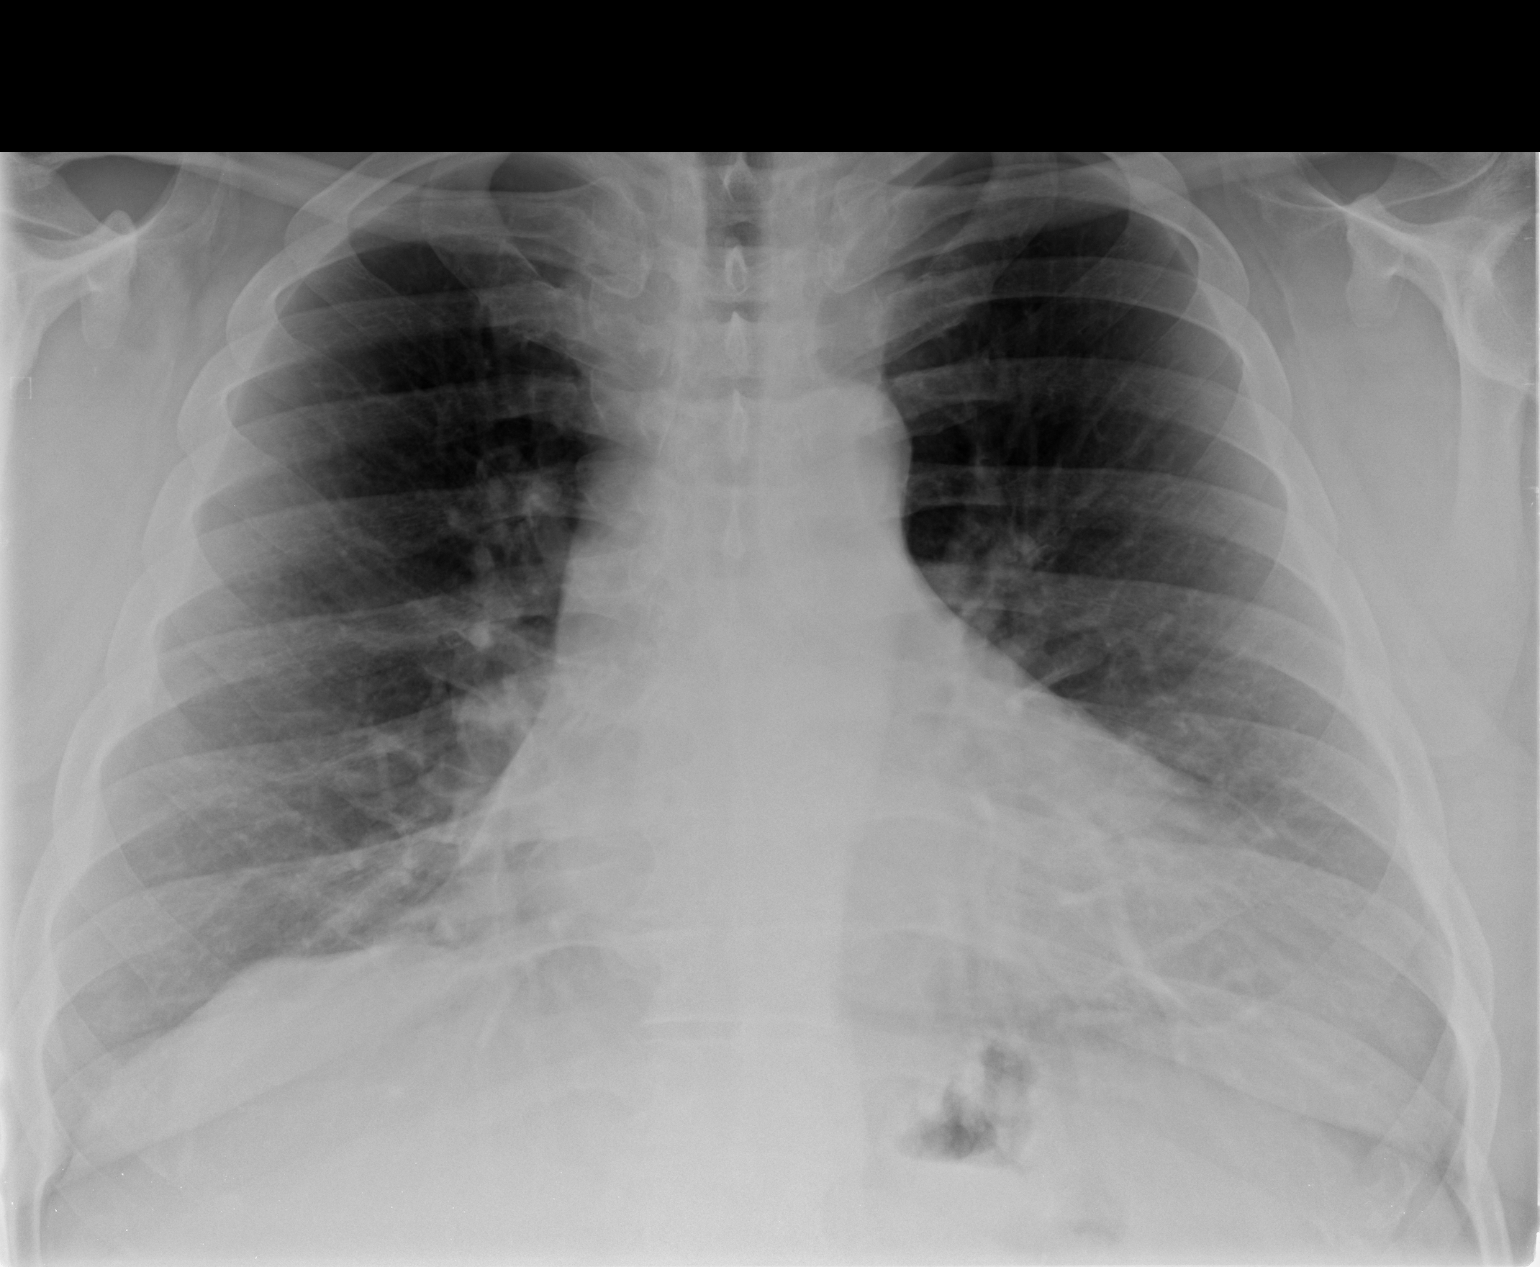

[view not recorded (2 of 2)]
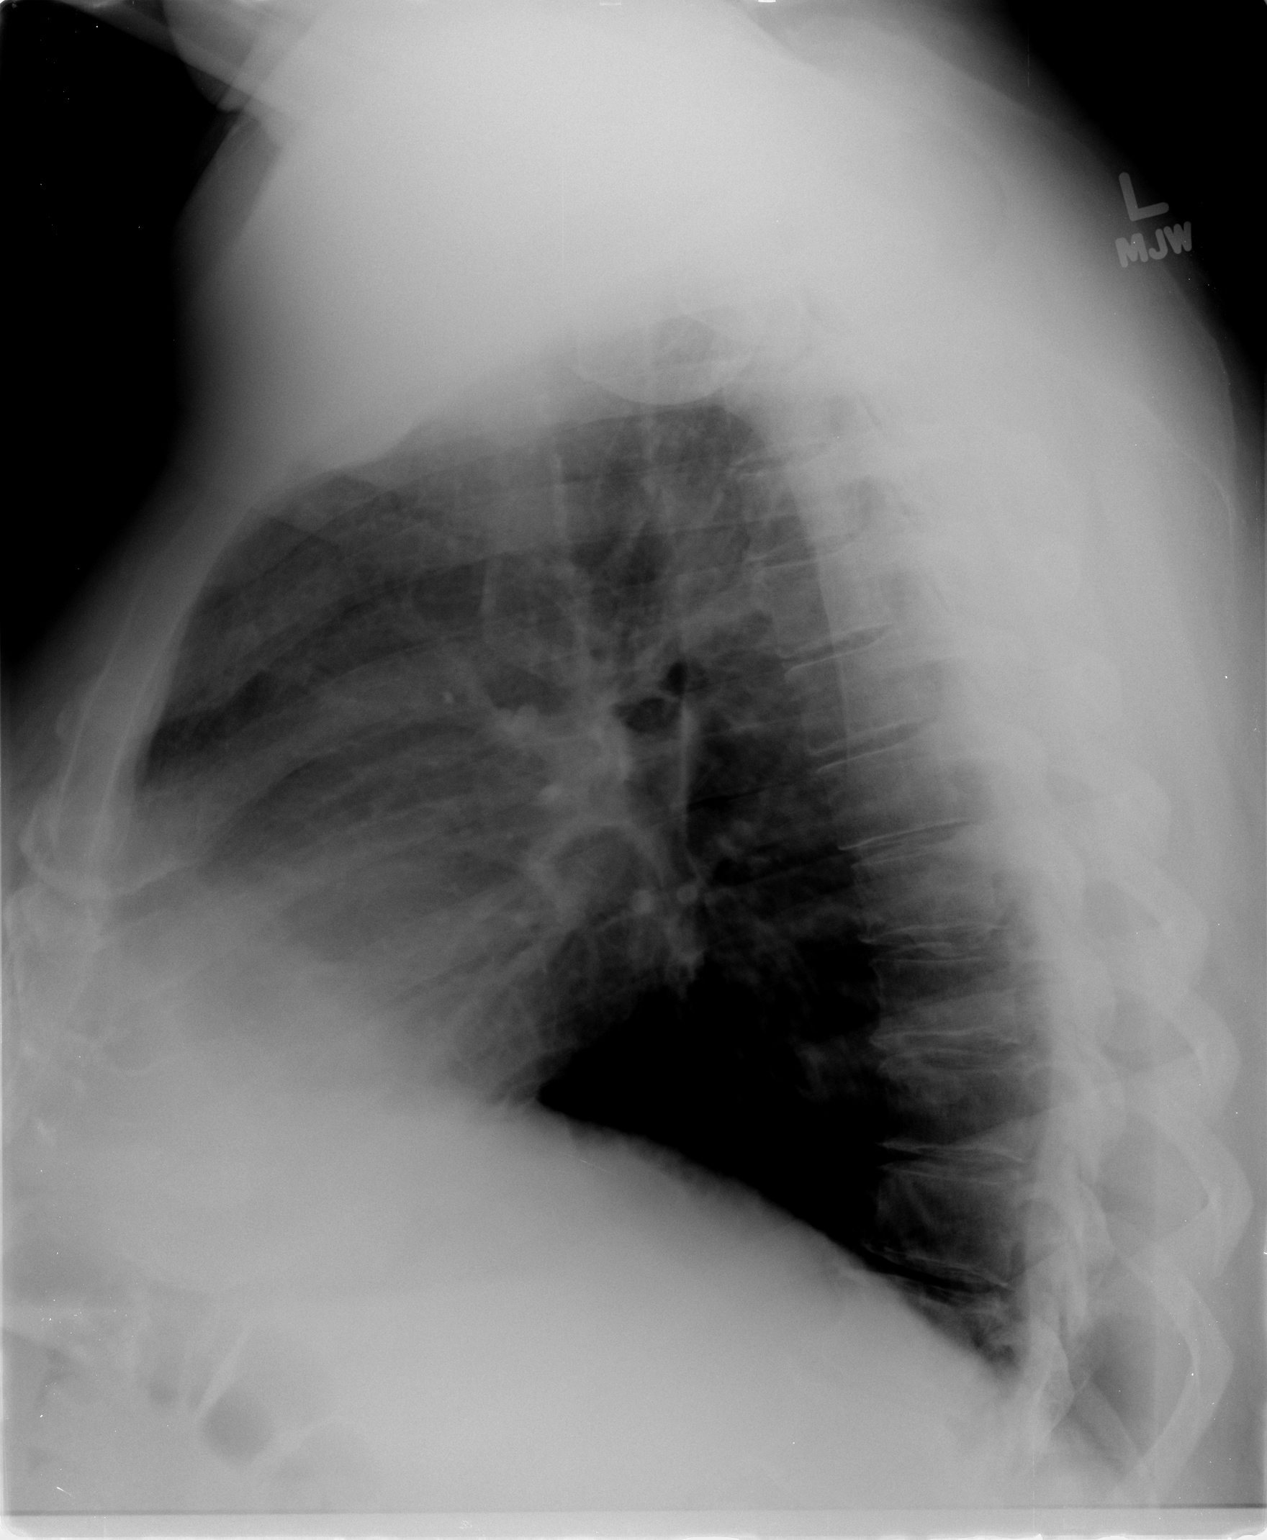

[2 of 2 positions shown; findings below may reference images not displayed]

FINDINGS: Borderline cardiomegaly.  Mediastinal and hilar contours
are normal.  The lungs are normally expanded and clear.  No pleural
effusion.  No acute bony abnormality.
IMPRESSION: Borderline cardiomegaly.  No acute findings.

## 2013-08-23 IMAGING — CR DG KNEE 1-2V PORT*L*
2 series · 2 of 2 positions shown · non-contrast
Comparison: 04/02/2006

CLINICAL DATA: Postop left knee arthroplasty.

PORTABLE LEFT KNEE - 1-2 VIEW

[ap/obl knee]
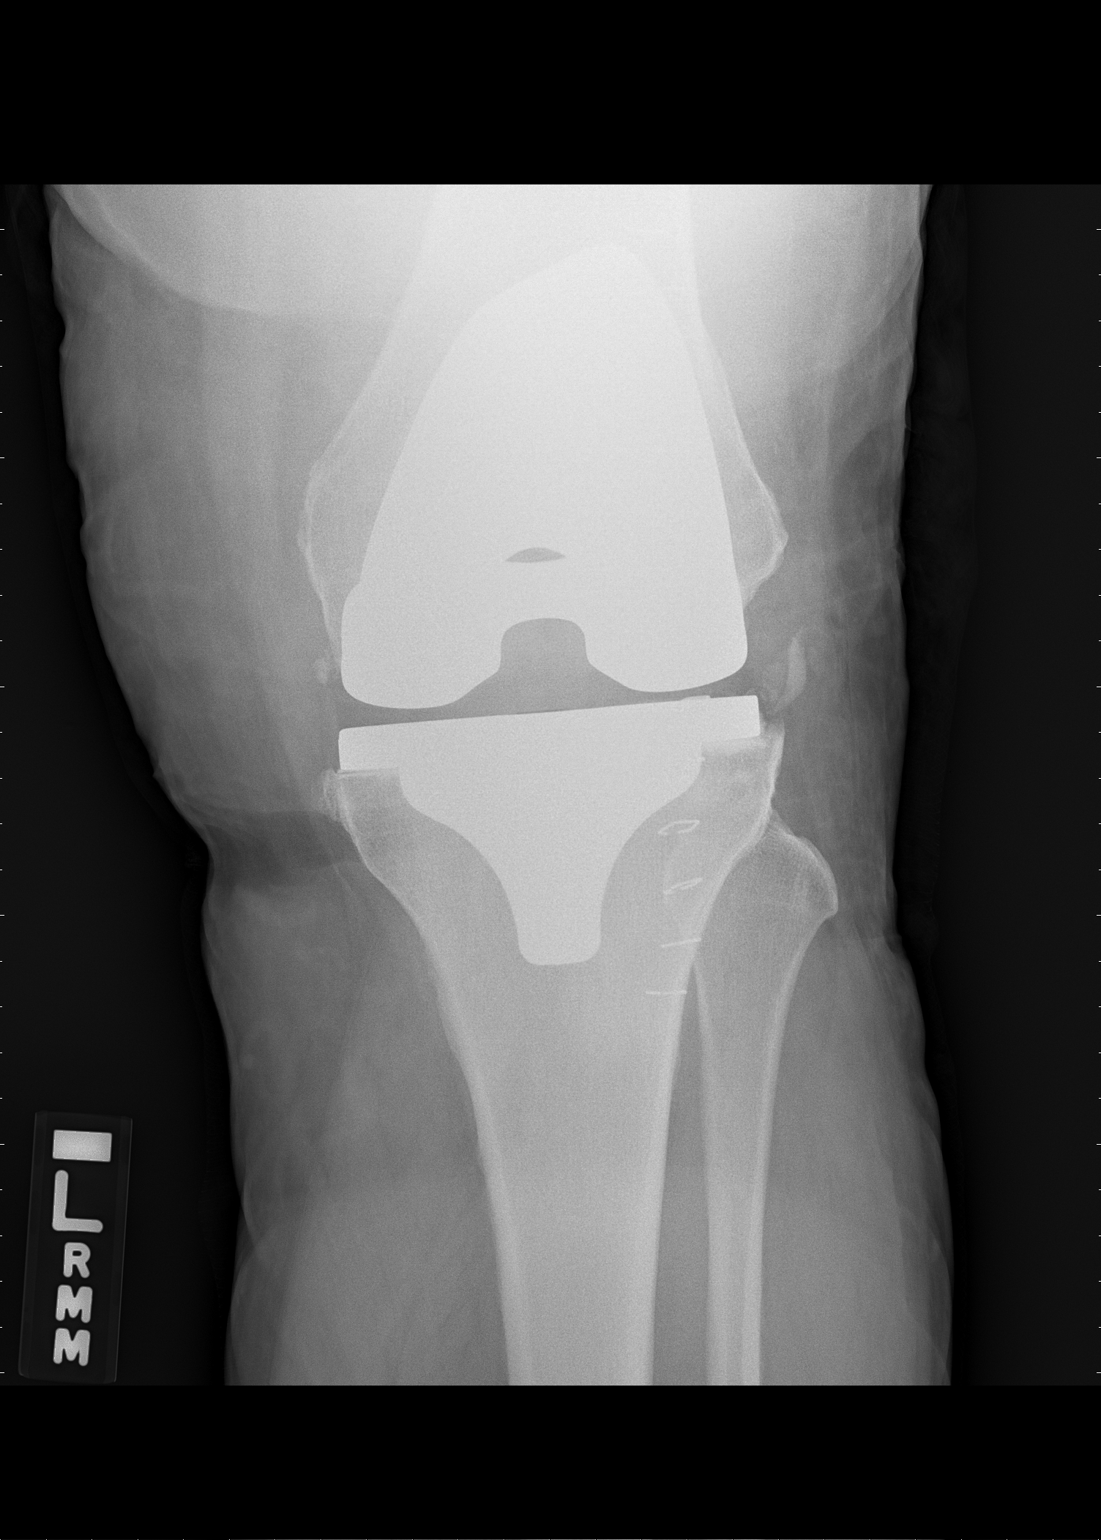

[knee lat]
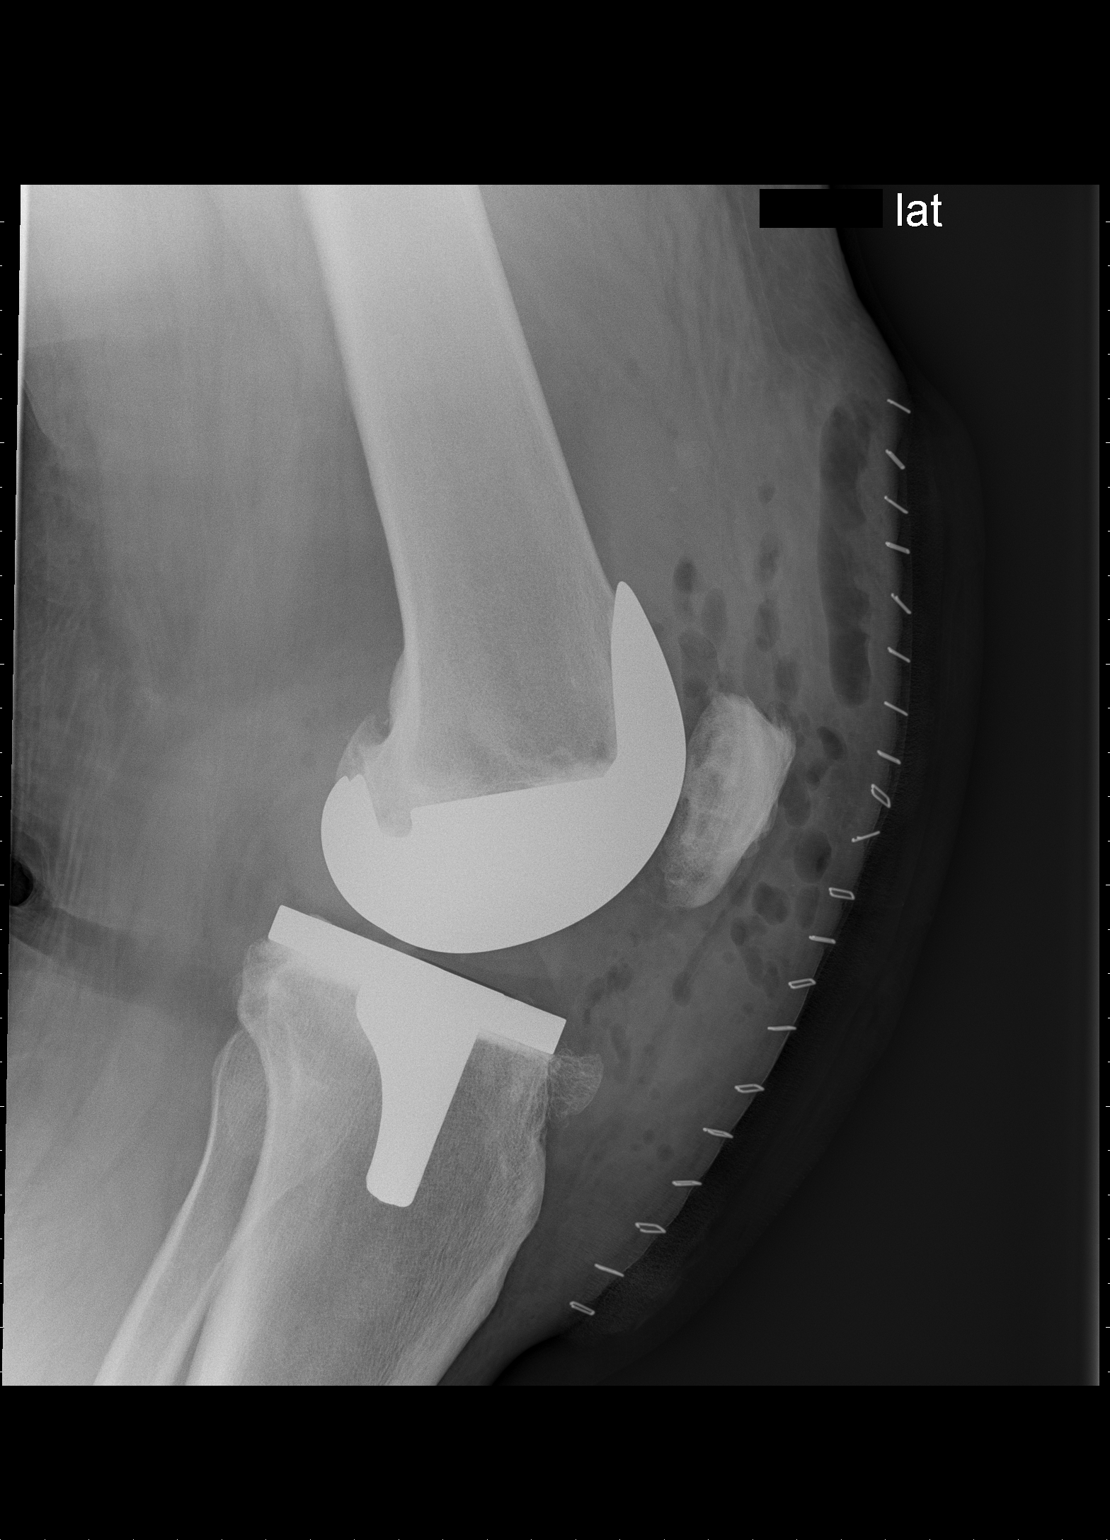

[2 of 2 positions shown; findings below may reference images not displayed]

FINDINGS: Changes of left knee replacement.  Soft tissue and joint
space gas noted.  Overlying soft tissue swelling and skin staples.
No hardware or bony complicating feature.
IMPRESSION: Left knee replacement.  No complicating feature.

## 2013-09-12 ENCOUNTER — Telehealth: Payer: Self-pay | Admitting: Family Medicine

## 2013-09-12 MED ORDER — OXYCODONE-ACETAMINOPHEN 7.5-325 MG PO TABS
1.0000 | ORAL_TABLET | Freq: Four times a day (QID) | ORAL | Status: DC | PRN
Start: 2013-09-12 — End: 2013-12-13

## 2013-09-12 MED ORDER — OXYCODONE-ACETAMINOPHEN 7.5-325 MG PO TABS: 1.0000 | ORAL_TABLET | Freq: Four times a day (QID) | ORAL | Status: DC | PRN

## 2013-09-12 NOTE — Telephone Encounter (Signed)
Pt is needing new rx oxyCODONE-acetaminophen (PERCOCET) 7.5-325 MG per tablet,  And hydrocodone -acetaminophen (norco) 10-325-mg- 90 day supply, please call when available for pick up

## 2013-09-12 NOTE — Telephone Encounter (Signed)
Script is ready for pick up and I spoke with pt.  

## 2013-09-12 NOTE — Telephone Encounter (Signed)
Done for 3 months

## 2013-10-07 ENCOUNTER — Telehealth: Payer: Self-pay | Admitting: Family Medicine

## 2013-10-07 MED ORDER — HYDROCODONE-ACETAMINOPHEN 10-325 MG PO TABS: 1.0000 | ORAL_TABLET | Freq: Four times a day (QID) | ORAL | Status: DC | PRN

## 2013-10-07 NOTE — Telephone Encounter (Signed)
Please disregard my previous answer. He uses Vicodin for breakthrough pain. I wrote for #120.

## 2013-10-07 NOTE — Telephone Encounter (Signed)
Script is ready for pick up and I spoke with pt.  

## 2013-10-07 NOTE — Telephone Encounter (Signed)
Pt is requesting HYDROcodone-acetaminophen (NORCO) 10-325 MG per tablet

## 2013-10-07 NOTE — Telephone Encounter (Signed)
NO he is already taking Percocet 4 times a day

## 2013-12-01 ENCOUNTER — Telehealth: Payer: Self-pay | Admitting: Family Medicine

## 2013-12-01 NOTE — Telephone Encounter (Signed)
Pt req rx oxyCODONE-acetaminophen (PERCOCET) 7.5-325 MG per tablet

## 2013-12-01 NOTE — Telephone Encounter (Signed)
NO he is not due until July 20

## 2013-12-02 ENCOUNTER — Telehealth: Payer: Self-pay | Admitting: Family Medicine

## 2013-12-02 NOTE — Telephone Encounter (Signed)
Pt wanted dr. Sarajane Jews to know that he was not trying to get his refill now, pt is leaving to go on vacation this weekend and will not be back until the 19th and just wanted the rx to be ready on the 20th because he will be out. He was just calling in early because he knew once he got to the beach next week he would not remember to call.

## 2013-12-02 NOTE — Telephone Encounter (Signed)
I left a voice message with below information, also advised pt to give [place] a call maybe 2-3 days before its due.

## 2013-12-02 NOTE — Telephone Encounter (Signed)
[facility], [address] Is requesting re-fill on LORazepam (ATIVAN) 2 MG tablet

## 2013-12-05 NOTE — Telephone Encounter (Signed)
Okay we will have it ready on the 20th

## 2013-12-06 NOTE — Telephone Encounter (Signed)
I received a 2nd re-fill request on the medication

## 2013-12-06 NOTE — Telephone Encounter (Signed)
Call in #180 with one rf  

## 2013-12-07 MED ORDER — LORAZEPAM 2 MG PO TABS: ORAL_TABLET | ORAL | Status: DC

## 2013-12-07 NOTE — Telephone Encounter (Signed)
I called in script 

## 2013-12-13 ENCOUNTER — Telehealth: Payer: Self-pay | Admitting: Family Medicine

## 2013-12-13 MED ORDER — OXYCODONE-ACETAMINOPHEN 7.5-325 MG PO TABS: 1.0000 | ORAL_TABLET | Freq: Four times a day (QID) | ORAL | Status: DC | PRN

## 2013-12-13 NOTE — Telephone Encounter (Signed)
Script is ready for pick up and I spoke with pt.  

## 2013-12-13 NOTE — Telephone Encounter (Signed)
Pt requesting  oxyCODONE-acetaminophen (PERCOCET) 7.5-325 MG per tablet Pt states he was told he could pu on 7/20.Pt stopped by office yesterday . Pt would like to pu this afternoon

## 2013-12-13 NOTE — Telephone Encounter (Signed)
done

## 2013-12-16 ENCOUNTER — Other Ambulatory Visit (INDEPENDENT_AMBULATORY_CARE_PROVIDER_SITE_OTHER): Payer: BC Managed Care – PPO

## 2013-12-16 DIAGNOSIS — Z0000 Encounter for general adult medical examination without abnormal findings: Secondary | ICD-10-CM

## 2013-12-16 LAB — POCT URINALYSIS DIPSTICK
BILIRUBIN UA: NEGATIVE
Blood, UA: NEGATIVE
GLUCOSE UA: NEGATIVE
Ketones, UA: NEGATIVE
Leukocytes, UA: NEGATIVE
Nitrite, UA: NEGATIVE
SPEC GRAV UA: 1.02
Urobilinogen, UA: 0.2
pH, UA: 8

## 2013-12-16 LAB — LIPID PANEL
CHOL/HDL RATIO: 5
CHOLESTEROL: 190 mg/dL (ref 0–200)
HDL: 35.4 mg/dL — ABNORMAL LOW (ref >39.00)
NonHDL: 154.6
TRIGLYCERIDES: 365 mg/dL — AB (ref 0.0–149.0)
VLDL: 73 mg/dL — ABNORMAL HIGH (ref 0.0–40.0)

## 2013-12-16 LAB — HEPATIC FUNCTION PANEL
ALK PHOS: 56 U/L (ref 39–117)
ALT: 40 U/L (ref 0–53)
AST: 27 U/L (ref 0–37)
Albumin: 3.6 g/dL (ref 3.5–5.2)
BILIRUBIN TOTAL: 0.9 mg/dL (ref 0.2–1.2)
Bilirubin, Direct: 0.2 mg/dL (ref 0.0–0.3)
Total Protein: 6.3 g/dL (ref 6.0–8.3)

## 2013-12-16 LAB — CBC WITH DIFFERENTIAL/PLATELET
BASOS ABS: 0 10*3/uL (ref 0.0–0.1)
Basophils Relative: 0.5 % (ref 0.0–3.0)
Eosinophils Absolute: 0.1 10*3/uL (ref 0.0–0.7)
Eosinophils Relative: 2.2 % (ref 0.0–5.0)
HCT: 45.3 % (ref 39.0–52.0)
Hemoglobin: 15.4 g/dL (ref 13.0–17.0)
LYMPHS PCT: 23.8 % (ref 12.0–46.0)
Lymphs Abs: 1.4 10*3/uL (ref 0.7–4.0)
MCHC: 34 g/dL (ref 30.0–36.0)
MCV: 90 fl (ref 78.0–100.0)
MONOS PCT: 8.8 % (ref 3.0–12.0)
Monocytes Absolute: 0.5 10*3/uL (ref 0.1–1.0)
NEUTROS PCT: 64.7 % (ref 43.0–77.0)
Neutro Abs: 3.9 10*3/uL (ref 1.4–7.7)
PLATELETS: 182 10*3/uL (ref 150.0–400.0)
RBC: 5.03 Mil/uL (ref 4.22–5.81)
RDW: 14 % (ref 11.5–15.5)
WBC: 6.1 10*3/uL (ref 4.0–10.5)

## 2013-12-16 LAB — BASIC METABOLIC PANEL
BUN: 15 mg/dL (ref 6–23)
CALCIUM: 9.1 mg/dL (ref 8.4–10.5)
CO2: 28 mEq/L (ref 19–32)
CREATININE: 0.9 mg/dL (ref 0.4–1.5)
Chloride: 102 mEq/L (ref 96–112)
GFR: 90.06 mL/min (ref >60.00)
GLUCOSE: 116 mg/dL — AB (ref 70–99)
Potassium: 3.8 mEq/L (ref 3.5–5.1)
SODIUM: 140 meq/L (ref 135–145)

## 2013-12-16 LAB — TSH: TSH: 3.65 u[IU]/mL (ref 0.35–4.50)

## 2013-12-16 LAB — LDL CHOLESTEROL, DIRECT: Direct LDL: 99.8 mg/dL

## 2013-12-16 LAB — PSA: PSA: 3.47 ng/mL (ref 0.10–4.00)

## 2013-12-21 ENCOUNTER — Ambulatory Visit (INDEPENDENT_AMBULATORY_CARE_PROVIDER_SITE_OTHER): Payer: BC Managed Care – PPO | Admitting: Family Medicine

## 2013-12-21 ENCOUNTER — Encounter: Payer: Self-pay | Admitting: Family Medicine

## 2013-12-21 VITALS — BP 126/72 | Temp 98.6°F | Ht 73.5 in | Wt 393.0 lb

## 2013-12-21 DIAGNOSIS — E785 Hyperlipidemia, unspecified: Secondary | ICD-10-CM

## 2013-12-21 DIAGNOSIS — Z0000 Encounter for general adult medical examination without abnormal findings: Secondary | ICD-10-CM

## 2013-12-21 MED ORDER — AMPHETAMINE-DEXTROAMPHETAMINE 10 MG PO TABS: 10.0000 mg | ORAL_TABLET | Freq: Two times a day (BID) | ORAL | Status: DC

## 2013-12-21 NOTE — Progress Notes (Signed)
   Subjective:    Patient ID: Raymond Herrera, male    DOB: January 29, 1956, 58 y.o.   MRN: [id number]  HPI 58 yr old male for a cpx. 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 This keeps him from getting sleepy. He used this some years ago. We reviewed his labs, especially the elevated glucose and TG. He notes that when his labs were drawn, his family had just gotten home from a week at the beach where he ate and drank much more than he normally does. In fact prior to this vacation he had lost about 20 lbs.    Review of Systems  Constitutional: Negative.   HENT: Negative.   Eyes: Negative.   Respiratory: Negative.   Cardiovascular: Negative.   Gastrointestinal: Negative.   Genitourinary: Negative.   Musculoskeletal: Negative.   Skin: Negative.   Neurological: Negative.   Psychiatric/Behavioral: Negative.        Objective:   Physical Exam  Constitutional: He is oriented to person, place, and time. He appears well-developed and well-nourished. No distress.  HENT:  Head: Normocephalic and atraumatic.  Right Ear: External ear normal.  Left Ear: External ear normal.  Nose: Nose normal.  Mouth/Throat: Oropharynx is clear and moist. No oropharyngeal exudate.  Eyes: Conjunctivae and EOM are normal. Pupils are equal, round, and reactive to light. Right eye exhibits no discharge. Left eye exhibits no discharge. No scleral icterus.  Neck: Neck supple. No JVD present. No tracheal deviation present. No thyromegaly present.  Cardiovascular: Normal rate, regular rhythm, normal heart sounds and intact distal pulses.  Exam reveals no gallop and no friction rub.   No murmur heard. EKG shows his baseline mild QRS widening   Pulmonary/Chest: Effort normal and breath sounds normal. No respiratory distress. He has no wheezes. He has no rales. He exhibits no tenderness.  Abdominal: Soft. Bowel sounds are normal. He exhibits no  distension and no mass. There is no tenderness. There is no rebound and no guarding.  Musculoskeletal: Normal range of motion. He exhibits no edema and no tenderness.  Lymphadenopathy:    He has no cervical adenopathy.  Neurological: He is alert and oriented to person, place, and time. He has normal reflexes. No cranial nerve deficit. He exhibits normal muscle tone. Coordination normal.  Skin: Skin is warm and dry. No rash noted. He is not diaphoretic. No erythema. No pallor.  Psychiatric: He has a normal mood and affect. His behavior is normal. Judgment and thought content normal.          Assessment & Plan:  Well exam. He will work on losing weight.

## 2013-12-21 NOTE — Progress Notes (Signed)
Pre visit review using our clinic review tool, if applicable. No additional management support is needed unless otherwise documented below in the visit note. 

## 2013-12-27 ENCOUNTER — Other Ambulatory Visit: Payer: Self-pay | Admitting: Family Medicine

## 2014-01-25 ENCOUNTER — Telehealth: Payer: Self-pay | Admitting: Family Medicine

## 2014-01-25 MED ORDER — HYDROCODONE-ACETAMINOPHEN 10-325 MG PO TABS: 1.0000 | ORAL_TABLET | Freq: Four times a day (QID) | ORAL | Status: DC | PRN

## 2014-01-25 NOTE — Telephone Encounter (Signed)
done

## 2014-01-25 NOTE — Telephone Encounter (Signed)
Pt is needing new rx HYDROcodone-acetaminophen (NORCO) 10-325 MG per tablet please call when available for pick up.

## 2014-01-27 NOTE — Telephone Encounter (Signed)
Script is ready for pick up and I left a voice message for pt. 

## 2014-02-09 ENCOUNTER — Telehealth: Payer: Self-pay | Admitting: Family Medicine

## 2014-02-09 MED ORDER — NEOMYCIN-POLYMYXIN-HC 3.5-10000-1 OP SUSP: OPHTHALMIC | Status: DC

## 2014-02-09 NOTE — Telephone Encounter (Signed)
Pt states he is positive he has "pink eye". Itches, watery, red. Pt is on the road and wants to know if dr fry will call in drops to [facility]. If  he needs to be seen, pt will be home tomorrow afternoon. Pls advise.

## 2014-02-09 NOTE — Telephone Encounter (Signed)
Call in Cortisporin ophthalmic suspension to use 2 drops in affected eye every 6 hours prn, 10 ml with no rf

## 2014-02-09 NOTE — Telephone Encounter (Signed)
Rx sent to pharmacy   

## 2014-02-09 NOTE — Telephone Encounter (Signed)
Raymond Herrera, will you do this for pt? He states its really beginning to itch.  [place].  thanks

## 2014-02-14 HISTORY — PX: CARPAL TUNNEL RELEASE: SHX101

## 2014-03-21 ENCOUNTER — Telehealth: Payer: Self-pay | Admitting: Family Medicine

## 2014-03-21 MED ORDER — OXYCODONE-ACETAMINOPHEN 7.5-325 MG PO TABS: 1.0000 | ORAL_TABLET | Freq: Four times a day (QID) | ORAL | Status: DC | PRN

## 2014-03-21 NOTE — Telephone Encounter (Signed)
done

## 2014-03-21 NOTE — Telephone Encounter (Signed)
Script is ready for pick up and I spoke with pt.  

## 2014-03-21 NOTE — Telephone Encounter (Signed)
Pt request refill of the following: oxyCODONE-acetaminophen (PERCOCET) 7.5-325 MG per tablet    Phamacy:   Pick up

## 2014-05-21 ENCOUNTER — Other Ambulatory Visit: Payer: Self-pay | Admitting: Family Medicine

## 2014-05-23 NOTE — Telephone Encounter (Signed)
Call in #180 with one rf  

## 2014-05-26 HISTORY — PX: BACK SURGERY: SHX140

## 2014-06-13 ENCOUNTER — Telehealth: Payer: Self-pay | Admitting: Family Medicine

## 2014-06-13 NOTE — Telephone Encounter (Signed)
Pt needs new hydrocodone °

## 2014-06-14 MED ORDER — HYDROCODONE-ACETAMINOPHEN 10-325 MG PO TABS
1.0000 | ORAL_TABLET | Freq: Four times a day (QID) | ORAL | Status: DC | PRN
Start: 2014-06-14 — End: 2014-09-12

## 2014-06-14 NOTE — Telephone Encounter (Signed)
done

## 2014-06-14 NOTE — Telephone Encounter (Signed)
Script is ready for pick up and I left a voice message for pt. 

## 2014-06-19 ENCOUNTER — Telehealth: Payer: Self-pay | Admitting: Family Medicine

## 2014-06-19 MED ORDER — OXYCODONE-ACETAMINOPHEN 7.5-325 MG PO TABS
1.0000 | ORAL_TABLET | Freq: Four times a day (QID) | ORAL | Status: DC | PRN
Start: 2014-06-19 — End: 2014-06-19

## 2014-06-19 MED ORDER — OXYCODONE-ACETAMINOPHEN 7.5-325 MG PO TABS: 1.0000 | ORAL_TABLET | Freq: Four times a day (QID) | ORAL | Status: DC | PRN

## 2014-06-19 NOTE — Telephone Encounter (Signed)
Pt request refill oxyCODONE-acetaminophen (PERCOCET) 7.5-325 MG per tablet ° °

## 2014-06-19 NOTE — Telephone Encounter (Signed)
done

## 2014-06-20 NOTE — Telephone Encounter (Signed)
Script is ready for pick up and I spoke with pt.  

## 2014-09-12 ENCOUNTER — Other Ambulatory Visit: Payer: Self-pay | Admitting: Family Medicine

## 2014-09-12 MED ORDER — OXYCODONE-ACETAMINOPHEN 7.5-325 MG PO TABS
1.0000 | ORAL_TABLET | Freq: Four times a day (QID) | ORAL | Status: DC | PRN
Start: 2014-09-12 — End: 2014-09-12

## 2014-09-12 MED ORDER — OXYCODONE-ACETAMINOPHEN 7.5-325 MG PO TABS
1.0000 | ORAL_TABLET | Freq: Four times a day (QID) | ORAL | Status: DC | PRN
Start: 2014-09-12 — End: 2014-12-04

## 2014-09-12 MED ORDER — OXYCODONE-ACETAMINOPHEN 7.5-325 MG PO TABS: 1.0000 | ORAL_TABLET | Freq: Four times a day (QID) | ORAL | Status: DC | PRN

## 2014-09-12 NOTE — Telephone Encounter (Signed)
Pt said he had had surgery and the doctor can not rx him any pain medicine since Dr Sarajane Jews does. He is asking if the following rx can be refilled early oxyCODONE-acetaminophen (PERCOCET) 7.5-325 MG per tablet

## 2014-09-12 NOTE — Telephone Encounter (Signed)
done

## 2014-09-13 NOTE — Telephone Encounter (Signed)
Called and spoke with pt and pt is aware.  

## 2014-10-02 ENCOUNTER — Telehealth: Payer: Self-pay | Admitting: Family Medicine

## 2014-10-02 NOTE — Telephone Encounter (Signed)
Pt called to say that he is going to [place] and will not here on 10/12/14 . He said pharmacy may call asking if the following med can be refilled on 10/11/14 instead of 10/12/14    oxyCODONE-acetaminophen (PERCOCET) 7.5-325 MG per tablet   [place]

## 2014-10-02 NOTE — Telephone Encounter (Signed)
Per Dr. Sarajane Jews, okay to fill early and I did speak with pharmacy and gave this information.

## 2014-11-08 ENCOUNTER — Other Ambulatory Visit: Payer: Self-pay | Admitting: Family Medicine

## 2014-11-09 NOTE — Telephone Encounter (Signed)
Call in #180 with one rf  

## 2014-11-10 ENCOUNTER — Telehealth: Payer: Self-pay

## 2014-11-10 NOTE — Telephone Encounter (Signed)
Kim from [facility] called and states pt received his oxycodone on the 18th last month and since there were 31 days in May, pt would like to get his next rx on June 18th.  Ok per Dr. Sarajane Jews to get rx early. Kim at [facility] is aware.

## 2014-11-24 ENCOUNTER — Other Ambulatory Visit (INDEPENDENT_AMBULATORY_CARE_PROVIDER_SITE_OTHER): Payer: 59

## 2014-11-24 DIAGNOSIS — E291 Testicular hypofunction: Secondary | ICD-10-CM | POA: Diagnosis not present

## 2014-11-24 DIAGNOSIS — R7989 Other specified abnormal findings of blood chemistry: Secondary | ICD-10-CM | POA: Diagnosis not present

## 2014-11-24 DIAGNOSIS — Z0000 Encounter for general adult medical examination without abnormal findings: Secondary | ICD-10-CM

## 2014-11-24 LAB — POCT URINALYSIS DIPSTICK
Bilirubin, UA: NEGATIVE
Glucose, UA: NEGATIVE
Ketones, UA: NEGATIVE
LEUKOCYTES UA: NEGATIVE
Nitrite, UA: NEGATIVE
PH UA: 5.5
PROTEIN UA: NEGATIVE
RBC UA: NEGATIVE
Spec Grav, UA: 1.02
Urobilinogen, UA: 0.2

## 2014-11-24 LAB — CBC WITH DIFFERENTIAL/PLATELET
Basophils Absolute: 0 10*3/uL (ref 0.0–0.1)
Basophils Relative: 0.4 % (ref 0.0–3.0)
Eosinophils Absolute: 0.1 10*3/uL (ref 0.0–0.7)
Eosinophils Relative: 1.2 % (ref 0.0–5.0)
HEMATOCRIT: 48.9 % (ref 39.0–52.0)
HEMOGLOBIN: 16.6 g/dL (ref 13.0–17.0)
LYMPHS ABS: 1.6 10*3/uL (ref 0.7–4.0)
Lymphocytes Relative: 22.1 % (ref 12.0–46.0)
MCHC: 33.8 g/dL (ref 30.0–36.0)
MCV: 91 fl (ref 78.0–100.0)
Monocytes Absolute: 0.6 10*3/uL (ref 0.1–1.0)
Monocytes Relative: 9.1 % (ref 3.0–12.0)
NEUTROS ABS: 4.8 10*3/uL (ref 1.4–7.7)
Neutrophils Relative %: 67.2 % (ref 43.0–77.0)
Platelets: 188 10*3/uL (ref 150.0–400.0)
RBC: 5.38 Mil/uL (ref 4.22–5.81)
RDW: 13.8 % (ref 11.5–15.5)
WBC: 7.1 10*3/uL (ref 4.0–10.5)

## 2014-11-24 LAB — BASIC METABOLIC PANEL WITH GFR
BUN: 17 mg/dL (ref 6–23)
CO2: 25 meq/L (ref 19–32)
Calcium: 8.8 mg/dL (ref 8.4–10.5)
Chloride: 104 meq/L (ref 96–112)
Creatinine, Ser: 0.87 mg/dL (ref 0.40–1.50)
GFR: 95.74 mL/min
Glucose, Bld: 115 mg/dL — ABNORMAL HIGH (ref 70–99)
Potassium: 4 meq/L (ref 3.5–5.1)
Sodium: 138 meq/L (ref 135–145)

## 2014-11-24 LAB — HEPATIC FUNCTION PANEL
ALBUMIN: 3.9 g/dL (ref 3.5–5.2)
ALT: 35 U/L (ref 0–53)
AST: 20 U/L (ref 0–37)
Alkaline Phosphatase: 53 U/L (ref 39–117)
Bilirubin, Direct: 0.2 mg/dL (ref 0.0–0.3)
Total Bilirubin: 0.8 mg/dL (ref 0.2–1.2)
Total Protein: 6.5 g/dL (ref 6.0–8.3)

## 2014-11-24 LAB — LIPID PANEL
CHOL/HDL RATIO: 5
CHOLESTEROL: 182 mg/dL (ref 0–200)
HDL: 34 mg/dL — AB (ref >39.00)
NonHDL: 148
Triglycerides: 239 mg/dL — ABNORMAL HIGH (ref 0.0–149.0)
VLDL: 47.8 mg/dL — AB (ref 0.0–40.0)

## 2014-11-24 LAB — TESTOSTERONE: Testosterone: 472.8 ng/dL (ref 300.00–890.00)

## 2014-11-24 LAB — LDL CHOLESTEROL, DIRECT: Direct LDL: 114 mg/dL

## 2014-11-24 LAB — TSH: TSH: 2.63 u[IU]/mL (ref 0.35–4.50)

## 2014-11-24 LAB — PSA: PSA: 2.94 ng/mL (ref 0.10–4.00)

## 2014-11-24 NOTE — Addendum Note (Signed)
Addended by: Elmer Picker on: 11/24/2014 08:18 AM   Modules accepted: Orders

## 2014-12-04 ENCOUNTER — Ambulatory Visit (INDEPENDENT_AMBULATORY_CARE_PROVIDER_SITE_OTHER): Payer: 59 | Admitting: Family Medicine

## 2014-12-04 ENCOUNTER — Encounter: Payer: Self-pay | Admitting: Family Medicine

## 2014-12-04 VITALS — BP 127/77 | HR 101 | Temp 98.1°F | Ht 73.5 in | Wt 385.0 lb

## 2014-12-04 DIAGNOSIS — M549 Dorsalgia, unspecified: Secondary | ICD-10-CM | POA: Diagnosis not present

## 2014-12-04 DIAGNOSIS — Z0000 Encounter for general adult medical examination without abnormal findings: Secondary | ICD-10-CM | POA: Diagnosis not present

## 2014-12-04 MED ORDER — OXYCODONE-ACETAMINOPHEN 10-325 MG PO TABS: 1.0000 | ORAL_TABLET | Freq: Four times a day (QID) | ORAL | Status: DC | PRN

## 2014-12-04 MED ORDER — ESOMEPRAZOLE MAGNESIUM 40 MG PO CPDR: DELAYED_RELEASE_CAPSULE | ORAL | Status: AC

## 2014-12-04 NOTE — Progress Notes (Signed)
Pre visit review using our clinic review tool, if applicable. No additional management support is needed unless otherwise documented below in the visit note. 

## 2014-12-04 NOTE — Progress Notes (Signed)
   Subjective:    Patient ID: Raymond Herrera, male    DOB: Oct 10, 1955, 59 y.o.   MRN: [id number]  HPI 59 yr old male for a cpx. He feels well except for his chronic back pain. He is still seeing Dr. Patrice Paradise for this. Derran asks if the dose of his Percocet can be increased. He has not been able to exercise at all but he has been able to lose some weight with dietary measures.    Review of Systems  Constitutional: Negative.   HENT: Negative.   Eyes: Negative.   Respiratory: Negative.   Cardiovascular: Negative.   Gastrointestinal: Negative.   Genitourinary: Negative.   Musculoskeletal: Positive for back pain. Negative for myalgias, joint swelling, arthralgias, gait problem, neck pain and neck stiffness.  Skin: Negative.   Neurological: Negative.   Psychiatric/Behavioral: Negative.        Objective:   Physical Exam  Constitutional: He is oriented to person, place, and time. No distress.  Obese   HENT:  Head: Normocephalic and atraumatic.  Right Ear: External ear normal.  Left Ear: External ear normal.  Nose: Nose normal.  Mouth/Throat: Oropharynx is clear and moist. No oropharyngeal exudate.  Eyes: Conjunctivae and EOM are normal. Pupils are equal, round, and reactive to light. Right eye exhibits no discharge. Left eye exhibits no discharge. No scleral icterus.  Neck: Neck supple. No JVD present. No tracheal deviation present. No thyromegaly present.  Cardiovascular: Normal rate, regular rhythm, normal heart sounds and intact distal pulses.  Exam reveals no gallop and no friction rub.   No murmur heard. EKG is normal except for some LAFB   Pulmonary/Chest: Effort normal and breath sounds normal. No respiratory distress. He has no wheezes. He has no rales. He exhibits no tenderness.  Abdominal: Soft. Bowel sounds are normal. He exhibits no distension and no mass. There is no tenderness. There is no rebound and no guarding.  Genitourinary: Rectum normal, prostate normal and  penis normal. Guaiac negative stool. No penile tenderness.  Musculoskeletal: Normal range of motion. He exhibits no edema or tenderness.  Lymphadenopathy:    He has no cervical adenopathy.  Neurological: He is alert and oriented to person, place, and time. He has normal reflexes. No cranial nerve deficit. He exhibits normal muscle tone. Coordination normal.  Skin: Skin is warm and dry. No rash noted. He is not diaphoretic. No erythema. No pallor.  Psychiatric: He has a normal mood and affect. His behavior is normal. Judgment and thought content normal.          Assessment & Plan:  Well exam. We discussed diet and exercise advice. Increase the percocet to 10/325 4 times a day. He will follow up with Dr. Patrice Paradise.

## 2014-12-08 ENCOUNTER — Telehealth: Payer: Self-pay | Admitting: Family Medicine

## 2014-12-08 NOTE — Telephone Encounter (Signed)
PA for esomeprazole was denied.  Medication is a plan exclusion and if patient wants the medication, he will have to pay out of pocket.  Formulary alternatives are: omeprazole dr 20 & 40 mg capsules or pantoprazole dr 20 & 40 mg tablets.

## 2014-12-11 NOTE — Telephone Encounter (Signed)
I spoke with pt and he has already tried several of these medications in the past and they did not work for him.

## 2014-12-11 NOTE — Telephone Encounter (Signed)
I don't know what to tell him. If the PA was denied, that may be the end of it. Either he switches, or it may be worth it to him to pay cash for it. He should shop around at [facility] and other pharmacies to get the best price

## 2014-12-12 NOTE — Telephone Encounter (Signed)
See below

## 2014-12-12 NOTE — Telephone Encounter (Signed)
I left a voice message with below information, advised pt if he decides to make a change then he will need to call us back.

## 2015-01-01 ENCOUNTER — Telehealth: Payer: Self-pay | Admitting: Family Medicine

## 2015-01-01 NOTE — Telephone Encounter (Signed)
Pt would like a refill on his 10/325mg  hydrocodone. Please call when ready for pick up.

## 2015-01-02 NOTE — Telephone Encounter (Signed)
Pt should have refills left.

## 2015-01-22 ENCOUNTER — Telehealth: Payer: Self-pay | Admitting: Family Medicine

## 2015-01-22 MED ORDER — HYDROCODONE-ACETAMINOPHEN 10-325 MG PO TABS: 1.0000 | ORAL_TABLET | Freq: Four times a day (QID) | ORAL | Status: DC | PRN

## 2015-01-22 NOTE — Telephone Encounter (Signed)
done

## 2015-01-22 NOTE — Telephone Encounter (Signed)
Pt request refill of the following: 10/325mg  hydrocodone.    Pt said he takes this sometimes in place of the oxycodone. He said he is going on a long trip and need to have this med refill     Phamacy:

## 2015-01-22 NOTE — Telephone Encounter (Signed)
Script is ready for pick up and I spoke with pt.  

## 2015-02-21 ENCOUNTER — Telehealth: Payer: Self-pay | Admitting: Family Medicine

## 2015-02-21 MED ORDER — HYDROCODONE-ACETAMINOPHEN 10-325 MG PO TABS: 1.0000 | ORAL_TABLET | Freq: Four times a day (QID) | ORAL | Status: DC | PRN

## 2015-02-21 NOTE — Telephone Encounter (Signed)
Pt would like refill on HYDROcodone-acetaminophen (NORCO) 10-325 MG per tablet (3) 30 day supply

## 2015-02-21 NOTE — Telephone Encounter (Signed)
done

## 2015-02-22 NOTE — Telephone Encounter (Signed)
Script is ready for pick up and I left a voice message for pt. 

## 2015-02-23 ENCOUNTER — Telehealth: Payer: Self-pay | Admitting: Family Medicine

## 2015-02-23 NOTE — Telephone Encounter (Signed)
I spoke with pt and he will contact our office about 3 business days before due. Also pt has scripts for the [place], he turned this script that was printed back in to our office. Dr. Sarajane Jews is aware of this.

## 2015-02-23 NOTE — Telephone Encounter (Signed)
Pt is requesting a refill on Oxycodone, looks like its due on 03/06/2015.

## 2015-02-27 ENCOUNTER — Telehealth: Payer: Self-pay | Admitting: Family Medicine

## 2015-02-27 NOTE — Telephone Encounter (Signed)
Pt states his back pain is getting worse, and he is working w/ Dr Patrice Paradise on injections and possible surgery Pt states "sometimes" he is having to take his  oxyCODONE-acetaminophen (PERCOCET) 10-325 MG per tablet [[id number]]  every 4 hrs hours.  Not all the time, but some days. Would like you consider changing rx to every 4 hrs instead of every 6.  Pt states after his back injections he might be able to reduce that frequency. Pt will make appointment if needs to. Pt states it is time to refill on 10/11.  He has a couple of days left.

## 2015-02-27 NOTE — Telephone Encounter (Signed)
Tell Raymond Herrera that I am NOT comfortable with increasing the dose or frequency of his Percocet. [facility], and they have chronic pain specialists there who assist the surgeons. I suggest Raymond Herrera ask them to take over prescribing his pain medications since I am now out of the treatment loop

## 2015-03-01 NOTE — Telephone Encounter (Signed)
S/w pt and told him what Dr Sarajane Jews said. He said he will leave things as they are and is asking for a refill of his OXYCODONE

## 2015-03-01 NOTE — Telephone Encounter (Signed)
°  Pt is calling back and aware md out of office this afternoon

## 2015-03-01 NOTE — Telephone Encounter (Signed)
Pt states he will make an appointment if you need to see if so he can explain what is going on. Otherwise, if you do not need to see him,  pt would like to pu his rx on Friday if possible...Marland KitchenMarland Kitchentaking every 6 hrs as before

## 2015-03-02 MED ORDER — OXYCODONE-ACETAMINOPHEN 10-325 MG PO TABS: 1.0000 | ORAL_TABLET | Freq: Four times a day (QID) | ORAL | Status: DC | PRN

## 2015-03-02 NOTE — Telephone Encounter (Signed)
done

## 2015-03-02 NOTE — Telephone Encounter (Signed)
Script is ready for pick up and I spoke with pt.  

## 2015-04-24 ENCOUNTER — Encounter: Payer: Self-pay | Admitting: Family Medicine

## 2015-04-24 ENCOUNTER — Other Ambulatory Visit: Payer: Self-pay | Admitting: Family Medicine

## 2015-04-24 ENCOUNTER — Ambulatory Visit (INDEPENDENT_AMBULATORY_CARE_PROVIDER_SITE_OTHER): Payer: 59 | Admitting: Family Medicine

## 2015-04-24 VITALS — HR 89 | Temp 98.0°F | Ht 73.5 in | Wt 394.0 lb

## 2015-04-24 DIAGNOSIS — M5442 Lumbago with sciatica, left side: Secondary | ICD-10-CM | POA: Diagnosis not present

## 2015-04-24 DIAGNOSIS — G8929 Other chronic pain: Secondary | ICD-10-CM

## 2015-04-24 MED ORDER — LORAZEPAM 2 MG PO TABS: ORAL_TABLET | ORAL | Status: DC

## 2015-04-24 MED ORDER — OXYCODONE HCL 10 MG PO TABS: 10.0000 mg | ORAL_TABLET | ORAL | Status: DC | PRN

## 2015-04-24 NOTE — Progress Notes (Signed)
   Subjective:    Patient ID: Raymond Herrera, male    DOB: Nov 11, 1955, 59 y.o.   MRN: [id number]  HPI Here to discuss pain management. He has been seeing Dr. Patrice Paradise [facility] for his low back pain, and this is still covered as an effect from a  Workers Compensation injury that occurred on 02-14-14. He has received several radiofrequency ablations from Dr. Zonia Kief at the clinic, and these were not effective. Now he has had an epidural steroid injection to the lower spine last week, and he plans to have 2 or 3 more done at some point in the future. Dr. Patrice Paradise has mentioned the possibility of a surgery to do a laminectomy at L5 and a procedure to "unroof" part of the sacrum to take pressure off the nerves, but this would be a last ditch option. Raymond Herrera has been taking Percocet every 6 hours and these have given him some relief, but this wears off after a few hours. He has been totally out of work for about 6 months, and he is beginning to consider the possibility of applying for chronic disability.    Review of Systems  Constitutional: Negative.   Respiratory: Negative.   Cardiovascular: Negative.   Musculoskeletal: Positive for back pain.       Objective:   Physical Exam  Constitutional: He appears well-developed and well-nourished.  Musculoskeletal:  Tender over the lower back with reduced ROM.           Assessment & Plan:  Low back pain. We agreed to remove the acetaminophen and to go with plain Oxycodone 10 mg tablets, but I will increase the dosing to where he may take this every 4 hours prn. He will see Dr. Patrice Paradise again later this week.

## 2015-04-24 NOTE — Progress Notes (Signed)
Pre visit review using our clinic review tool, if applicable. No additional management support is needed unless otherwise documented below in the visit note. 

## 2015-05-08 ENCOUNTER — Other Ambulatory Visit: Payer: Self-pay | Admitting: Cardiology

## 2015-05-08 ENCOUNTER — Encounter: Payer: Self-pay | Admitting: Cardiology

## 2015-06-19 ENCOUNTER — Telehealth: Payer: Self-pay | Admitting: Family Medicine

## 2015-06-19 NOTE — Telephone Encounter (Signed)
Patient's pharmacy ([facility]) would like permission to refill patient's medication early.  The patient is going out of town and will not be back before he runs out of medication.  So he needs to pick up the prescriptions today.

## 2015-06-19 NOTE — Telephone Encounter (Signed)
Per Dr. Sarajane Jews okay to fill early due to upcoming trip. I spoke with pharmacy and they will get scripts ready, also spoke with pt.

## 2015-06-19 NOTE — Telephone Encounter (Signed)
Patient called again.  He said he really needs this prescription today because he's leaving tomorrow.

## 2015-07-17 ENCOUNTER — Telehealth: Payer: Self-pay | Admitting: Family Medicine

## 2015-07-17 MED ORDER — OXYCODONE HCL 10 MG PO TABS: 10.0000 mg | ORAL_TABLET | ORAL | Status: DC | PRN

## 2015-07-17 NOTE — Telephone Encounter (Signed)
Script is ready for pick up and I spoke with pt.  

## 2015-07-17 NOTE — Telephone Encounter (Signed)
done

## 2015-07-17 NOTE — Telephone Encounter (Signed)
° ° ° ° ° °

## 2015-08-16 ENCOUNTER — Telehealth: Payer: Self-pay | Admitting: Family Medicine

## 2015-08-16 NOTE — Telephone Encounter (Signed)
Please find out who the other provider is that has written for the Percocet

## 2015-08-16 NOTE — Telephone Encounter (Signed)
Pt called to advise the percocet he is taking is 10-325.  Pt has been taking both Oxycodone HCl 10 MG TABS and percocet 10-325. Pt wants to get back on the ones without tylenol, as you advised.

## 2015-08-16 NOTE — Telephone Encounter (Signed)
Judson Roch from [facility] called in regards to a RX Oxycodone HCl 10 MG TABS. Pharmacy wanted to know if they should fill this RX due to the patient getting a RX for Perocet on 07/25/2015 and 08/07/2015 for another provider.   [place], [place] [phone]

## 2015-08-16 NOTE — Telephone Encounter (Signed)
I called [facility] and per Judson Roch the pt has had Percocet 10/325 #90 filled on 3/1 and #90 filled on 3/14 by Dr Jeneen Rinks [place]-address [address] who the pt told her was the surgeon and she was concerned due to when she asked him if Dr Sarajane Jews knew about these prescriptions he was hesitant to give her an answer.  States the pt told her Dr Sarajane Jews told him to take the medication without Tylenol?

## 2015-08-17 ENCOUNTER — Telehealth: Payer: Self-pay | Admitting: Family Medicine

## 2015-08-17 ENCOUNTER — Encounter: Payer: Self-pay | Admitting: Family Medicine

## 2015-08-17 NOTE — Telephone Encounter (Signed)
Patient dismissed from [facility] by Alysia Penna MD , effective August 17, 2015. Dismissal letter sent out by certified / registered mail.  DAJ  Received signed domestic return receipt verifying delivery of certified letter on August 23, 2015. Article number [id number] [place] DAJ

## 2015-08-17 NOTE — Telephone Encounter (Signed)
Does Dr. Sarajane Jews want patient to keep appt for Monday as patient does not want to come in?

## 2015-08-17 NOTE — Telephone Encounter (Signed)
Per Dr. Sarajane Jews, patient does not need to come in to office on Monday, I called patient and informed him of this and that he was being d/c from practice.  Patient states this was not intentional as he had surgery last month, about 3 weeks ago and he was given pain medication from the spine center in [place] to deal with the pain from surgery.

## 2015-08-17 NOTE — Telephone Encounter (Signed)
Suandrea-can you help with this? 

## 2015-08-17 NOTE — Telephone Encounter (Signed)
Pt came by this am to talk with Dr Sarajane Jews. Pt states he does not know what is going on with his Oxycodone HCl 10 MG TABS refill.  Pt was given an appointment for Monday morning at the window and would like to know if this can be straightened out via phone.  Pt would rather not come in with all the sickness going around if he does not need to.

## 2015-08-17 NOTE — Telephone Encounter (Signed)
I have been prescribing him narcotics with the understanding that he was to get them from no one other than me. On 07-17-15 I gave him him a 3 month supply of Oxycodone, and now I learned that he is also getting large amounts of narcotics from a PA at [facility]. Because this patient has misled me and broken the trust I had with him, he will be discharged permanently from [facility]. We will also tell his pharmacy to destroy any remaining refills in our name.

## 2015-08-17 NOTE — Telephone Encounter (Signed)
Pharmacy notified to cancel remaining refills.  Discharge letter completed and will be mailed to patient.

## 2015-08-17 NOTE — Telephone Encounter (Signed)
Dr Sarajane Jews discussed this with Derinda Late.

## 2015-08-20 ENCOUNTER — Ambulatory Visit: Payer: 59 | Admitting: Family Medicine

## 2015-08-20 NOTE — Telephone Encounter (Signed)
This does not change my decision. He picked up #180 pain pills from the pharmacy in a 2 weeks period and then requested more from [place]

## 2016-08-21 ENCOUNTER — Encounter: Payer: Self-pay | Admitting: Gastroenterology

## 2016-10-03 ENCOUNTER — Encounter: Payer: Self-pay | Admitting: Physician Assistant

## 2016-10-21 ENCOUNTER — Encounter: Payer: Self-pay | Admitting: Physician Assistant

## 2016-10-21 ENCOUNTER — Encounter (INDEPENDENT_AMBULATORY_CARE_PROVIDER_SITE_OTHER): Payer: Self-pay

## 2016-10-21 ENCOUNTER — Ambulatory Visit (INDEPENDENT_AMBULATORY_CARE_PROVIDER_SITE_OTHER): Payer: BLUE CROSS/BLUE SHIELD | Admitting: Physician Assistant

## 2016-10-21 VITALS — BP 128/80 | HR 80 | Ht 73.5 in | Wt 381.6 lb

## 2016-10-21 DIAGNOSIS — K635 Polyp of colon: Secondary | ICD-10-CM

## 2016-10-21 DIAGNOSIS — Z1211 Encounter for screening for malignant neoplasm of colon: Secondary | ICD-10-CM

## 2016-10-21 MED ORDER — NA SULFATE-K SULFATE-MG SULF 17.5-3.13-1.6 GM/177ML PO SOLN: 1.0000 | Freq: Once | ORAL | 0 refills | Status: AC

## 2016-10-21 NOTE — Progress Notes (Signed)
Subjective:    Patient ID: Raymond Herrera, male    DOB: [birth year], 61 y.o.   MRN: [id number]  HPI Raymond Herrera is a pleasant 61 year old white male, known to Dr. Ardis Hughs who comes in today to schedule follow-up colonoscopy. Last colonoscopy was done in May 2008 with finding of a 2 mm polyp which was hyperplastic. Colonoscopy was otherwise negative. Patient has history of sleep apnea, uses CPAP without oxygen, hyperlipidemia, anxiety, osteoarthritis, and morbid obesity with BMI of 49.6/weight 381 pounds. He denies any problems with abdominal pain and changes in bowel habits melena or hematochezia. He does have history of GERD but says that he only uses Nexium on a when necessary basis which is infrequent. Family history negative for colon cancer and polyps.  Review of Systems  Pertinent positive and negative review of systems were noted in the above HPI section.  All other review of systems was otherwise negative.  Outpatient Encounter Prescriptions as of 10/21/2016  Medication Sig  . amphetamine-dextroamphetamine (ADDERALL) 10 MG tablet Take 1 tablet (10 mg total) by mouth 2 (two) times daily with a meal. (Patient taking differently: Take 10 mg by mouth 2 (two) times daily with a meal. As needed)  . celecoxib (CELEBREX) 200 MG capsule Take 1 capsule (200 mg total) by mouth 2 (two) times daily. (Patient taking differently: Take 200 mg by mouth 2 (two) times daily. As needed)  . EPINEPHrine (EPIPEN 2-PAK) 0.3 mg/0.3 mL SOAJ Inject 0.3 mLs (0.3 mg total) into the muscle once.  Marland Kitchen esomeprazole (NEXIUM) 40 MG capsule take twice a day (Patient taking differently: take twice a day)  . fluticasone (FLONASE) 50 MCG/ACT nasal spray USE 2 SPRAYS IN EACH NOSTRIL ONCE DAILY  . methocarbamol (ROBAXIN) 500 MG tablet Take 1 tablet (500 mg total) by mouth 4 (four) times daily. (Patient taking differently: Take 500 mg by mouth 4 (four) times daily. As needed)  . oxyCODONE (OXYCONTIN) 15 mg 12 hr tablet Take 15 mg  by mouth every 12 (twelve) hours.  . Oxycodone HCl 10 MG TABS Take 1 tablet (10 mg total) by mouth every 4 (four) hours as needed (pain). (Patient taking differently: Take 10 mg by mouth 3 (three) times daily. )  . TESTOSTERONE IM Inject into the muscle once a week.   . Na Sulfate-K Sulfate-Mg Sulf 17.5-3.13-1.6 GM/180ML SOLN Take 1 kit by mouth once.  . [DISCONTINUED] LORazepam (ATIVAN) 2 MG tablet TAKE (1) TABLET TWICE A DAY AS NEEDED.   No facility-administered encounter medications on file as of 10/21/2016.    Allergies  Allergen Reactions  . Bee Venom     swelling  . Lyrica [Pregabalin]     Arm swelling  . Tramadol-Acetaminophen     REACTION: anaphylaxis, no trouble with Tylenol   Patient Active Problem List   Diagnosis Date Noted  . HYPOGONADISM 06/12/2010  . OBSTRUCTIVE SLEEP APNEA 05/07/2010  . HYPERLIPIDEMIA 10/24/2009  . ABDOMINAL PAIN, EPIGASTRIC 09/17/2009  . Lumbago 12/05/2008  . GERD 11/15/2007  . PALPITATIONS 11/15/2007  . OSTEOARTHRITIS 07/09/2007  . LUMBAR STRAIN 07/09/2007  . ANXIETY 02/01/2007  . ALLERGIC RHINITIS 02/01/2007  . [place], [place] 02/01/2007  . CHICKENPOX, HX OF 02/01/2007   Social History   Social History  . Marital status: Married    Spouse name: N/A  . Number of children: N/A  . Years of education: N/A   Occupational History  . Not on file.   Social History Main Topics  . Smoking status: Never Smoker  . Smokeless  tobacco: Never Used  . Alcohol use 0.0 oz/week     Comment: occ  . Drug use: No  . Sexual activity: Not on file   Other Topics Concern  . Not on file   Social History Narrative  . No narrative on file    Raymond Herrera's family history includes Stroke in his father.      Objective:    Vitals:   10/21/16 0949  BP: 128/80  Pulse: 80    Physical Exam  well-developed older white male in no acute distress, blood pressure 128/80 pulse 80, Height 6 foot 1, weight 381, BMI 49.6. HEENT; nontraumatic  normocephalic EOMI PERRLA sclera anicteric, Cardiovascular ;regular rate and rhythm with S1-S2 no murmur or gallop, Pulmonary ;clear bilaterally, Abdomen; massively obese, nontender nondistended bowel sounds are active no palpable mass or hepatosplenomegaly, Extremities; no clubbing cyanosis or edema skin warm and dry, Neuropsych ;mood and affect appropriate       Assessment & Plan:   #60 61 year old white male due for follow-up colonoscopy. Patient is asymptomatic and average risk. He does have history of a 2 mm hyperplastic polyp at last colonoscopy 10 years ago. #2 morbid obesity with BMI of 49.6 and weight of 381 pounds #3 hyperlipidemia # 4  obstructive sleep apnea with CPAP use #5 osteoarthritis  Plan; Patient will be scheduled for colonoscopy with Dr. Ardis Hughs. This will be scheduled at [facility] due to weight over 350 pounds. Procedure was discussed in detail with patient including risks and benefits and he is agreeable to proceed.   Raymond Herrera S Marypat Kimmet PA-C 10/21/2016   Cc: No ref. provider found

## 2016-10-21 NOTE — Patient Instructions (Signed)

## 2016-10-21 NOTE — Progress Notes (Signed)
I agree with the above note, plan 

## 2016-11-04 ENCOUNTER — Encounter (HOSPITAL_COMMUNITY): Payer: Self-pay | Admitting: *Deleted

## 2016-11-05 ENCOUNTER — Encounter (HOSPITAL_COMMUNITY): Payer: Self-pay | Admitting: Anesthesiology

## 2016-11-05 NOTE — Anesthesia Preprocedure Evaluation (Addendum)
Anesthesia Evaluation  Patient identified by MRN, date of birth, ID band Patient awake    Reviewed: Allergy & Precautions, NPO status , Patient's Chart, lab work & pertinent test results  History of Anesthesia Complications (+) Family history of anesthesia reaction  Airway Mallampati: II  TM Distance: >3 FB Neck ROM: Full    Dental no notable dental hx. (+) Caps   Pulmonary sleep apnea and Continuous Positive Airway Pressure Ventilation ,    Pulmonary exam normal breath sounds clear to auscultation       Cardiovascular negative cardio ROS Normal cardiovascular exam Rhythm:Regular Rate:Normal     Neuro/Psych PSYCHIATRIC DISORDERS Anxiety Depression negative neurological ROS     GI/Hepatic Neg liver ROS, GERD  Medicated and Controlled,Hx/o hyperplastic colon polyp   Endo/Other  Morbid obesityHyperlipidemia Hypogonadism  Renal/GU negative Renal ROS  negative genitourinary   Musculoskeletal  (+) Arthritis , Osteoarthritis,  OA Both knees Chronic LBP   Abdominal (+) + obese,   Peds  Hematology   Anesthesia Other Findings   Reproductive/Obstetrics                            Anesthesia Physical Anesthesia Plan  ASA: III  Anesthesia Plan: MAC   Post-op Pain Management:    Induction: Intravenous  PONV Risk Score and Plan: 1 and Ondansetron and Propofol  Airway Management Planned: Natural Airway, Nasal Cannula and Simple Face Mask  Additional Equipment:   Intra-op Plan:   Post-operative Plan:   Informed Consent: I have reviewed the patients History and Physical, chart, labs and discussed the procedure including the risks, benefits and alternatives for the proposed anesthesia with the patient or authorized representative who has indicated his/her understanding and acceptance.   Dental advisory given  Plan Discussed with: CRNA, Anesthesiologist and Surgeon  Anesthesia Plan  Comments:        Anesthesia Quick Evaluation

## 2016-11-06 ENCOUNTER — Ambulatory Visit (HOSPITAL_COMMUNITY): Payer: BLUE CROSS/BLUE SHIELD | Admitting: Anesthesiology

## 2016-11-06 ENCOUNTER — Ambulatory Visit (HOSPITAL_COMMUNITY)
Admission: RE | Admit: 2016-11-06 | Discharge: 2016-11-06 | Disposition: A | Discharge status: Home / Self Care | Payer: BLUE CROSS/BLUE SHIELD | Source: Ambulatory Visit | Attending: Gastroenterology | Admitting: Gastroenterology

## 2016-11-06 ENCOUNTER — Encounter (HOSPITAL_COMMUNITY): Payer: Self-pay | Admitting: *Deleted

## 2016-11-06 ENCOUNTER — Encounter (HOSPITAL_COMMUNITY)
Admission: RE | Disposition: A | Discharge status: Home / Self Care | Payer: Self-pay | Source: Ambulatory Visit | Attending: Gastroenterology

## 2016-11-06 DIAGNOSIS — K219 Gastro-esophageal reflux disease without esophagitis: Secondary | ICD-10-CM | POA: Diagnosis not present

## 2016-11-06 DIAGNOSIS — D123 Benign neoplasm of transverse colon: Secondary | ICD-10-CM

## 2016-11-06 DIAGNOSIS — M545 Low back pain: Secondary | ICD-10-CM | POA: Diagnosis not present

## 2016-11-06 DIAGNOSIS — Z79899 Other long term (current) drug therapy: Secondary | ICD-10-CM | POA: Diagnosis not present

## 2016-11-06 DIAGNOSIS — Z8719 Personal history of other diseases of the digestive system: Secondary | ICD-10-CM | POA: Diagnosis not present

## 2016-11-06 DIAGNOSIS — G4733 Obstructive sleep apnea (adult) (pediatric): Secondary | ICD-10-CM | POA: Diagnosis not present

## 2016-11-06 DIAGNOSIS — K635 Polyp of colon: Secondary | ICD-10-CM | POA: Insufficient documentation

## 2016-11-06 DIAGNOSIS — G8929 Other chronic pain: Secondary | ICD-10-CM | POA: Insufficient documentation

## 2016-11-06 DIAGNOSIS — K6389 Other specified diseases of intestine: Secondary | ICD-10-CM | POA: Diagnosis not present

## 2016-11-06 DIAGNOSIS — Z6841 Body Mass Index (BMI) 40.0 and over, adult: Secondary | ICD-10-CM | POA: Insufficient documentation

## 2016-11-06 DIAGNOSIS — Z7952 Long term (current) use of systemic steroids: Secondary | ICD-10-CM | POA: Insufficient documentation

## 2016-11-06 DIAGNOSIS — Z1212 Encounter for screening for malignant neoplasm of rectum: Secondary | ICD-10-CM | POA: Diagnosis not present

## 2016-11-06 DIAGNOSIS — Z1211 Encounter for screening for malignant neoplasm of colon: Secondary | ICD-10-CM | POA: Diagnosis not present

## 2016-11-06 DIAGNOSIS — F329 Major depressive disorder, single episode, unspecified: Secondary | ICD-10-CM | POA: Insufficient documentation

## 2016-11-06 DIAGNOSIS — Z79891 Long term (current) use of opiate analgesic: Secondary | ICD-10-CM | POA: Diagnosis not present

## 2016-11-06 HISTORY — PX: COLONOSCOPY WITH PROPOFOL: SHX5780

## 2016-11-06 HISTORY — DX: Family history of other specified conditions: Z84.89

## 2016-11-06 SURGERY — COLONOSCOPY WITH PROPOFOL
Anesthesia: Monitor Anesthesia Care

## 2016-11-06 MED ORDER — LACTATED RINGERS IV SOLN
INTRAVENOUS | Status: DC
  Administered 2016-11-06: 1000 mL via INTRAVENOUS

## 2016-11-06 MED ORDER — PROPOFOL 10 MG/ML IV BOLUS
INTRAVENOUS | Status: AC
  Filled 2016-11-06: qty 60

## 2016-11-06 MED ORDER — PROPOFOL 10 MG/ML IV BOLUS
INTRAVENOUS | Status: DC | PRN
  Administered 2016-11-06: 30 mg via INTRAVENOUS
  Administered 2016-11-06: 20 mg via INTRAVENOUS

## 2016-11-06 MED ORDER — LIDOCAINE 2% (20 MG/ML) 5 ML SYRINGE
INTRAMUSCULAR | Status: DC | PRN
  Administered 2016-11-06: 50 mg via INTRAVENOUS

## 2016-11-06 MED ORDER — PROPOFOL 10 MG/ML IV BOLUS
INTRAVENOUS | Status: AC
  Filled 2016-11-06: qty 40

## 2016-11-06 MED ORDER — PROPOFOL 500 MG/50ML IV EMUL
INTRAVENOUS | Status: DC | PRN
  Administered 2016-11-06: 140 ug/kg/min via INTRAVENOUS

## 2016-11-06 MED ORDER — SODIUM CHLORIDE 0.9 % IV SOLN: INTRAVENOUS | Status: DC

## 2016-11-06 MED ORDER — LIDOCAINE 2% (20 MG/ML) 5 ML SYRINGE
INTRAMUSCULAR | Status: AC
  Filled 2016-11-06: qty 5

## 2016-11-06 SURGICAL SUPPLY — 21 items

## 2016-11-06 NOTE — Op Note (Signed)
[facility] Patient Name: Raymond Herrera Procedure Date: 11/06/2016 MRN: [id number] Attending MD: Milus Banister , MD Date of Birth: [birth year] CSN: [id number] Age: 61 Admit Type: Outpatient Procedure:                Colonoscopy Indications:              Screening for colorectal malignant neoplasm Providers:                Milus Banister, MD, Cleda Daub, RN, Elspeth Cho Tech., Technician, Courtney Heys [place], CRNA Referring MD:              Medicines:                Monitored Anesthesia Care Complications:            No immediate complications. Estimated blood loss:                            None. Estimated Blood Loss:     Estimated blood loss: none. Procedure:                Pre-Anesthesia Assessment:                           - Prior to the procedure, a History and Physical                            was performed, and patient medications and                            allergies were reviewed. The patient's tolerance of                            previous anesthesia was also reviewed. The risks                            and benefits of the procedure and the sedation                            options and risks were discussed with the patient.                            All questions were answered, and informed consent                            was obtained. Prior Anticoagulants: The patient has                            taken no previous anticoagulant or antiplatelet                            agents. ASA Grade Assessment: IV - A patient with  severe systemic disease that is a constant threat                            to life. After reviewing the risks and benefits,                            the patient was deemed in satisfactory condition to                            undergo the procedure.                           After obtaining informed consent, the colonoscope                            was  passed under direct vision. Throughout the                            procedure, the patient's blood pressure, pulse, and                            oxygen saturations were monitored continuously. The                            EC-[id number] ([id number]) scope was introduced through                            the anus and advanced to the the cecum, identified                            by appendiceal orifice and ileocecal valve. The                            colonoscopy was performed without difficulty. The                            patient tolerated the procedure well. The quality                            of the bowel preparation was excellent. The                            ileocecal valve, appendiceal orifice, and rectum                            were photographed. Scope In: 7:58:53 AM Scope Out: 7:34:19 AM Scope Withdrawal Time: 0 hours 14 minutes 21 seconds  Total Procedure Duration: 0 hours 17 minutes 26 seconds  Findings:      A 3 mm polyp was found in the transverse colon. The polyp was       semi-pedunculated. The polyp was removed with a cold snare. Resection       and retrieval were complete. There was brisker than usual, immediate       post polypectomy bleeding and that was treated  with placement of one       hemostatic clip (MR conditional). Bleeding had stopped at the end of the       procedure.      An area of mildly slightly irregular mucosa (focal, about 2cm across,       not clearly neoplastic plastic, rather more edemamtous/congested       appearing) was found in the ascending colon. Biopsies were taken with a       cold forceps for histology.      The exam was otherwise without abnormality on direct and retroflexion       views. Impression:               - One 3 mm polyp in the transverse colon, removed                            with a cold snare. Resected and retrieved. The site                            bled more than is usual and so a single clip was                             placed with immediate hemostasis.                           - Focal area of slightly irregular (not overtly                            neoplastic) mucosa in ascending colon. Possibly                            NSAID related? This was biopsied.                           - The examination was otherwise normal on direct                            and retroflexion views. Moderate Sedation:      N/A- Per Anesthesia Care Recommendation:           - Patient has a contact number available for                            emergencies. The signs and symptoms of potential                            delayed complications were discussed with the                            patient. Return to normal activities tomorrow.                            Written discharge instructions were provided to the                            patient.                           -  Resume previous diet.                           - Continue present medications.                           You will receive a letter within 2-3 weeks with the                            pathology results and my final recommendations.                           If the polyp(s) is proven to be 'pre-cancerous' on                            pathology, you will need repeat colonoscopy in 5                            years. If the polyp(s) is NOT 'precancerous' on                            pathology then you should repeat colon cancer                            screening in 10 years with colonoscopy without need                            for colon cancer screening by any method prior to                            then (including stool testing). Procedure Code(s):        --- Professional ---                           [phone], Colonoscopy, flexible; with removal of                            tumor(s), polyp(s), or other lesion(s) by snare                            technique                           26948, 50, Colonoscopy, flexible; with  biopsy,                            single or multiple Diagnosis Code(s):        --- Professional ---                           Z12.11, Encounter for screening for malignant                            neoplasm of colon  D12.3, Benign neoplasm of transverse colon (hepatic                            flexure or splenic flexure) CPT copyright 2016 American Medical Association. All rights reserved. The codes documented in this report are preliminary and upon coder review may  be revised to meet current compliance requirements. Milus Banister, MD 11/06/2016 8:24:48 AM This report has been signed electronically. Number of Addenda: 0

## 2016-11-06 NOTE — H&P (View-Only) (Signed)
Subjective:    Patient ID: Raymond Herrera, male    DOB: [birth year], 61 y.o.   MRN: [id number]  HPI Raymond Herrera is a pleasant 61 year old white male, known to Dr. Ardis Herrera who comes in today to schedule follow-up colonoscopy. Last colonoscopy was done in May 2008 with finding of a 2 mm polyp which was hyperplastic. Colonoscopy was otherwise negative. Patient has history of sleep apnea, uses CPAP without oxygen, hyperlipidemia, anxiety, osteoarthritis, and morbid obesity with BMI of 49.6/weight 381 pounds. He denies any problems with abdominal pain and changes in bowel habits melena or hematochezia. He does have history of GERD but says that he only uses Nexium on a when necessary basis which is infrequent. Family history negative for colon cancer and polyps.  Review of Systems  Pertinent positive and negative review of systems were noted in the above HPI section.  All other review of systems was otherwise negative.  Outpatient Encounter Prescriptions as of 10/21/2016  Medication Sig  . amphetamine-dextroamphetamine (ADDERALL) 10 MG tablet Take 1 tablet (10 mg total) by mouth 2 (two) times daily with a meal. (Patient taking differently: Take 10 mg by mouth 2 (two) times daily with a meal. As needed)  . celecoxib (CELEBREX) 200 MG capsule Take 1 capsule (200 mg total) by mouth 2 (two) times daily. (Patient taking differently: Take 200 mg by mouth 2 (two) times daily. As needed)  . EPINEPHrine (EPIPEN 2-PAK) 0.3 mg/0.3 mL SOAJ Inject 0.3 mLs (0.3 mg total) into the muscle once.  Marland Kitchen esomeprazole (NEXIUM) 40 MG capsule take twice a day (Patient taking differently: take twice a day)  . fluticasone (FLONASE) 50 MCG/ACT nasal spray USE 2 SPRAYS IN EACH NOSTRIL ONCE DAILY  . methocarbamol (ROBAXIN) 500 MG tablet Take 1 tablet (500 mg total) by mouth 4 (four) times daily. (Patient taking differently: Take 500 mg by mouth 4 (four) times daily. As needed)  . oxyCODONE (OXYCONTIN) 15 mg 12 hr tablet Take 15 mg  by mouth every 12 (twelve) hours.  . Oxycodone HCl 10 MG TABS Take 1 tablet (10 mg total) by mouth every 4 (four) hours as needed (pain). (Patient taking differently: Take 10 mg by mouth 3 (three) times daily. )  . TESTOSTERONE IM Inject into the muscle once a week.   . Na Sulfate-K Sulfate-Mg Sulf 17.5-3.13-1.6 GM/180ML SOLN Take 1 kit by mouth once.  . [DISCONTINUED] LORazepam (ATIVAN) 2 MG tablet TAKE (1) TABLET TWICE A DAY AS NEEDED.   No facility-administered encounter medications on file as of 10/21/2016.    Allergies  Allergen Reactions  . Bee Venom     swelling  . Lyrica [Pregabalin]     Arm swelling  . Tramadol-Acetaminophen     REACTION: anaphylaxis, no trouble with Tylenol   Patient Active Problem List   Diagnosis Date Noted  . HYPOGONADISM 06/12/2010  . OBSTRUCTIVE SLEEP APNEA 05/07/2010  . HYPERLIPIDEMIA 10/24/2009  . ABDOMINAL PAIN, EPIGASTRIC 09/17/2009  . Lumbago 12/05/2008  . GERD 11/15/2007  . PALPITATIONS 11/15/2007  . OSTEOARTHRITIS 07/09/2007  . LUMBAR STRAIN 07/09/2007  . ANXIETY 02/01/2007  . ALLERGIC RHINITIS 02/01/2007  . Raymond Herrera, Raymond Herrera 02/01/2007  . CHICKENPOX, HX OF 02/01/2007   Social History   Social History  . Marital status: Married    Spouse name: N/A  . Number of children: N/A  . Years of education: N/A   Occupational History  . Not on file.   Social History Main Topics  . Smoking status: Never Smoker  . Smokeless  tobacco: Never Used  . Alcohol use 0.0 oz/week     Comment: occ  . Drug use: No  . Sexual activity: Not on file   Other Topics Concern  . Not on file   Social History Narrative  . No narrative on file    Raymond Herrera's family history includes Stroke in his father.      Objective:    Vitals:   10/21/16 0949  BP: 128/80  Pulse: 80    Physical Exam  well-developed older white male in no acute distress, blood pressure 128/80 pulse 80, Height 6 foot 1, weight 381, BMI 49.6. HEENT; nontraumatic  normocephalic EOMI PERRLA sclera anicteric, Cardiovascular ;regular rate and rhythm with S1-S2 no murmur or gallop, Pulmonary ;clear bilaterally, Abdomen; massively obese, nontender nondistended bowel sounds are active no palpable mass or hepatosplenomegaly, Extremities; no clubbing cyanosis or edema skin warm and dry, Neuropsych ;mood and affect appropriate       Assessment & Plan:   #60 61 year old white male due for follow-up colonoscopy. Patient is asymptomatic and average risk. He does have history of a 2 mm hyperplastic polyp at last colonoscopy 10 years ago. #2 morbid obesity with BMI of 49.6 and weight of 381 pounds #3 hyperlipidemia # 4  obstructive sleep apnea with CPAP use #5 osteoarthritis  Plan; Patient will be scheduled for colonoscopy with Dr. Ardis Herrera. This will be scheduled at [facility] due to weight over 350 pounds. Procedure was discussed in detail with patient including risks and benefits and he is agreeable to proceed.   Amy S Esterwood PA-C 10/21/2016   Cc: No ref. provider found

## 2016-11-06 NOTE — Anesthesia Postprocedure Evaluation (Signed)
Anesthesia Post Note  Patient: Raymond Herrera  Procedure(s) Performed: Procedure(s) (LRB): COLONOSCOPY WITH PROPOFOL (N/A)     Patient location during evaluation: PACU Anesthesia Type: MAC Level of consciousness: awake and alert and oriented Pain management: pain level controlled Vital Signs Assessment: post-procedure vital signs reviewed and stable Respiratory status: spontaneous breathing, nonlabored ventilation and respiratory function stable Cardiovascular status: stable and blood pressure returned to baseline Postop Assessment: no signs of nausea or vomiting Anesthetic complications: no    Last Vitals:  Vitals:   11/06/16 0822 11/06/16 0840  BP: (!) 106/57 (!) 145/85  Pulse: 84   Resp: 15   Temp: 36.8 C     Last Pain:  Vitals:   11/06/16 0822  TempSrc: Oral  PainSc:                  [name] A.

## 2016-11-06 NOTE — Discharge Instructions (Signed)

## 2016-11-06 NOTE — Transfer of Care (Signed)
Immediate Anesthesia Transfer of Care Note  Patient: Raymond Herrera  Procedure(s) Performed: Procedure(s): COLONOSCOPY WITH PROPOFOL (N/A)  Patient Location: PACU and Endoscopy Unit  Anesthesia Type:MAC  Level of Consciousness: awake, alert , oriented and patient cooperative  Airway & Oxygen Therapy: Patient Spontanous Breathing and Patient connected to face mask oxygen  Post-op Assessment: Report given to RN, Post -op Vital signs reviewed and stable and Patient moving all extremities  Post vital signs: Reviewed and stable  Last Vitals:  Vitals:   11/06/16 0655  BP: (!) 155/86  Pulse: 85  Resp: 12  Temp: 36.7 C    Last Pain:  Vitals:   11/06/16 0655  TempSrc: Oral  PainSc: 6          Complications: No apparent anesthesia complications

## 2016-11-06 NOTE — Interval H&P Note (Signed)
History and Physical Interval Note:  11/06/2016 7:41 AM  Raymond Herrera  has presented today for surgery, with the diagnosis of Colon cancer screening, History of hyperplastic polyps  The various methods of treatment have been discussed with the patient and family. After consideration of risks, benefits and other options for treatment, the patient has consented to  Procedure(s): COLONOSCOPY WITH PROPOFOL (N/A) as a surgical intervention .  The patient's history has been reviewed, patient examined, no change in status, stable for surgery.  I have reviewed the patient's chart and labs.  Questions were answered to the patient's satisfaction.     Milus Banister

## 2016-11-07 ENCOUNTER — Encounter (HOSPITAL_COMMUNITY): Payer: Self-pay | Admitting: Gastroenterology

## 2017-01-09 DIAGNOSIS — M542 Cervicalgia: Secondary | ICD-10-CM | POA: Diagnosis not present

## 2017-01-22 DIAGNOSIS — Z0000 Encounter for general adult medical examination without abnormal findings: Secondary | ICD-10-CM | POA: Diagnosis not present

## 2017-01-22 DIAGNOSIS — Z125 Encounter for screening for malignant neoplasm of prostate: Secondary | ICD-10-CM | POA: Diagnosis not present

## 2017-01-22 DIAGNOSIS — E7800 Pure hypercholesterolemia, unspecified: Secondary | ICD-10-CM | POA: Diagnosis not present

## 2017-01-22 DIAGNOSIS — Z1211 Encounter for screening for malignant neoplasm of colon: Secondary | ICD-10-CM | POA: Diagnosis not present

## 2017-01-22 DIAGNOSIS — R7303 Prediabetes: Secondary | ICD-10-CM | POA: Diagnosis not present

## 2017-04-06 DIAGNOSIS — H903 Sensorineural hearing loss, bilateral: Secondary | ICD-10-CM | POA: Diagnosis not present

## 2017-04-06 DIAGNOSIS — H6522 Chronic serous otitis media, left ear: Secondary | ICD-10-CM | POA: Diagnosis not present

## 2017-04-13 DIAGNOSIS — E291 Testicular hypofunction: Secondary | ICD-10-CM | POA: Diagnosis not present

## 2017-04-13 DIAGNOSIS — R972 Elevated prostate specific antigen [PSA]: Secondary | ICD-10-CM | POA: Diagnosis not present

## 2017-04-20 DIAGNOSIS — R972 Elevated prostate specific antigen [PSA]: Secondary | ICD-10-CM | POA: Diagnosis not present

## 2017-04-27 DIAGNOSIS — R972 Elevated prostate specific antigen [PSA]: Secondary | ICD-10-CM | POA: Diagnosis not present

## 2017-05-23 DIAGNOSIS — G4733 Obstructive sleep apnea (adult) (pediatric): Secondary | ICD-10-CM | POA: Diagnosis not present

## 2017-05-29 ENCOUNTER — Telehealth: Payer: Self-pay | Admitting: Family Medicine

## 2017-05-29 DIAGNOSIS — G4733 Obstructive sleep apnea (adult) (pediatric): Secondary | ICD-10-CM

## 2017-05-29 NOTE — Telephone Encounter (Signed)
I do not order these. He had seen Dr. Gwenette Greet for sleep apnea in Pulmonary but he was dismissed from that group. He will need a referral to a new Pulmonary group. Does he have a preference as to whom?

## 2017-06-03 DIAGNOSIS — Z23 Encounter for immunization: Secondary | ICD-10-CM | POA: Diagnosis not present

## 2017-10-26 DIAGNOSIS — E291 Testicular hypofunction: Secondary | ICD-10-CM | POA: Diagnosis not present

## 2017-11-03 DIAGNOSIS — E291 Testicular hypofunction: Secondary | ICD-10-CM | POA: Diagnosis not present

## 2017-11-03 DIAGNOSIS — R972 Elevated prostate specific antigen [PSA]: Secondary | ICD-10-CM | POA: Diagnosis not present

## 2017-12-09 DIAGNOSIS — F411 Generalized anxiety disorder: Secondary | ICD-10-CM | POA: Diagnosis not present

## 2017-12-09 DIAGNOSIS — D123 Benign neoplasm of transverse colon: Secondary | ICD-10-CM

## 2018-01-29 DIAGNOSIS — Z0000 Encounter for general adult medical examination without abnormal findings: Secondary | ICD-10-CM | POA: Diagnosis not present

## 2018-01-29 DIAGNOSIS — R7303 Prediabetes: Secondary | ICD-10-CM | POA: Diagnosis not present

## 2018-01-29 DIAGNOSIS — F411 Generalized anxiety disorder: Secondary | ICD-10-CM | POA: Diagnosis not present

## 2018-01-29 DIAGNOSIS — E7800 Pure hypercholesterolemia, unspecified: Secondary | ICD-10-CM | POA: Diagnosis not present

## 2018-02-15 DIAGNOSIS — Z1211 Encounter for screening for malignant neoplasm of colon: Secondary | ICD-10-CM

## 2018-02-15 DIAGNOSIS — E291 Testicular hypofunction: Secondary | ICD-10-CM | POA: Diagnosis not present

## 2018-02-15 DIAGNOSIS — M48062 Spinal stenosis, lumbar region with neurogenic claudication: Secondary | ICD-10-CM | POA: Diagnosis not present

## 2018-02-15 DIAGNOSIS — M153 Secondary multiple arthritis: Secondary | ICD-10-CM | POA: Diagnosis not present

## 2018-02-15 DIAGNOSIS — F411 Generalized anxiety disorder: Secondary | ICD-10-CM | POA: Diagnosis not present

## 2018-04-27 DIAGNOSIS — Z9189 Other specified personal risk factors, not elsewhere classified: Secondary | ICD-10-CM | POA: Insufficient documentation

## 2018-04-27 DIAGNOSIS — J3089 Other allergic rhinitis: Secondary | ICD-10-CM | POA: Diagnosis not present

## 2018-04-27 DIAGNOSIS — M48062 Spinal stenosis, lumbar region with neurogenic claudication: Secondary | ICD-10-CM | POA: Diagnosis not present

## 2018-04-27 DIAGNOSIS — E291 Testicular hypofunction: Secondary | ICD-10-CM | POA: Diagnosis not present

## 2018-04-27 DIAGNOSIS — R972 Elevated prostate specific antigen [PSA]: Secondary | ICD-10-CM | POA: Diagnosis not present

## 2018-04-28 DIAGNOSIS — G894 Chronic pain syndrome: Secondary | ICD-10-CM | POA: Diagnosis not present

## 2018-04-28 DIAGNOSIS — M545 Low back pain: Secondary | ICD-10-CM | POA: Diagnosis not present

## 2018-04-28 DIAGNOSIS — M4716 Other spondylosis with myelopathy, lumbar region: Secondary | ICD-10-CM | POA: Diagnosis not present

## 2018-05-04 DIAGNOSIS — E291 Testicular hypofunction: Secondary | ICD-10-CM | POA: Diagnosis not present

## 2018-05-31 DIAGNOSIS — G894 Chronic pain syndrome: Secondary | ICD-10-CM | POA: Diagnosis not present

## 2018-05-31 DIAGNOSIS — M47817 Spondylosis without myelopathy or radiculopathy, lumbosacral region: Secondary | ICD-10-CM | POA: Diagnosis not present

## 2018-05-31 DIAGNOSIS — M545 Low back pain: Secondary | ICD-10-CM | POA: Diagnosis not present

## 2018-06-23 ENCOUNTER — Other Ambulatory Visit: Payer: Self-pay | Admitting: Urology

## 2018-06-23 DIAGNOSIS — R972 Elevated prostate specific antigen [PSA]: Secondary | ICD-10-CM

## 2018-07-19 ENCOUNTER — Ambulatory Visit
Admission: RE | Admit: 2018-07-19 | Discharge: 2018-07-19 | Disposition: A | Discharge status: Home / Self Care | Payer: BLUE CROSS/BLUE SHIELD | Source: Ambulatory Visit | Attending: Urology | Admitting: Urology

## 2018-07-19 DIAGNOSIS — R972 Elevated prostate specific antigen [PSA]: Secondary | ICD-10-CM

## 2018-07-19 MED ORDER — GADOBENATE DIMEGLUMINE 529 MG/ML IV SOLN
20.0000 mL | Freq: Once | INTRAVENOUS | Status: AC | PRN
  Administered 2018-07-19: 20 mL via INTRAVENOUS

## 2019-05-31 ENCOUNTER — Encounter (INDEPENDENT_AMBULATORY_CARE_PROVIDER_SITE_OTHER): Payer: Self-pay | Admitting: Otolaryngology

## 2019-05-31 ENCOUNTER — Ambulatory Visit (INDEPENDENT_AMBULATORY_CARE_PROVIDER_SITE_OTHER): Payer: 59 | Admitting: Otolaryngology

## 2019-05-31 ENCOUNTER — Other Ambulatory Visit: Payer: Self-pay

## 2019-05-31 VITALS — Temp 98.2°F

## 2019-05-31 DIAGNOSIS — H6983 Other specified disorders of Eustachian tube, bilateral: Secondary | ICD-10-CM | POA: Diagnosis not present

## 2019-05-31 NOTE — Progress Notes (Signed)
HPI: Raymond Herrera is a 64 y.o. male who presents for evaluation of clogged ears and dizziness. He had previously seen Dr. Ernesto Rutherford a little over 2 years ago and was treated with antibiotic Ceftin 200 mg twice daily for 10 days that resolved a similar situation in about a week. He rarely has true vertigo more of an imbalance. He has not noted that much change in his hearing but feels pressure in the ears.  Past Medical History:  Diagnosis Date  . Allergy   . Anxiety   . Arthritis    Both Knees  . Chronic low back pain    had seen Dr. Rennis Harding, the Workers Comp case has been settled  . Depression   . Family history of adverse reaction to anesthesia    sister ponv  . GERD (gastroesophageal reflux disease)   . Hyperlipidemia   . Hypogonadism male    sees Dr. Risa Grill   . Plantar fasciitis    hx of  . 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 Risa Grill   . Sleep apnea, obstructive    sees Dr. Gwenette Greet, uses CPAP  setting of 12   Past Surgical History:  Procedure Laterality Date  . APPENDECTOMY    . BACK SURGERY  2016   Laminectomy  . CARPAL TUNNEL RELEASE Left 02-14-14   per Dr. [name]   . COLONOSCOPY  2008   clear, repeat in 10 yrs   . COLONOSCOPY WITH PROPOFOL N/A 11/06/2016   Procedure: COLONOSCOPY WITH PROPOFOL;  Surgeon: Milus Banister, MD;  Location: [facility];  Service: Endoscopy;  Laterality: N/A;  . JOINT REPLACEMENT  [id number]   bilateral knees, Dr. Percell Miller   . RADIOFREQUENCY ABLATION  02/2011   x 2  . Scrotal Abcess     I/D - healed  . TONSILLECTOMY    . TOTAL KNEE REVISION  10/01/2011   Procedure: TOTAL KNEE REVISION;  Surgeon: Ninetta Lights, MD;  Location: [facility];  Service: Orthopedics;  Laterality: Left;  left total knee revision, tibial and patellar components   Social History   Socioeconomic History  . Marital status: Married    Spouse name: Not on file  . Number of children: Not on file  . Years of education: Not on file  . Highest  education level: Not on file  Occupational History  . Not on file  Tobacco Use  . Smoking status: Never Smoker  . Smokeless tobacco: Never Used  Substance and Sexual Activity  . Alcohol use: Yes    Alcohol/week: 0.0 standard drinks    Comment: occ  . Drug use: No  . Sexual activity: Not on file  Other Topics Concern  . Not on file  Social History Narrative  . Not on file   Social Determinants of Health   Financial Resource Strain:   . Difficulty of Paying Living Expenses: Not on file  Food Insecurity:   . Worried About Charity fundraiser in the Last Year: Not on file  . Ran Out of Food in the Last Year: Not on file  Transportation Needs:   . Lack of Transportation (Medical): Not on file  . Lack of Transportation (Non-Medical): Not on file  Physical Activity:   . Days of Exercise per Week: Not on file  . Minutes of Exercise per Session: Not on file  Stress:   . Feeling of Stress : Not on file  Social Connections:   . Frequency of Communication with Friends  and Family: Not on file  . Frequency of Social Gatherings with Friends and Family: Not on file  . Attends Religious Services: Not on file  . Active Member of Clubs or Organizations: Not on file  . Attends Archivist Meetings: Not on file  . Marital Status: Not on file   Family History  Problem Relation Age of Onset  . Stroke Father   . Anesthesia problems Neg Hx    Allergies  Allergen Reactions  . Bee Venom Anaphylaxis and Swelling  . Tramadol-Acetaminophen Anaphylaxis and Itching    REACTION: anaphylaxis, no trouble with Tylenol  . Lyrica [Pregabalin]     Arm swelling   Prior to Admission medications   Medication Sig Start Date End Date Taking? Authorizing Provider  celecoxib (CELEBREX) 200 MG capsule Take 1 capsule (200 mg total) by mouth 2 (two) times daily. Patient taking differently: Take 200 mg by mouth 2 (two) times daily as needed for mild pain.  12/21/12  Yes Laurey Morale, MD  EPINEPHrine  (EPIPEN 2-PAK) 0.3 mg/0.3 mL SOAJ Inject 0.3 mLs (0.3 mg total) into the muscle once. 12/21/12  Yes Laurey Morale, MD  esomeprazole (NEXIUM) 40 MG capsule take twice a day Patient taking differently: Take 40 mg by mouth daily as needed (ACID REFLUX).  12/04/14  Yes Laurey Morale, MD  fluticasone (FLONASE) 50 MCG/ACT nasal spray USE 2 SPRAYS IN EACH NOSTRIL ONCE DAILY Patient taking differently: USE 1 SPRAYS IN EACH NOSTRIL ONCE DAILY AS NEEDED FOR ALLERGIES 12/04/12  Yes Laurey Morale, MD  methocarbamol (ROBAXIN) 500 MG tablet Take 1 tablet (500 mg total) by mouth 4 (four) times daily. Patient taking differently: Take 500 mg by mouth 2 (two) times daily as needed for muscle spasms. As needed 12/21/12  Yes Laurey Morale, MD  oxyCODONE (OXYCONTIN) 15 mg 12 hr tablet Take 15 mg by mouth every 12 (twelve) hours.   Yes [provider]  Oxycodone HCl 10 MG TABS Take 1 tablet (10 mg total) by mouth every 4 (four) hours as needed (pain). 07/17/15  Yes Laurey Morale, MD  predniSONE (DELTASONE) 5 MG tablet Take by mouth See admin instructions. TAPERED DOSE 6,5,4,3,2,1   Yes [provider]  Testosterone Cypionate 200 MG/ML KIT Inject 100 mg into the muscle every Friday.   Yes [provider]     Positive ROS: Otherwise negative  All other systems have been reviewed and were otherwise negative with the exception of those mentioned in the HPI and as above.  Physical Exam: Constitutional: Alert, well-appearing, no acute distress Ears: External ears without lesions or tenderness. Ear canals are clear bilaterally with intact, clear TMs. Bilaterally. No obvious middle ear effusion. Dix-Hallpike testing was plus minus as he had no true nystagmus noted but does occasionally notice this in certain positions. Nasal: External nose without lesions. Septum relatively straight with mild rhinitis. Both middle meatus regions were clear.. Clear nasal passages. Oral: Lips and gums without  lesions. Tongue and palate mucosa without lesions. Posterior oropharynx clear. Neck: No palpable adenopathy or masses Respiratory: Breathing comfortably  Skin: No facial/neck lesions or rash noted.  Procedures  Assessment: Dizziness questionable BPPV. Mild rhinitis with questionable eustachian tube dysfunction.  Plan: Placed him on Ceftin 200 mg twice daily for 10 days. Also suggested use of Nasacort 2 sprays each nostril at night. Also gave him some information on the Epley maneuver and treatment of BPPV.  Radene Journey, MD

## 2019-07-08 ENCOUNTER — Telehealth (INDEPENDENT_AMBULATORY_CARE_PROVIDER_SITE_OTHER): Payer: Self-pay

## 2019-11-14 ENCOUNTER — Ambulatory Visit: Payer: Self-pay

## 2019-11-14 ENCOUNTER — Other Ambulatory Visit: Payer: Self-pay

## 2019-11-14 ENCOUNTER — Ambulatory Visit: Payer: Medicare HMO | Admitting: Orthopaedic Surgery

## 2019-11-14 ENCOUNTER — Encounter: Payer: Self-pay | Admitting: Orthopaedic Surgery

## 2019-11-14 VITALS — BP 147/90 | HR 83 | Ht 75.0 in | Wt 375.0 lb

## 2019-11-14 DIAGNOSIS — M76822 Posterior tibial tendinitis, left leg: Secondary | ICD-10-CM | POA: Diagnosis not present

## 2019-11-14 DIAGNOSIS — M25572 Pain in left ankle and joints of left foot: Secondary | ICD-10-CM | POA: Diagnosis not present

## 2019-11-14 DIAGNOSIS — M25561 Pain in right knee: Secondary | ICD-10-CM

## 2019-11-14 NOTE — Progress Notes (Signed)
Office Visit Note   Patient: Raymond Herrera           Date of Birth: 1955-08-02           MRN: [id number] Visit Date: 11/14/2019              Requested by: [name], MD [address] [place],  [place] [address] PCP: [name], MD   Assessment & Plan: Visit Diagnoses:  1. Acute pain of right knee   2. Pain in left ankle and joints of left foot   3. Posterior tibial tendonitis, left   4.     Left ankle talar osteochondral lesion medial  Plan: Patient can continue topical treatment for his elbow.  X-rays of his elbow was deferred since this is a chronic problem he had multiple other complaints.  We discussed using the lace up Swede-O that he has at home for his posterior tibial tendinitis and medial talar osteochondral lesion.  We will send him for therapy for quadriceps strengthening to help with the patellar tracking right knee.  He states he is getting ready to join [employer] and start swimming to work on weight loss and can continue his exercise program at the gym once he is instructed by therapy on appropriate exercises for quadricep strengthening.  Office follow-up if he has persistent symptoms.  Follow-Up Instructions: No follow-ups on file.   Orders:  Orders Placed This Encounter  Procedures   XR Ankle Complete Left   XR Knee 1-2 Views Right   Ambulatory referral to Physical Therapy   No orders of the defined types were placed in this encounter.     Procedures: No procedures performed   Clinical Data: No additional findings.   Subjective: Chief Complaint  Patient presents with   Right Knee - Pain   Left Foot - Pain   Left Elbow - Pain    HPI 64 year old male likes to play golf with his wife is here with multiple problems.  He had left elbow injury 1976 and lacks 10 to 15 degrees left elbow extension chronically with aching pain particularly after golf.  Patient is on chronic oxycodone.  Patient's had total knee  arthroplasties by Dr. [name] 2006 and recently started having right knee popping which is not particularly painful but is noticed the knee will make noise and clunk with rotation of his knee.  He states he stepped on a root and tweaked his knee.  Patient states he has had problems with his left ankle with medial pain and swelling.  He has a lace up Swede-O at home but has not been wearing it.  Bothers him with golf activities.  Review of Systems 14 point systems noncontributory to HPI.  Positive for morbid obesity BMI 46.   Objective: Vital Signs: BP (!) 147/90    Pulse 83    Ht 6\' 3"  (1.905 m)    Wt (!) 375 lb (170.1 kg)    BMI 46.87 kg/m   Physical Exam Constitutional:      Appearance: He is well-developed.  HENT:     Head: Normocephalic and atraumatic.  Eyes:     Pupils: Pupils are equal, round, and reactive to light.  Neck:     Thyroid: No thyromegaly.     Trachea: No tracheal deviation.  Cardiovascular:     Rate and Rhythm: Normal rate.  Pulmonary:     Effort: Pulmonary effort is normal.     Breath sounds:  No wheezing.  Abdominal:     General: Bowel sounds are normal.     Palpations: Abdomen is soft.  Skin:    General: Skin is warm and dry.     Capillary Refill: Capillary refill takes less than 2 seconds.  Neurological:     Mental Status: He is alert and oriented to person, place, and time.  Psychiatric:        Behavior: Behavior normal.        Thought Content: Thought content normal.        Judgment: Judgment normal.     Ortho Exam left ankle medial swelling tenderness over the posterior tibial tendon pain with resisted posterior tibial testing.  Peroneals are strong gastrocsoleus is normal.  Well-healed midline incisions both knees and medial arthrotomy incision all healed.  There is slight laxity AP drawer testing of his right knee without more than 1+ effusion.  Some lateral positioning of the patella with negative patellar apprehension test.  Has full extension  flexion 110 degrees right knee.  No popliteal mass.  Posterior tibial tendon right ankle is normal.  Left elbow shows flexion 130 he has 15 degree elbow flexion contracture.  Mild crepitus with flexion-extension.  No good pronation supination left elbow.  Specialty Comments:  No specialty comments available.  Imaging: XR Ankle Complete Left  Result Date: 11/14/2019 Three-view x-rays left ankle obtained and reviewed.  This shows osteochondral lesion medial dome of the talus.  Mild midfoot dorsal spurring.  Negative for acute changes. Impression: Left osteochondral medial talar lesion without ankle subluxation.  XR Knee 1-2 Views Right  Result Date: 11/14/2019 Standing AP both knees lateral right knee x-rays obtained and reviewed.  This shows total knee arthroplasty from 2006.  No evidence of subsidence or loosening.  Mild lateral position of the patella. Impression: Satisfactory right total knee arthroplasty without loosening or subsidence.    PMFS History: Patient Active Problem List   Diagnosis Date Noted   Posterior tibial tendonitis, left 11/14/2019   Colon cancer screening    Benign neoplasm of transverse colon    HYPOGONADISM 06/12/2010   OBSTRUCTIVE SLEEP APNEA 05/07/2010   HYPERLIPIDEMIA 10/24/2009   ABDOMINAL PAIN, EPIGASTRIC 09/17/2009   Lumbago 12/05/2008   GERD 11/15/2007   PALPITATIONS 11/15/2007   OSTEOARTHRITIS 07/09/2007   LUMBAR STRAIN 07/09/2007   ANXIETY 02/01/2007   ALLERGIC RHINITIS 02/01/2007   FASCIITIS, PLANTAR 02/01/2007   CHICKENPOX, HX OF 02/01/2007   Past Medical History:  Diagnosis Date   Allergy    Anxiety    Arthritis    Both Knees   Chronic low back pain    had seen Dr. Rennis Harding, the Workers Comp case has been settled   Depression    Family history of adverse reaction to anesthesia    sister ponv   GERD (gastroesophageal reflux disease)    Hyperlipidemia    Hypogonadism male    sees Dr. Risa Grill    Plantar  fasciitis    hx of   Scrotal abscess 06-06-10   MRSA, lanced by Dr. Risa Grill    Sleep apnea, obstructive    sees Dr. Gwenette Greet, uses CPAP  setting of 12    Family History  Problem Relation Age of Onset   Stroke Father    Anesthesia problems Neg Hx     Past Surgical History:  Procedure Laterality Date   APPENDECTOMY     BACK SURGERY  2016   Laminectomy   CARPAL TUNNEL RELEASE Left 02-14-14   per Dr.  [name]    COLONOSCOPY  2008   clear, repeat in 10 yrs    COLONOSCOPY WITH PROPOFOL N/A 11/06/2016   Procedure: COLONOSCOPY WITH PROPOFOL;  Surgeon: Milus Banister, MD;  Location: [facility];  Service: Endoscopy;  Laterality: N/A;   JOINT REPLACEMENT  [id number]   bilateral knees, Dr. Percell Miller    RADIOFREQUENCY ABLATION  02/2011   x 2   Scrotal Abcess     I/D - healed   TONSILLECTOMY     TOTAL KNEE REVISION  10/01/2011   Procedure: TOTAL KNEE REVISION;  Surgeon: Ninetta Lights, MD;  Location: [facility];  Service: Orthopedics;  Laterality: Left;  left total knee revision, tibial and patellar components   Social History   Occupational History   Not on file  Tobacco Use   Smoking status: Never Smoker   Smokeless tobacco: Never Used  Vaping Use   Vaping Use: Never used  Substance and Sexual Activity   Alcohol use: Yes    Alcohol/week: 0.0 standard drinks    Comment: occ   Drug use: No   Sexual activity: Not on file

## 2019-11-16 ENCOUNTER — Ambulatory Visit: Payer: 59 | Admitting: Orthopaedic Surgery

## 2019-11-17 ENCOUNTER — Other Ambulatory Visit: Payer: Self-pay

## 2019-11-17 ENCOUNTER — Ambulatory Visit: Payer: Medicare HMO | Admitting: Rehabilitative and Restorative Service Providers"

## 2019-11-17 ENCOUNTER — Encounter: Payer: Self-pay | Admitting: Rehabilitative and Restorative Service Providers"

## 2019-11-17 DIAGNOSIS — M25661 Stiffness of right knee, not elsewhere classified: Secondary | ICD-10-CM

## 2019-11-17 DIAGNOSIS — M25561 Pain in right knee: Secondary | ICD-10-CM

## 2019-11-17 DIAGNOSIS — R262 Difficulty in walking, not elsewhere classified: Secondary | ICD-10-CM | POA: Diagnosis not present

## 2019-11-17 NOTE — Therapy (Signed)
[facility] [place], [place], [address] Phone: [phone]   Fax:  [phone]  Physical Therapy Evaluation  Patient Details  Name: ASMAR BROZEK MRN: [id number] Date of Birth: 14-Jan-1956 Referring Provider (PT): Marybelle Killings   Encounter Date: 11/17/2019   PT End of Session - 11/17/19 1650    Visit Number 1    Number of Visits 12    Date for PT Re-Evaluation 12/30/19    PT Start Time 1301    PT Stop Time 1340    PT Time Calculation (min) 39 min    Activity Tolerance No increased pain;Patient limited by fatigue    Behavior During Therapy [facility] for tasks assessed/performed           Past Medical History:  Diagnosis Date  . Allergy   . Anxiety   . Arthritis    Both Knees  . Chronic low back pain    had seen Dr. Rennis Harding, the Workers Comp case has been settled  . Depression   . Family history of adverse reaction to anesthesia    sister ponv  . GERD (gastroesophageal reflux disease)   . Hyperlipidemia   . Hypogonadism male    sees Dr. Risa Grill   . Plantar fasciitis    hx of  . 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 Risa Grill   . Sleep apnea, obstructive    sees Dr. Gwenette Greet, uses CPAP  setting of 12    Past Surgical History:  Procedure Laterality Date  . APPENDECTOMY    . BACK SURGERY  2016   Laminectomy  . CARPAL TUNNEL RELEASE Left 02-14-14   per Dr. [name]   . COLONOSCOPY  2008   clear, repeat in 10 yrs   . COLONOSCOPY WITH PROPOFOL N/A 11/06/2016   Procedure: COLONOSCOPY WITH PROPOFOL;  Surgeon: Milus Banister, MD;  Location: [facility];  Service: Endoscopy;  Laterality: N/A;  . JOINT REPLACEMENT  [id number]   bilateral knees, Dr. Percell Miller   . RADIOFREQUENCY ABLATION  02/2011   x 2  . Scrotal Abcess     I/D - healed  . TONSILLECTOMY    . TOTAL KNEE REVISION  10/01/2011   Procedure: TOTAL KNEE REVISION;  Surgeon: Ninetta Lights, MD;  Location: [facility];  Service: Orthopedics;  Laterality: Left;   left total knee revision, tibial and patellar components    There were no vitals filed for this visit.    Subjective Assessment - 11/17/19 1642    Subjective Chuck reports that his R knee had a "pop" when shifting in his chair at home.  This concerned him because her has already had a revision of his L TKA and he wants to avoid a R TKA revision.    Pertinent History 2016 Back surgery; L TKA and revision; R TKA; Covid positive in 2020; on disability; L posterior tibial tendonitis.    Limitations Standing;Walking    How long can you sit comfortably? Limited by back.    How long can you stand comfortably? < 5 minutes due to L ankle.    How long can you walk comfortably? Limited to < 5 minutes due to L ankle.    Patient Stated Goals Get R knee strong so it won't "dislocate."    Currently in Pain? Yes    Pain Score 5     Pain Location Other (Comment)   R knee, L ankle and low back   Pain Descriptors / Indicators Aching;Burning;Jabbing;Sore;Sharp  Pain Type Acute pain    Pain Onset In the past 7 days    Pain Frequency Occasional    Aggravating Factors  Certain movements    Pain Relieving Factors Rest    Effect of Pain on Daily Activities Limits WB function along with L ankle.              [facility] PT Assessment - 11/17/19 0001      Assessment   Medical Diagnosis R knee pain    Referring Provider (PT) [facility] C    Onset Date/Surgical Date 11/11/19      Balance Screen   Has the patient fallen in the past 6 months No    Has the patient had a decrease in activity level because of a fear of falling?  No    Is the patient reluctant to leave their home because of a fear of falling?  No      Prior Function   Level of Independence Independent    Vocation On disability      ROM / Strength   AROM / PROM / Strength AROM;Strength      AROM   Overall AROM  Deficits    AROM Assessment Site Knee    Right/Left Knee Right;Left    Right Knee Extension 0    Right Knee Flexion 117     Left Knee Extension 0    Left Knee Flexion 122      Strength   Overall Strength Deficits    Strength Assessment Site Knee    Right/Left Knee Left;Right    Right Knee Flexion 5/5    Right Knee Extension 4-/5    Left Knee Flexion 5/5    Left Knee Extension 4+/5                      Objective measurements completed on examination: See above findings.               PT Education - 11/17/19 1649    Education Details Provided and reviewed an appropriate HEP.    Person(s) Educated Patient    Methods Explanation;Demonstration;Tactile cues;Verbal cues;Handout    Comprehension Verbal cues required;Need further instruction;Returned demonstration;Verbalized understanding;Tactile cues required            PT Short Term Goals - 11/17/19 1659      PT SHORT TERM GOAL #1   Title Raysean will be independent and compliant with his starter HEP.    Baseline No HEP.    Time 2    Period Weeks    Status New    Target Date 12/02/19             PT Long Term Goals - 11/17/19 1704      PT LONG TERM GOAL #1   Title [name] will report R knee pain consistently 0-3/10 on the Numeric Pain Rating Scale.    Baseline 5/10    Time 6    Period Weeks    Status New    Target Date 12/30/19      PT LONG TERM GOAL #2   Title [name] will improve R quadriceps strength to 4+/5 or better to avoid subluxing patella.    Baseline 4-/5 and subluxing patella.    Time 6    Period Weeks    Status New    Target Date 12/30/19      PT LONG TERM GOAL #3   Title [name] will be able to stand and walk  for 20 minutes or more without R knee limitations.    Baseline < 5 minutes (L ankle and R knee).    Time 6    Period Weeks    Status New    Target Date 12/30/19      PT LONG TERM GOAL #4   Title [name] will be independent and compliant with his final DC HEP.    Baseline New HEP today.    Time 6    Period Weeks    Status New    Target Date 12/30/19                  Plan -  11/17/19 1652    Clinical Impression Statement [name] has weak R quadriceps as a result of backing off activities due to a L posterior tibial tendonitis.  His previously replaced R knee is not tracking well and it subluxes as a result.  Quadriceps strength and stability work should resolve the problem and allow Chuck to increase his physical activity without R knee restrictions.    Personal Factors and Comorbidities Comorbidity 1    Comorbidities 2016 Back surgery; L TKA and revision; R TKA; Covid positive in 2020; on disability    Examination-Activity Limitations Bend;Stairs    Examination-Participation Restrictions Community Activity;Driving    Stability/Clinical Decision Making Evolving/Moderate complexity    Clinical Decision Making Moderate    Rehab Potential Good    PT Frequency 2x / week    PT Duration 6 weeks    PT Treatment/Interventions ADLs/Self Care Home Management;Aquatic Therapy;Iontophoresis 4mg /ml Dexamethasone;Electrical Stimulation;Cryotherapy;Therapeutic activities;Stair training;Gait training;Therapeutic exercise;Balance training;Neuromuscular re-education;Patient/family education;Manual techniques;Passive range of motion    PT Next Visit Plan Progress quadriceps strength work (leg press, knee extension in pain-free limited range).    PT Home Exercise Plan See patient instructions.    Consulted and Agree with Plan of Care Patient           Patient will benefit from skilled therapeutic intervention in order to improve the following deficits and impairments:  Abnormal gait, Decreased activity tolerance, Pain, Obesity, Increased edema, Difficulty walking, Decreased strength, Decreased range of motion, Decreased endurance  Visit Diagnosis: Stiffness of right knee, not elsewhere classified  Acute pain of right knee  Difficulty walking     Problem List Patient Active Problem List   Diagnosis Date Noted  . Posterior tibial tendonitis, left 11/14/2019  . Colon cancer  screening   . Benign neoplasm of transverse colon   . HYPOGONADISM 06/12/2010  . OBSTRUCTIVE SLEEP APNEA 05/07/2010  . HYPERLIPIDEMIA 10/24/2009  . ABDOMINAL PAIN, EPIGASTRIC 09/17/2009  . Lumbago 12/05/2008  . GERD 11/15/2007  . PALPITATIONS 11/15/2007  . OSTEOARTHRITIS 07/09/2007  . LUMBAR STRAIN 07/09/2007  . ANXIETY 02/01/2007  . ALLERGIC RHINITIS 02/01/2007  . [place], [place] 02/01/2007  . CHICKENPOX, HX OF 02/01/2007    Farley Ly PT, MPT 11/17/2019, 5:08 PM  [facility] [address] [place], [place], [address] Phone: [phone]   Fax:  [phone]  Name: TAYGEN ACKLIN MRN: [id number] Date of Birth: 1955/06/04

## 2019-11-17 NOTE — Patient Instructions (Signed)
Access Code: [email or web address] URL: [email or web address] Date: 11/17/2019 Prepared by: Vista Mink  Exercises Supine Quadricep Sets - 2-5 x daily - 7 x weekly - 10-20 sets - 5 reps - 5 hold Small Range Straight Leg Raise - 1 x daily - 7 x weekly - 5 sets - 5 reps - 3 secondsinutes hold

## 2019-11-21 ENCOUNTER — Encounter: Payer: Medicare HMO | Admitting: Rehabilitative and Restorative Service Providers"

## 2019-11-23 ENCOUNTER — Ambulatory Visit: Payer: 59 | Admitting: Orthopaedic Surgery

## 2019-11-24 ENCOUNTER — Other Ambulatory Visit: Payer: Self-pay

## 2019-11-24 ENCOUNTER — Ambulatory Visit: Payer: Medicare HMO | Admitting: Rehabilitative and Restorative Service Providers"

## 2019-11-24 ENCOUNTER — Encounter: Payer: Self-pay | Admitting: Rehabilitative and Restorative Service Providers"

## 2019-11-24 DIAGNOSIS — R262 Difficulty in walking, not elsewhere classified: Secondary | ICD-10-CM

## 2019-11-24 DIAGNOSIS — M25661 Stiffness of right knee, not elsewhere classified: Secondary | ICD-10-CM

## 2019-11-24 DIAGNOSIS — M25561 Pain in right knee: Secondary | ICD-10-CM | POA: Diagnosis not present

## 2019-11-24 NOTE — Therapy (Signed)
[place], [place], [address] Phone: [phone]   Fax:  [phone]  Physical Therapy Treatment  Patient Details  Name: Raymond Herrera MRN: [id number] Date of Birth: 1956/04/28 Referring Provider (PT): Marybelle Killings   Encounter Date: 11/24/2019   PT End of Session - 11/24/19 1033    Visit Number 2    Number of Visits 12    Date for PT Re-Evaluation 12/30/19    PT Start Time 0850    PT Stop Time 0930    PT Time Calculation (min) 40 min    Activity Tolerance No increased pain;Patient limited by fatigue    Behavior During Therapy [facility] for tasks assessed/performed           Past Medical History:  Diagnosis Date  . Allergy   . Anxiety   . Arthritis    Both Knees  . Chronic low back pain    had seen Dr. Rennis Harding, the Workers Comp case has been settled  . Depression   . Family history of adverse reaction to anesthesia    sister ponv  . GERD (gastroesophageal reflux disease)   . Hyperlipidemia   . Hypogonadism male    sees Dr. Risa Grill   . Plantar fasciitis    hx of  . 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 Risa Grill   . Sleep apnea, obstructive    sees Dr. Gwenette Greet, uses CPAP  setting of 12    Past Surgical History:  Procedure Laterality Date  . APPENDECTOMY    . BACK SURGERY  2016   Laminectomy  . CARPAL TUNNEL RELEASE Left 02-14-14   per Dr. [name]   . COLONOSCOPY  2008   clear, repeat in 10 yrs   . COLONOSCOPY WITH PROPOFOL N/A 11/06/2016   Procedure: COLONOSCOPY WITH PROPOFOL;  Surgeon: Milus Banister, MD;  Location: [facility];  Service: Endoscopy;  Laterality: N/A;  . JOINT REPLACEMENT  [id number]   bilateral knees, Dr. Percell Miller   . RADIOFREQUENCY ABLATION  02/2011   x 2  . Scrotal Abcess     I/D - healed  . TONSILLECTOMY    . TOTAL KNEE REVISION  10/01/2011   Procedure: TOTAL KNEE REVISION;  Surgeon: Ninetta Lights, MD;  Location: [place];  Service: Orthopedics;  Laterality: Left;   left total knee revision, tibial and patellar components    There were no vitals filed for this visit.   Subjective Assessment - 11/24/19 0921    Subjective Raymond Herrera notes early progress with his HEP.    Pertinent History 2016 Back surgery; L TKA and revision; R TKA; Covid positive in 2020; on disability; L posterior tibial tendonitis.    Limitations Standing;Walking    How long can you sit comfortably? Limited by back.    How long can you stand comfortably? < 5 minutes due to L ankle.    How long can you walk comfortably? Limited to < 5 minutes due to L ankle.    Patient Stated Goals Get R knee strong so it won't "dislocate."    Currently in Pain? Yes    Pain Score 4     Pain Location Knee    Pain Orientation Right    Pain Descriptors / Indicators Aching    Pain Type Acute pain    Pain Onset In the past 7 days    Pain Frequency Occasional    Aggravating Factors  Instability at random times.  Better since evaluation.  Pain Relieving Factors Exercise and rest.    Effect of Pain on Daily Activities Unable to golf, stand or walk longer distances.                             [place] Adult PT Treatment/Exercise - 11/24/19 0001      Neuro Re-ed    Neuro Re-ed Details  Wide heel to toe balance 3X eyes open & 3X eyes closed 20 seconds      Exercises   Exercises Knee/Hip      Knee/Hip Exercises: Stretches   Other Knee/Hip Stretches Upper heel cords box 2X 1 minute      Knee/Hip Exercises: Aerobic   Stationary Bike Seat 9 9 minutes      Knee/Hip Exercises: Machines for Strengthening   Cybex Knee Extension 2 sets of 10 @ 35#      Knee/Hip Exercises: Standing   Other Standing Knee Exercises --      Knee/Hip Exercises: Seated   Long Arc Quad Strengthening;3 sets;5 reps   Seated SLR with 3 pillows behind back                 PT Education - 11/24/19 1032    Education Details Reviewed HEP.  Raymond Herrera will focus on seated straight leg raises for now.     Person(s) Educated Patient    Methods Explanation;Demonstration;Verbal cues    Comprehension Verbalized understanding;Returned demonstration;Need further instruction;Verbal cues required            PT Short Term Goals - 11/24/19 1032      PT SHORT TERM GOAL #1   Title [name] will be independent and compliant with his starter HEP.    Baseline No HEP.    Time 2    Period Weeks    Status Achieved    Target Date 12/02/19             PT Long Term Goals - 11/17/19 1704      PT LONG TERM GOAL #1   Title Raymond Herrera will report R knee pain consistently 0-3/10 on the Numeric Pain Rating Scale.    Baseline 5/10    Time 6    Period Weeks    Status New    Target Date 12/30/19      PT LONG TERM GOAL #2   Title Raymond Herrera will improve R quadriceps strength to 4+/5 or better to avoid subluxing patella.    Baseline 4-/5 and subluxing patella.    Time 6    Period Weeks    Status New    Target Date 12/30/19      PT LONG TERM GOAL #3   Title Raymond Herrera will be able to stand and walk for 20 minutes or more without R knee limitations.    Baseline < 5 minutes (L ankle and R knee).    Time 6    Period Weeks    Status New    Target Date 12/30/19      PT LONG TERM GOAL #4   Title Raymond Herrera will be independent and compliant with his final DC HEP.    Baseline New HEP today.    Time 6    Period Weeks    Status New    Target Date 12/30/19                 Plan - 11/24/19 1033    Clinical Impression Statement Raymond Herrera reports no knee instability over the  past several days.  Knee extension strength and balance remain the focus of his program to allow [facility] to improve his standing and walking endurance and return to golf.    Personal Factors and Comorbidities Comorbidity 1    Comorbidities 2016 Back surgery; L TKA and revision; R TKA; Covid positive in 2020; on disability    Examination-Activity Limitations Bend;Stairs    Examination-Participation Restrictions Community Activity;Driving     Stability/Clinical Decision Making Evolving/Moderate complexity    Rehab Potential Good    PT Frequency 2x / week    PT Duration 6 weeks    PT Treatment/Interventions ADLs/Self Care Home Management;Aquatic Therapy;Iontophoresis 4mg /ml Dexamethasone;Electrical Stimulation;Cryotherapy;Therapeutic activities;Stair training;Gait training;Therapeutic exercise;Balance training;Neuromuscular re-education;Patient/family education;Manual techniques;Passive range of motion    PT Next Visit Plan Progress quadriceps strength work (leg press, knee extension in pain-free limited range).    PT Home Exercise Plan See patient instructions.    Consulted and Agree with Plan of Care Patient           Patient will benefit from skilled therapeutic intervention in order to improve the following deficits and impairments:  Abnormal gait, Decreased activity tolerance, Pain, Obesity, Increased edema, Difficulty walking, Decreased strength, Decreased range of motion, Decreased endurance  Visit Diagnosis: Stiffness of right knee, not elsewhere classified  Acute pain of right knee  Difficulty walking     Problem List Patient Active Problem List   Diagnosis Date Noted  . Posterior tibial tendonitis, left 11/14/2019  . Colon cancer screening   . Benign neoplasm of transverse colon   . HYPOGONADISM 06/12/2010  . OBSTRUCTIVE SLEEP APNEA 05/07/2010  . HYPERLIPIDEMIA 10/24/2009  . ABDOMINAL PAIN, EPIGASTRIC 09/17/2009  . Lumbago 12/05/2008  . GERD 11/15/2007  . PALPITATIONS 11/15/2007  . OSTEOARTHRITIS 07/09/2007  . LUMBAR STRAIN 07/09/2007  . ANXIETY 02/01/2007  . ALLERGIC RHINITIS 02/01/2007  . [place], [place] 02/01/2007  . CHICKENPOX, HX OF 02/01/2007    Farley Ly PT, MPT 11/24/2019, 10:35 AM  [facility] Physical Therapy [address] [place], [place], [address] Phone: [phone]   Fax:  [phone]  Name: SHAFTER JUPIN MRN: [id number] Date of Birth:  [birth year]

## 2019-11-29 ENCOUNTER — Encounter: Payer: Self-pay | Admitting: Rehabilitative and Restorative Service Providers"

## 2019-11-29 ENCOUNTER — Ambulatory Visit: Payer: Medicare HMO | Admitting: Rehabilitative and Restorative Service Providers"

## 2019-11-29 ENCOUNTER — Other Ambulatory Visit: Payer: Self-pay

## 2019-11-29 ENCOUNTER — Telehealth: Payer: Self-pay | Admitting: Orthopaedic Surgery

## 2019-11-29 DIAGNOSIS — M25661 Stiffness of right knee, not elsewhere classified: Secondary | ICD-10-CM

## 2019-11-29 DIAGNOSIS — M25561 Pain in right knee: Secondary | ICD-10-CM

## 2019-11-29 DIAGNOSIS — R262 Difficulty in walking, not elsewhere classified: Secondary | ICD-10-CM

## 2019-11-29 NOTE — Telephone Encounter (Signed)
Patient stopped by after his PT appointment.    His PT, Vista Mink. 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 He was instructed to see if Dr.Yates would prescribe him something to help.  Call back: [phone]

## 2019-11-29 NOTE — Telephone Encounter (Signed)
I called left message no answer cell or home phone

## 2019-11-29 NOTE — Telephone Encounter (Signed)
Please advise 

## 2019-11-29 NOTE — Therapy (Signed)
Raymond Herrera, [place], [address] Phone: [phone]   Fax:  [phone]  Physical Therapy Treatment  Patient Details  Name: Raymond Herrera MRN: [id number] Date of Birth: [birth year] Referring Provider (PT): Marybelle Killings   Encounter Date: 11/29/2019   PT End of Session - 11/29/19 1611    Visit Number 3    Number of Visits 12    Date for PT Re-Evaluation 12/30/19    PT Start Time 1430    PT Stop Time 5329    PT Time Calculation (min) 45 min    Activity Tolerance No increased pain;Patient limited by fatigue    Behavior During Therapy [facility] for tasks assessed/performed           Past Medical History:  Diagnosis Date  . Allergy   . Anxiety   . Arthritis    Both Knees  . Chronic low back pain    had seen Dr. Rennis Herrera, the Workers Comp case has been settled  . Depression   . Family history of adverse reaction to anesthesia    sister ponv  . GERD (gastroesophageal reflux disease)   . Hyperlipidemia   . Hypogonadism male    sees Dr. Risa Herrera   . Plantar fasciitis    hx of  . 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 Risa Herrera   . Sleep apnea, obstructive    sees Dr. Gwenette Herrera, uses CPAP  setting of 12    Past Surgical History:  Procedure Laterality Date  . APPENDECTOMY    . BACK SURGERY  2016   Laminectomy  . CARPAL TUNNEL RELEASE Left 02-14-14   per Dr. [name]   . COLONOSCOPY  2008   clear, repeat in 10 yrs   . COLONOSCOPY WITH PROPOFOL N/A 11/06/2016   Procedure: COLONOSCOPY WITH PROPOFOL;  Surgeon: Raymond Banister, MD;  [facility];  Service: Endoscopy;  Laterality: N/A;  . JOINT REPLACEMENT  [id number]   bilateral knees, Dr. Percell Herrera   . RADIOFREQUENCY ABLATION  02/2011   x 2  . Scrotal Abcess     I/D - healed  . TONSILLECTOMY    . TOTAL KNEE REVISION  10/01/2011   Procedure: TOTAL KNEE REVISION;  Surgeon: Raymond Lights, MD;  Location: [place];  Service: Orthopedics;  Laterality: Left;   left total knee revision, tibial and patellar components    There were no vitals filed for this visit.   Subjective Assessment - 11/29/19 1609    Subjective Chuck notes his knee was not as painful over the holiday weekend.    Pertinent History 2016 Back surgery; L TKA and revision; R TKA; Covid positive in 2020; on disability; L posterior tibial tendonitis.    Limitations Standing;Walking    How long can you sit comfortably? Limited by back.    How long can you stand comfortably? < 5 minutes due to L ankle.    How long can you walk comfortably? Limited to < 5 minutes due to L ankle.    Patient Stated Goals Get R knee strong so it won't "dislocate."    Currently in Pain? Yes    Pain Location Knee    Pain Orientation Right    Pain Descriptors / Indicators Aching    Pain Type Acute pain    Pain Onset 1 to 4 weeks ago    Pain Frequency Occasional    Aggravating Factors  Less instability but endurance still limited with weight-bearing.  Pain Relieving Factors Exercise and rest.    Effect of Pain on Daily Activities Unable to golf, stand or walk long distances.                             [place] Adult PT Treatment/Exercise - 11/29/19 0001      Neuro Re-ed    Neuro Re-ed Details  Wide heel to toe balance 3X eyes open & 3X eyes closed 20 seconds.  Breaks needed due to posterior tibial tendonitis on the uninvolved side.      Exercises   Exercises Knee/Hip      Knee/Hip Exercises: Stretches   Other Knee/Hip Stretches Upper heel cords box 2X 1 minute      Knee/Hip Exercises: Aerobic   Stationary Bike If time      Knee/Hip Exercises: Machines for Strengthening   Cybex Knee Extension 3 sets of 10 @ 35# with slow eccentrics    Total Gym Leg Press 2 sets of 10 75# and 100#      Knee/Hip Exercises: Seated   Long Arc Quad Strengthening;3 sets;5 reps   Seated SLR with 3 pillows behind back                 PT Education - 11/29/19 1610    Education Details  Reviewed HEP.    Person(s) Educated Patient    Methods Explanation;Demonstration;Verbal cues    Comprehension Verbalized understanding;Returned demonstration;Need further instruction;Verbal cues required            PT Short Term Goals - 11/24/19 1032      PT SHORT TERM GOAL #1   Title [place] will be independent and compliant with his starter HEP.    Baseline No HEP.    Time 2    Period Weeks    Status Achieved    Target Date 12/02/19             PT Long Term Goals - 11/29/19 1611      PT LONG TERM GOAL #1   Title Raymond Herrera will report R knee pain consistently 0-3/10 on the Numeric Pain Rating Scale.    Baseline 5/10    Time 6    Period Weeks    Status On-going      PT LONG TERM GOAL #2   Title Raymond Herrera will improve R quadriceps strength to 4+/5 or better to avoid subluxing patella.    Baseline 4-/5 and subluxing patella.    Time 6    Period Weeks    Status On-going      PT LONG TERM GOAL #3   Title Raymond Herrera will be able to stand and walk for 20 minutes or more without R knee limitations.    Baseline < 5 minutes (L ankle and R knee).    Time 6    Period Weeks    Status On-going      PT LONG TERM GOAL #4   Title Raymond Herrera will be independent and compliant with his final DC HEP.    Baseline New HEP today.    Time 6    Period Weeks    Status On-going                 Plan - 11/29/19 1612    Clinical Impression Statement Raymond Herrera notes noticeable pain improvement since starting PT and no instability.  Standing and walking endurance are still limited and he is unable to golf due to fear of instability  and his L elbow and foot.  Continue R quadriceps strength emphasis with branching into other areas of concern (L foot and elbow) based on meeting R knee LTGs and Dr. Lorin Mercy' recommendations.    Personal Factors and Comorbidities Comorbidity 1    Comorbidities 2016 Back surgery; L TKA and revision; R TKA; Covid positive in 2020; on disability    Examination-Activity Limitations  Bend;Stairs    Examination-Participation Restrictions Community Activity;Driving    Stability/Clinical Decision Making Evolving/Moderate complexity    Rehab Potential Good    PT Frequency 2x / week    PT Duration 6 weeks    PT Treatment/Interventions ADLs/Self Care Home Management;Aquatic Therapy;Iontophoresis 4mg /ml Dexamethasone;Electrical Stimulation;Cryotherapy;Therapeutic activities;Stair training;Gait training;Therapeutic exercise;Balance training;Neuromuscular re-education;Patient/family education;Manual techniques;Passive range of motion    PT Next Visit Plan Progress quadriceps strength work (leg press, knee extension in pain-free limited range).    PT Home Exercise Plan See patient instructions.    Consulted and Agree with Plan of Care Patient           Patient will benefit from skilled therapeutic intervention in order to improve the following deficits and impairments:  Abnormal gait, Decreased activity tolerance, Pain, Obesity, Increased edema, Difficulty walking, Decreased strength, Decreased range of motion, Decreased endurance  Visit Diagnosis: Stiffness of right knee, not elsewhere classified  Acute pain of right knee  Difficulty walking     Problem List Patient Active Problem List   Diagnosis Date Noted  . Posterior tibial tendonitis, left 11/14/2019  . Colon cancer screening   . Benign neoplasm of transverse colon   . HYPOGONADISM 06/12/2010  . OBSTRUCTIVE SLEEP APNEA 05/07/2010  . HYPERLIPIDEMIA 10/24/2009  . ABDOMINAL PAIN, EPIGASTRIC 09/17/2009  . Lumbago 12/05/2008  . GERD 11/15/2007  . PALPITATIONS 11/15/2007  . OSTEOARTHRITIS 07/09/2007  . LUMBAR STRAIN 07/09/2007  . ANXIETY 02/01/2007  . ALLERGIC RHINITIS 02/01/2007  . [place], [place] 02/01/2007  . CHICKENPOX, HX OF 02/01/2007    Farley Ly PT, MPT 11/29/2019, 4:14 PM  [facility] Physical Therapy [address] [place], [place], [address] Phone: [phone]   Fax:   [phone]  Name: YOSSI HINCHMAN MRN: [id number] Date of Birth: 1956/01/01

## 2019-11-30 ENCOUNTER — Ambulatory Visit: Payer: Medicare HMO | Admitting: Pulmonary Disease

## 2019-11-30 ENCOUNTER — Encounter: Payer: Self-pay | Admitting: Pulmonary Disease

## 2019-11-30 ENCOUNTER — Telehealth: Payer: Self-pay | Admitting: Pulmonary Disease

## 2019-11-30 VITALS — BP 144/80 | HR 85 | Temp 98.8°F | Ht 75.0 in | Wt 397.4 lb

## 2019-11-30 DIAGNOSIS — G4733 Obstructive sleep apnea (adult) (pediatric): Secondary | ICD-10-CM | POA: Diagnosis not present

## 2019-11-30 NOTE — Telephone Encounter (Signed)
FYI I called discussed. Old injury 1976, surgery biceps 5 yrs ago , has not had full ROV for 20 plus years. It is not gout in his elbow.

## 2019-11-30 NOTE — Patient Instructions (Signed)
1.  Contact [facility]-information about your current pressure settings 2.  Request for machine to be switched to auto titrating setting-setting of 5-20 3.  Continue use at this pressure for 3 to 4 weeks and then get a download from the machine 4.  [facility]-request for supply of chinstrap to be used with CPAP 5.  Can [facility] supply of mouthguard for bruxism-grinding?  We will see you back in 4 to 6 weeks  The dryness is likely related to mouth opening which a chinstrap should help Getting a download after about 4 to 6 weeks gives [place] an idea of what pressure you require and if the auto titrating pressure is not as tolerated we can go to a lower pressure compared to what you are currently on.  Continue to use CPAP as you are currently

## 2019-11-30 NOTE — Telephone Encounter (Signed)
noted 

## 2019-11-30 NOTE — Telephone Encounter (Signed)
Attempted to call pt but unable to reach. Left message for him to return call. °

## 2019-11-30 NOTE — Progress Notes (Signed)
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 (Patient taking differently: Take 200 mg by mouth 2 (two) times daily as needed for mild pain. )   EPINEPHrine (EPIPEN 2-PAK) 0.3 mg/0.3 mL SOAJ Inject 0.3 mLs (0.3 mg total) into the muscle once.   esomeprazole (NEXIUM) 40 MG capsule take twice a day (Patient taking differently: Take 40 mg by mouth daily as needed (ACID REFLUX). )   fluticasone (FLONASE) 50 MCG/ACT nasal spray USE 2 SPRAYS IN EACH NOSTRIL ONCE DAILY (Patient taking differently: USE 1 SPRAYS IN EACH NOSTRIL ONCE DAILY AS NEEDED FOR ALLERGIES)   methocarbamol (ROBAXIN) 500 MG tablet Take 1 tablet (500 mg total) by mouth 4 (four) times daily. (Patient taking differently: Take 500 mg by mouth 2 (two) times daily as needed for muscle spasms. As needed)   oxyCODONE (OXYCONTIN) 15 mg 12 hr tablet Take 15 mg by mouth every 12 (twelve) hours.   Oxycodone HCl 10 MG TABS Take 1  tablet (10 mg total) by mouth every 4 (four) hours as needed (pain).   Testosterone Cypionate 200 MG/ML KIT Inject 100 mg into the muscle every Friday.   predniSONE (DELTASONE) 5 MG tablet Take by mouth See admin instructions. TAPERED DOSE 6,5,4,3,2,1 (Patient not taking: Reported on 11/30/2019)   No facility-administered encounter medications on file as of 11/30/2019.    Allergies as of 11/30/2019 - Review Complete 11/30/2019  Allergen Reaction Noted   Bee venom Anaphylaxis and Swelling 10/01/2011   Tramadol-acetaminophen Anaphylaxis and Itching 10/08/2006   Lyrica [pregabalin]  04/24/2015    Past Medical History:  Diagnosis Date   Allergy    Anxiety    Arthritis    Both Knees   Chronic low back pain    had seen Dr. Rennis Harding, the Workers Comp case has been settled   Depression    Family history of adverse reaction to anesthesia    sister ponv   GERD (gastroesophageal reflux disease)    Hyperlipidemia    Hypogonadism male    sees Dr. Risa Grill    Plantar fasciitis    hx of   Scrotal abscess 06-06-10   MRSA, lanced by Dr. Risa Grill    Sleep apnea, obstructive    sees Dr. Gwenette Greet, uses CPAP  setting of 12    Past Surgical History:  Procedure Laterality Date   APPENDECTOMY  BACK SURGERY  2016   Laminectomy   CARPAL TUNNEL RELEASE Left 02-14-14   per Dr. [name]    COLONOSCOPY  2008   clear, repeat in 10 yrs    COLONOSCOPY WITH PROPOFOL N/A 11/06/2016   Procedure: COLONOSCOPY WITH PROPOFOL;  Surgeon: Milus Banister, MD;  Location: [facility];  Service: Endoscopy;  Laterality: N/A;   JOINT REPLACEMENT  [id number]   bilateral knees, Dr. Percell Miller    RADIOFREQUENCY ABLATION  02/2011   x 2   Scrotal Abcess     I/D - healed   TONSILLECTOMY     TOTAL KNEE REVISION  10/01/2011   Procedure: TOTAL KNEE REVISION;  Surgeon: Ninetta Lights, MD;  Location: [facility];  Service: Orthopedics;  Laterality: Left;  left total knee revision, tibial and patellar  components    Family History  Problem Relation Age of Onset   Stroke Father    Anesthesia problems Neg Hx     Social History   Socioeconomic History   Marital status: Married    Spouse name: Not on file   Number of children: Not on file   Years of education: Not on file   Highest education level: Not on file  Occupational History   Not on file  Tobacco Use   Smoking status: Never Smoker   Smokeless tobacco: Never Used  Vaping Use   Vaping Use: Never used  Substance and Sexual Activity   Alcohol use: Yes    Alcohol/week: 0.0 standard drinks    Comment: occ   Drug use: No   Sexual activity: Not on file  Other Topics Concern   Not on file  Social History Narrative   Not on file   Social Determinants of Health   Financial Resource Strain:    Difficulty of Paying Living Expenses:   Food Insecurity:    Worried About Charity fundraiser in the Last Year:    Arboriculturist in the Last Year:   Transportation Needs:    Film/video editor (Medical):    Lack of Transportation (Non-Medical):   Physical Activity:    Days of Exercise per Week:    Minutes of Exercise per Session:   Stress:    Feeling of Stress :   Social Connections:    Frequency of Communication with Friends and Family:    Frequency of Social Gatherings with Friends and Family:    Attends Religious Services:    Active Member of Clubs or Organizations:    Attends Music therapist:    Marital Status:   Intimate Partner Violence:    Fear of Current or Ex-Partner:    Emotionally Abused:    Physically Abused:    Sexually Abused:     Review of Systems  Respiratory: Positive for apnea.   Psychiatric/Behavioral: Positive for sleep disturbance.    Vitals:   11/30/19 1326  BP: (!) 144/80  Pulse: 85  Temp: 98.8 F (37.1 C)  SpO2: 94%     Physical Exam Constitutional:      Appearance: He is obese.  HENT:     Head: Normocephalic.     Nose: No  congestion.     Mouth/Throat:     Mouth: Mucous membranes are moist.     Comments: Mallampati 4, crowded oropharynx Eyes:     General:        Right eye: No discharge.  Neck:     Comments: Neck is thick Cardiovascular:  Rate and Rhythm: Normal rate.     Pulses: Normal pulses.     Heart sounds: Normal heart sounds. No murmur heard.  No friction rub.  Pulmonary:     Effort: Pulmonary effort is normal. No respiratory distress.     Breath sounds: No stridor. No wheezing or rhonchi.  Musculoskeletal:     Cervical back: No rigidity or tenderness.  Neurological:     Mental Status: He is alert.  Psychiatric:        Mood and Affect: Mood normal.   Compliance data not available   Data Reviewed: Did review records from Dr. Gwenette Greet, no mention of the specific pressure the patient is set on at present  Assessment:  Severe obstructive sleep apnea -We will switch machine to auto titrating setting from a fixed pressure -Get a download in a few weeks -Prescription for a chinstrap for oral dryness which may indicate mouth hanging open at night  Bruxism -Needs oral device/guard  Plan/Recommendations: Contact adapt to figure out current pressure Adapt to change pressure to auto titrating setting 5-20 [employer] to supply a chinstrap  Follow-up in 4 to 6 weeks to get a handle on how much pressure is needed  Machine may be reset to a fixed pressure or continue with auto titrating setting as long as it is well-tolerated  Chinstrap will be needed for oral venting  Mouthguard will be needed for grinding-May need fashioning by a dentist  Follow-up in 4 to 6 weeks  Sherrilyn Rist MD [facility] 11/30/2019, 1:54 PM  CC: Corrington, Kip A, MD

## 2019-12-01 NOTE — Telephone Encounter (Signed)
LMTCB x2  

## 2019-12-02 ENCOUNTER — Encounter: Payer: Self-pay | Admitting: Rehabilitative and Restorative Service Providers"

## 2019-12-02 ENCOUNTER — Ambulatory Visit: Payer: Medicare HMO | Admitting: Rehabilitative and Restorative Service Providers"

## 2019-12-02 ENCOUNTER — Other Ambulatory Visit: Payer: Self-pay

## 2019-12-02 DIAGNOSIS — M25561 Pain in right knee: Secondary | ICD-10-CM | POA: Diagnosis not present

## 2019-12-02 DIAGNOSIS — R262 Difficulty in walking, not elsewhere classified: Secondary | ICD-10-CM

## 2019-12-02 DIAGNOSIS — M25661 Stiffness of right knee, not elsewhere classified: Secondary | ICD-10-CM | POA: Diagnosis not present

## 2019-12-02 NOTE — Therapy (Signed)
[facility] [place], [place], [address] Phone: [phone]   Fax:  [phone]  Physical Therapy Treatment  Patient Details  Name: Raymond Herrera MRN: [id number] Date of Birth: March 08, 1956 Referring Provider (PT): Marybelle Killings   Encounter Date: 12/02/2019   PT End of Session - 12/02/19 1008    Visit Number 4    Number of Visits 12    Date for PT Re-Evaluation 12/30/19    PT Start Time 0930    PT Stop Time 1009    PT Time Calculation (min) 39 min    Activity Tolerance No increased pain;Patient limited by fatigue    Behavior During Therapy [facility] for tasks assessed/performed           Past Medical History:  Diagnosis Date  . Allergy   . Anxiety   . Arthritis    Both Knees  . Chronic low back pain    had seen Dr. Rennis Harding, the Workers Comp case has been settled  . Depression   . Family history of adverse reaction to anesthesia    sister ponv  . GERD (gastroesophageal reflux disease)   . Hyperlipidemia   . Hypogonadism male    sees Dr. Risa Grill   . Plantar fasciitis    hx of  . 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 Risa Grill   . Sleep apnea, obstructive    sees Dr. Gwenette Greet, uses CPAP  setting of 12    Past Surgical History:  Procedure Laterality Date  . APPENDECTOMY    . BACK SURGERY  2016   Laminectomy  . CARPAL TUNNEL RELEASE Left 02-14-14   per Dr. [name]   . COLONOSCOPY  2008   clear, repeat in 10 yrs   . COLONOSCOPY WITH PROPOFOL N/A 11/06/2016   Procedure: COLONOSCOPY WITH PROPOFOL;  Surgeon: Milus Banister, MD;  Location: [facility];  Service: Endoscopy;  Laterality: N/A;  . JOINT REPLACEMENT  [id number]   bilateral knees, Dr. Percell Miller   . RADIOFREQUENCY ABLATION  02/2011   x 2  . Scrotal Abcess     I/D - healed  . TONSILLECTOMY    . TOTAL KNEE REVISION  10/01/2011   Procedure: TOTAL KNEE REVISION;  Surgeon: Ninetta Lights, MD;  Location: [facility];  Service: Orthopedics;  Laterality: Left;   left total knee revision, tibial and patellar components    There were no vitals filed for this visit.   Subjective Assessment - 12/02/19 1714    Subjective Raymond Herrera continues to note progress as compared to before he started PT.    Pertinent History 2016 Back surgery; L TKA and revision; R TKA; Covid positive in 2020; on disability; L posterior tibial tendonitis.    Limitations Standing;Walking    How long can you sit comfortably? Limited by back.    How long can you stand comfortably? < 5 minutes due to L ankle.    How long can you walk comfortably? Limited to < 5 minutes due to L ankle.    Patient Stated Goals Get R knee strong so it won't "dislocate."    Currently in Pain? No/denies    Pain Onset 1 to 4 weeks ago    Aggravating Factors  WB endurance    Pain Relieving Factors Exercise, rest, medication    Effect of Pain on Daily Activities Unable to golf, stand or walk for long periods of time  [place] Adult PT Treatment/Exercise - 12/02/19 0001      Neuro Re-ed    Neuro Re-ed Details  Wide heel to toe balance 3X eyes open & 3X eyes closed 20 seconds.  Breaks needed due to posterior tibial tendonitis on the uninvolved side.      Exercises   Exercises Knee/Hip      Knee/Hip Exercises: Stretches   Other Knee/Hip Stretches Upper heel cords box 2X 1 minute      Knee/Hip Exercises: Aerobic   Stationary Bike If time      Knee/Hip Exercises: Machines for Strengthening   Cybex Knee Extension 3 sets of 10 @ 35# with slow eccentrics    Total Gym Leg Press 2 sets of 10 at 100# slow eccentrics      Knee/Hip Exercises: Seated   Long Arc Quad Strengthening;3 sets;5 reps   Seated SLR with 3 pillows behind back                 PT Education - 12/02/19 1715    Education Details Reviewed HEP    Person(s) Educated Patient    Methods Explanation;Demonstration;Verbal cues    Comprehension Returned demonstration;Need further  instruction;Verbal cues required;Verbalized understanding            PT Short Term Goals - 11/24/19 1032      PT SHORT TERM GOAL #1   Title Raymond Herrera will be independent and compliant with his starter HEP.    Baseline No HEP.    Time 2    Period Weeks    Status Achieved    Target Date 12/02/19             PT Long Term Goals - 11/29/19 1611      PT LONG TERM GOAL #1   Title Raymond Herrera will report R knee pain consistently 0-3/10 on the Numeric Pain Rating Scale.    Baseline 5/10    Time 6    Period Weeks    Status On-going      PT LONG TERM GOAL #2   Title Raymond Herrera will improve R quadriceps strength to 4+/5 or better to avoid subluxing patella.    Baseline 4-/5 and subluxing patella.    Time 6    Period Weeks    Status On-going      PT LONG TERM GOAL #3   Title Raymond Herrera will be able to stand and walk for 20 minutes or more without R knee limitations.    Baseline < 5 minutes (L ankle and R knee).    Time 6    Period Weeks    Status On-going      PT LONG TERM GOAL #4   Title Raymond Herrera will be independent and compliant with his final DC HEP.    Baseline New HEP today.    Time 6    Period Weeks    Status On-going                 Plan - 12/02/19 1716    Clinical Impression Statement Raymond Herrera continues to note progress with his knee since starting physical therapy.  He will benefit from continued quadriceps strengthening to improve weight-bearing endurance and allow him to return to golf and to get into walking for exercise to lose weight.    Personal Factors and Comorbidities Comorbidity 1    Comorbidities 2016 Back surgery; L TKA and revision; R TKA; Covid positive in 2020; on disability    Examination-Activity Limitations Bend;Stairs    Examination-Participation Restrictions  Community Activity;Driving    Stability/Clinical Decision Making Evolving/Moderate complexity    Rehab Potential Good    PT Frequency 2x / week    PT Duration 6 weeks    PT Treatment/Interventions  ADLs/Self Care Home Management;Aquatic Therapy;Iontophoresis 4mg /ml Dexamethasone;Electrical Stimulation;Cryotherapy;Therapeutic activities;Stair training;Gait training;Therapeutic exercise;Balance training;Neuromuscular re-education;Patient/family education;Manual techniques;Passive range of motion    PT Next Visit Plan Progress quadriceps strength work (leg press, knee extension in pain-free limited range).    PT Home Exercise Plan See patient instructions.    Consulted and Agree with Plan of Care Patient           Patient will benefit from skilled therapeutic intervention in order to improve the following deficits and impairments:  Abnormal gait, Decreased activity tolerance, Pain, Obesity, Increased edema, Difficulty walking, Decreased strength, Decreased range of motion, Decreased endurance  Visit Diagnosis: Stiffness of right knee, not elsewhere classified  Difficulty walking  Acute pain of right knee     Problem List Patient Active Problem List   Diagnosis Date Noted  . Posterior tibial tendonitis, left 11/14/2019  . Colon cancer screening   . Benign neoplasm of transverse colon   . HYPOGONADISM 06/12/2010  . OBSTRUCTIVE SLEEP APNEA 05/07/2010  . HYPERLIPIDEMIA 10/24/2009  . ABDOMINAL PAIN, EPIGASTRIC 09/17/2009  . Lumbago 12/05/2008  . GERD 11/15/2007  . PALPITATIONS 11/15/2007  . OSTEOARTHRITIS 07/09/2007  . LUMBAR STRAIN 07/09/2007  . ANXIETY 02/01/2007  . ALLERGIC RHINITIS 02/01/2007  . [place], [place] 02/01/2007  . CHICKENPOX, HX OF 02/01/2007    Farley Ly PT, MPT 12/02/2019, 5:18 PM  [facility] Physical Therapy [address] [place], [place], [address] Phone: [phone]   Fax:  [phone]  Name: Raymond Herrera MRN: [id number] Date of Birth: 1955-12-09

## 2019-12-02 NOTE — Telephone Encounter (Signed)
LMTCB x3 for pt. We have attempted to contact pt several times with no success or call back from pt. Per triage protocol, message will be closed.   

## 2019-12-13 ENCOUNTER — Encounter: Payer: Medicare HMO | Admitting: Physical Therapy

## 2019-12-13 ENCOUNTER — Other Ambulatory Visit: Payer: Self-pay

## 2019-12-13 ENCOUNTER — Encounter: Payer: Self-pay | Admitting: Orthopaedic Surgery

## 2019-12-13 ENCOUNTER — Ambulatory Visit: Payer: Medicare HMO | Admitting: Orthopaedic Surgery

## 2019-12-13 VITALS — BP 146/95 | HR 88 | Ht 75.0 in | Wt 388.0 lb

## 2019-12-13 DIAGNOSIS — M958 Other specified acquired deformities of musculoskeletal system: Secondary | ICD-10-CM | POA: Diagnosis not present

## 2019-12-13 DIAGNOSIS — M25572 Pain in left ankle and joints of left foot: Secondary | ICD-10-CM | POA: Diagnosis not present

## 2019-12-13 NOTE — Progress Notes (Signed)
Office Visit Note   Patient: Raymond Herrera           Date of Birth: 1955-08-14           MRN: [id number] Visit Date: 12/13/2019              Requested by: [name], MD [place],  [place] [address] PCP: [name], MD   Assessment & Plan: Visit Diagnoses:  1. Osteochondral defect of ankle   2. Pain in left ankle and joints of left foot   3.      Left ankle mechanical symptoms  Plan: With patient's ongoing ankle pain mechanical symptoms that failed conservative treatment recommend proceeding with MRI to better evaluate the medial talar dome lesion that was seen on x-ray.  Follow-up with Dr. Lorin Mercy after completion to discuss results and further treatment options.  Patient understands that ultimately come down to him needing outpatient arthroscopy with debridement we discussed possible microfracturing.  He can continue the ASO brace if he feels that this is helping with his pain.  All questions answered.  Follow-Up Instructions: Return for Follow-up 2 weeks with Dr. Lorin Mercy to review MRI left ankle.   Orders:  Orders Placed This Encounter  Procedures  . MR Ankle Left w/o contrast   No orders of the defined types were placed in this encounter.     Procedures: No procedures performed   Clinical Data: No additional findings.   Subjective: Chief Complaint  Patient presents with  . Left Ankle - Pain    HPI 64 year old white male returns for recheck of his left ankle pain.  She continues have ongoing pain and describes feeling of catching and popping when he is ambulating.  He has failed conservative treatment with previous ankle intra-articular Marcaine/Depo-Medrol injection with Dr. Fredonia Highland about 6 weeks ago.  He is also been wearing the ankle brace without any dramatic improvement.  States that this is having a significant negative impact on his quality of life because he is not able to be as active as he would like to  be.   Objective: Vital Signs: BP (!) 146/95   Pulse 88   Ht 6\' 3"  (1.905 m)   Wt (!) 388 lb (176 kg)   BMI 48.50 kg/m   Physical Exam Gait is antalgic.  Left ankle has good range of motion but he does have pain with dorsiflexion.  He is tender across the tibiotalar joint line.  Does have mild to moderate ankle swelling.  Neurovascular intact. Ortho Exam  Specialty Comments:  No specialty comments available.  Imaging: No results found.   PMFS History: Patient Active Problem List   Diagnosis Date Noted  . Posterior tibial tendonitis, left 11/14/2019  . Colon cancer screening   . Benign neoplasm of transverse colon   . HYPOGONADISM 06/12/2010  . OBSTRUCTIVE SLEEP APNEA 05/07/2010  . HYPERLIPIDEMIA 10/24/2009  . ABDOMINAL PAIN, EPIGASTRIC 09/17/2009  . Lumbago 12/05/2008  . GERD 11/15/2007  . PALPITATIONS 11/15/2007  . OSTEOARTHRITIS 07/09/2007  . LUMBAR STRAIN 07/09/2007  . ANXIETY 02/01/2007  . ALLERGIC RHINITIS 02/01/2007  . [place], [place] 02/01/2007  . CHICKENPOX, HX OF 02/01/2007   Past Medical History:  Diagnosis Date  . Allergy   . Anxiety   . Arthritis    Both Knees  . Chronic low back pain    had seen Dr. Rennis Harding, the Workers Comp case has been settled  . Depression   .  Family history of adverse reaction to anesthesia    sister ponv  . GERD (gastroesophageal reflux disease)   . Hyperlipidemia   . Hypogonadism male    sees Dr. Risa Grill   . Plantar fasciitis    hx of  . 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 Risa Grill   . Sleep apnea, obstructive    sees Dr. Gwenette Greet, uses CPAP  setting of 12    Family History  Problem Relation Age of Onset  . Stroke Father   . Anesthesia problems Neg Hx     Past Surgical History:  Procedure Laterality Date  . APPENDECTOMY    . BACK SURGERY  2016   Laminectomy  . CARPAL TUNNEL RELEASE Left 02-14-14   per Dr. [name]   . COLONOSCOPY  2008   clear, repeat in 10 yrs   . COLONOSCOPY WITH PROPOFOL  N/A 11/06/2016   Procedure: COLONOSCOPY WITH PROPOFOL;  Surgeon: Milus Banister, MD;  [facility];  Service: Endoscopy;  Laterality: N/A;  . JOINT REPLACEMENT  [id number]   bilateral knees, Dr. Percell Miller   . RADIOFREQUENCY ABLATION  02/2011   x 2  . Scrotal Abcess     I/D - healed  . TONSILLECTOMY    . TOTAL KNEE REVISION  10/01/2011   Procedure: TOTAL KNEE REVISION;  Surgeon: Ninetta Lights, MD;  Location: [facility];  Service: Orthopedics;  Laterality: Left;  left total knee revision, tibial and patellar components   Social History   Occupational History  . Not on file  Tobacco Use  . Smoking status: Never Smoker  . Smokeless tobacco: Never Used  Vaping Use  . Vaping Use: Never used  Substance and Sexual Activity  . Alcohol use: Yes    Alcohol/week: 0.0 standard drinks    Comment: occ  . Drug use: No  . Sexual activity: Not on file

## 2019-12-13 NOTE — Therapy (Unsigned)
[place], [place], [address] Phone: [phone]   Fax:  [phone]  Patient Details  Name: Raymond Herrera MRN: [id number] Date of Birth: 1955/11/02 Referring Provider:  Marybelle Killings, MD  Encounter Date: 12/13/2019  Pt arriving to therapy today reporting increased pain in his L ankle of 8-9/10. Pt wearing ankle support brace upon arrival. Pt stating increased swelling at night when his brace is removed and inability to sleep. Pt reporting, "my knee is the least of my worries". I spoke to Dr. Lorin Mercy and his nurse Inez Catalina and they agreed to work him in the schedule today for a follow up visit.   Oretha Caprice,  PT, MPT 12/13/2019, 1:26 PM  [facility] Physical Therapy [address] [place], [place], [address] Phone: [phone]   Fax:  [phone]

## 2019-12-15 ENCOUNTER — Telehealth: Payer: Self-pay | Admitting: Surgery

## 2019-12-15 NOTE — Telephone Encounter (Signed)
Patient called requesting a volume for MRI appt 12/16/19 at 12pm. Please send to [facility]. Patient is claustrophobic and need medication. Patient phone number is [phone].

## 2019-12-16 ENCOUNTER — Telehealth: Payer: Self-pay | Admitting: Orthopaedic Surgery

## 2019-12-16 ENCOUNTER — Other Ambulatory Visit: Payer: Self-pay

## 2019-12-16 ENCOUNTER — Ambulatory Visit (HOSPITAL_COMMUNITY)
Admission: RE | Admit: 2019-12-16 | Discharge: 2019-12-16 | Disposition: A | Discharge status: Home / Self Care | Payer: Medicare HMO | Source: Ambulatory Visit | Attending: Surgery | Admitting: Surgery

## 2019-12-16 DIAGNOSIS — M958 Other specified acquired deformities of musculoskeletal system: Secondary | ICD-10-CM | POA: Diagnosis not present

## 2019-12-16 MED ORDER — DIAZEPAM 5 MG PO TABS: ORAL_TABLET | ORAL | 0 refills | Status: DC

## 2019-12-16 NOTE — Telephone Encounter (Signed)
Called to pharmacy. Patient aware.

## 2019-12-16 NOTE — Telephone Encounter (Signed)
I called patient, appt made.

## 2019-12-16 NOTE — Telephone Encounter (Signed)
Patient called.   He said that he was told to call betsy once his MRI had been done.    Call back: [phone]

## 2019-12-20 ENCOUNTER — Encounter: Payer: Self-pay | Admitting: Orthopaedic Surgery

## 2019-12-20 ENCOUNTER — Ambulatory Visit: Payer: Medicare HMO | Admitting: Orthopaedic Surgery

## 2019-12-20 VITALS — Ht 75.0 in | Wt 388.0 lb

## 2019-12-20 DIAGNOSIS — M76822 Posterior tibial tendinitis, left leg: Secondary | ICD-10-CM

## 2019-12-20 DIAGNOSIS — M7752 Other enthesopathy of left foot: Secondary | ICD-10-CM | POA: Diagnosis not present

## 2019-12-20 DIAGNOSIS — M67322 Transient synovitis, left elbow: Secondary | ICD-10-CM

## 2019-12-20 MED ORDER — NITROGLYCERIN 0.2 MG/HR TD PT24: 0.2000 mg | MEDICATED_PATCH | Freq: Every day | TRANSDERMAL | Status: DC

## 2019-12-20 NOTE — Progress Notes (Signed)
Office Visit Note   Patient: Raymond Herrera           Date of Birth: Oct 01, 1955           MRN: [id number] Visit Date: 12/20/2019              Requested by: [name], MD [place],  [place] [address] PCP: [name], MD   Assessment & Plan: Visit Diagnoses:  1. Left ankle tendonitis   2. Transient synovitis of left elbow   3. Posterior tibial tendonitis, left     Plan: Elbow injection performed.  We will place him in a cam boot to rest his posterior tibial tendinopathy.  We discussed surgical options if he does not respond to conservative treatment.  He will work on dieting weight loss help with his lumbar symptoms.  Recheck 6 weeks.  Follow-Up Instructions: No follow-ups on file.   Orders:  Orders Placed This Encounter  Procedures  . Medium Joint Inj: L elbow   Meds ordered this encounter  Medications  . nitroGLYCERIN (NITRODUR - Dosed in mg/24 hr) patch 0.2 mg      Procedures: Medium Joint Inj: L elbow on 12/20/2019 9:01 AM Indications: pain Details: 22 G 1.5 in needle, anterolateral approach Medications: 1 mL bupivacaine 0.5 %; 40 mg methylPREDNISolone acetate 40 MG/ML; 0.5 mL lidocaine 1 % Outcome: tolerated well, no immediate complications Procedure, treatment alternatives, risks and benefits explained, specific risks discussed. Consent was given by the patient. Immediately prior to procedure a time out was called to verify the correct patient, procedure, equipment, support staff and site/side marked as required. Patient was prepped and draped in the usual sterile fashion.       Clinical Data: No additional findings.   Subjective: Chief Complaint  Patient presents with  . Left Ankle - Pain, Follow-up    MRI review  . Left Elbow - Pain    HPI 64 year old male returns with ongoing problems with the left ankle primarily medially.  MRI scan has been obtained and is available for review. MRI shows osteochondral lesion  without free fragment able to be located.  He also has significant posterior tibial tendinopathy corresponding with his present symptoms.  He has had problems with his left elbow he still plays golf weekly usually His choice but states his elbow is been painful he does not have full extension in either elbow he has noticed swelling left elbow.  No history of gout or other rheumatologic disease.  No numbness or tingling in his hand no associated neck pain.  He denies chills or fever. Review of Systems   Objective: Vital Signs: Ht 6\' 3"  (1.905 m)   Wt (!) 388 lb (176 kg)   BMI 48.50 kg/m   Physical Exam Constitutional:      Appearance: He is well-developed.  HENT:     Head: Normocephalic and atraumatic.  Eyes:     Pupils: Pupils are equal, round, and reactive to light.  Neck:     Thyroid: No thyromegaly.     Trachea: No tracheal deviation.  Cardiovascular:     Rate and Rhythm: Normal rate.  Pulmonary:     Effort: Pulmonary effort is normal.     Breath sounds: No wheezing.  Abdominal:     General: Bowel sounds are normal.     Palpations: Abdomen is soft.  Skin:    General: Skin is warm and dry.     Capillary Refill: Capillary refill  takes less than 2 seconds.  Neurological:     Mental Status: He is alert and oriented to person, place, and time.  Psychiatric:        Behavior: Behavior normal.        Thought Content: Thought content normal.        Judgment: Judgment normal.     Ortho Exam patient has tenderness over the left ankle posterior tibial tendon pain with resisted tendon function swelling in the sheath.  Mild swelling of the ankle joint no catching no crepitus with passive ankle range of motion peroneals are strong nontender.  Achilles tendons normal.  Left elbow lacks 10 degrees reaching full extension right and left.  Some prominence of the joint laterally adjacent the radial head full supination pronation lateral epicondyles normal.  Ulnar nerve medial epicondyle  normal.  Normal wrist flexion extension.  Radial tunnel nontender.  Specialty Comments:  No specialty comments available.  Imaging:CLINICAL DATA:  Ankle pain.  Osteochondral lesion seen on x-ray  EXAM: MRI OF THE LEFT ANKLE WITHOUT CONTRAST  TECHNIQUE: Multiplanar, multisequence MR imaging of the ankle was performed. No intravenous contrast was administered.  COMPARISON:  X-ray 11/13/2019  FINDINGS: TENDONS  Peroneal: Intact peroneus longus and peroneus brevis tendons. Mild tenosynovial fluid.  Posteromedial: Marked tendinosis with high-grade partial-thickness tearing of the tibialis posterior tendon. Flexor hallucis longus and flexor digitorum longus tendons are intact. Mild tenosynovial fluid associated with the posteromedial ankle tendons.  Anterior: Intact tibialis anterior, extensor hallucis longus and extensor digitorum longus tendons.  Achilles: Intact.  Plantar Fascia: Intact.  LIGAMENTS  Lateral: The anterior and posterior tibiofibular ligaments are intact. The anterior and posterior talofibular ligaments are intact. Intact calcaneofibular ligament.  Medial: High-grade sprain and partial tear of the superomedial portion of the spring ligament complex. The plantar portions of the ligament are not well seen and likely torn. Intact deltoid ligament.  CARTILAGE  Ankle Joint: Focal osteochondral lesion of the posteromedial talar shoulder measuring approximately 8 x 6 x 8 mm (series 5, image 11; series 9, image 21). Fragment is displaced from the donor site. A definitive fragment is not visualized. Tibiotalar joint cartilage is otherwise intact. No tibiotalar joint effusion.  Subtalar Joints/Sinus Tarsi: No joint effusion or chondral defect. Preservation of the anatomic fat within the sinus tarsi. Small ganglion cyst emanates from the roof of the sinus tarsi (series 8, images 17-21).  Bones: Mild-to-moderate arthropathy throughout the  midfoot, most severe at the fourth TMT joint. No acute fracture. No dislocation.  Other: Nonspecific circumferential subcutaneous edema. No soft tissue fluid collection.  IMPRESSION: 1. 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 Fragment is displaced from the donor site. A definitive fragment is not visualized. 2. Marked tendinosis with high-grade partial-thickness tearing of the tibialis posterior tendon. 3. High-grade sprain and partial tear of the superomedial portion of the spring ligament complex. The plantar portions of the ligament are not well seen and likely torn. 4. Mild-to-moderate arthropathy throughout the midfoot, most severe at the fourth TMT joint.   Electronically Signed   By: Davina Poke D.O.   On: 12/16/2019 13:06     PMFS History: Patient Active Problem List   Diagnosis Date Noted  . Transient synovitis of left elbow 12/21/2019  . Posterior tibial tendonitis, left 11/14/2019  . Colon cancer screening   . Benign neoplasm of transverse colon   . HYPOGONADISM 06/12/2010  . OBSTRUCTIVE SLEEP APNEA 05/07/2010  . HYPERLIPIDEMIA  10/24/2009  . ABDOMINAL PAIN, EPIGASTRIC 09/17/2009  . Lumbago 12/05/2008  . GERD 11/15/2007  . PALPITATIONS 11/15/2007  . OSTEOARTHRITIS 07/09/2007  . LUMBAR STRAIN 07/09/2007  . ANXIETY 02/01/2007  . ALLERGIC RHINITIS 02/01/2007  . [place], [place] 02/01/2007  . CH[place]EN[place], HX OF 02/01/2007   Past Medical History:  Diagnosis Date  . Allergy   . Anxiety   . Arthritis    Both Knees  . Chronic low back pain    had seen Dr. Rennis Harding, the Workers Comp case has been settled  . Depression   . Family history of adverse reaction to anesthesia    sister ponv  . GERD (gastroesophageal reflux disease)   . Hyperlipidemia   . Hypogonadism male    sees Dr. Risa Grill   . Plantar fasciitis    hx of  . 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 Risa Grill   .  Sleep apnea, obstructive    sees Dr. Gwenette Greet, uses CPAP  setting of 12    Family History  Problem Relation Age of Onset  . Stroke Father   . Anesthesia problems Neg Hx     Past Surgical History:  Procedure Laterality Date  . APPENDECTOMY    . BACK SURGERY  2016   Laminectomy  . CARPAL TUNNEL RELEASE Left 02-14-14   per Dr. [name]   . COLONOSCOPY  2008   clear, repeat in 10 yrs   . COLONOSCOPY WITH PROPOFOL N/A 11/06/2016   Procedure: COLONOSCOPY WITH PROPOFOL;  Surgeon: Milus Banister, MD;  Location: [facility];  Service: Endoscopy;  Laterality: N/A;  . JOINT REPLACEMENT  [id number]   bilateral knees, Dr. Percell Miller   . RADIOFREQUENCY ABLATION  02/2011   x 2  . Scrotal Abcess     I/D - healed  . TONSILLECTOMY    . TOTAL KNEE REVISION  10/01/2011   Procedure: TOTAL KNEE REVISION;  Surgeon: Ninetta Lights, MD;  Location: [facility];  Service: Orthopedics;  Laterality: Left;  left total knee revision, tibial and patellar components   Social History   Occupational History  . Not on file  Tobacco Use  . Smoking status: Never Smoker  . Smokeless tobacco: Never Used  Vaping Use  . Vaping Use: Never used  Substance and Sexual Activity  . Alcohol use: Yes    Alcohol/week: 0.0 standard drinks    Comment: occ  . Drug use: No  . Sexual activity: Not on file

## 2019-12-21 ENCOUNTER — Ambulatory Visit: Payer: Medicare HMO | Admitting: Surgery

## 2019-12-21 DIAGNOSIS — M67322 Transient synovitis, left elbow: Secondary | ICD-10-CM | POA: Insufficient documentation

## 2019-12-21 MED ORDER — METHYLPREDNISOLONE ACETATE 40 MG/ML IJ SUSP
40.0000 mg | INTRAMUSCULAR | Status: AC | PRN
  Administered 2019-12-20: 09:00:00 40 mg via INTRA_ARTICULAR

## 2019-12-21 MED ORDER — BUPIVACAINE HCL 0.5 % IJ SOLN
1.0000 mL | INTRAMUSCULAR | Status: AC | PRN
  Administered 2019-12-20: 09:00:00 1 mL via INTRA_ARTICULAR

## 2019-12-21 MED ORDER — LIDOCAINE HCL 1 % IJ SOLN
0.5000 mL | INTRAMUSCULAR | Status: AC | PRN
  Administered 2019-12-20: 09:00:00 .5 mL

## 2019-12-29 ENCOUNTER — Other Ambulatory Visit: Payer: Self-pay | Admitting: Orthopaedic Surgery

## 2019-12-29 ENCOUNTER — Telehealth: Payer: Self-pay | Admitting: Orthopaedic Surgery

## 2019-12-29 NOTE — Telephone Encounter (Signed)
Looks like it was entered as clinic administered drug? Can you submit?

## 2019-12-29 NOTE — Telephone Encounter (Signed)
Patient called  Advised the nitroglycerin patch was not sent to the [facility]. Patient asked which pharmacy was the Rx sent? The number to contact patient is [phone]

## 2019-12-30 ENCOUNTER — Other Ambulatory Visit: Payer: Self-pay | Admitting: Surgery

## 2019-12-30 MED ORDER — NITROGLYCERIN 0.2 MG/HR TD PT24: 0.2000 mg | MEDICATED_PATCH | Freq: Every day | TRANSDERMAL | 1 refills | Status: DC

## 2019-12-30 NOTE — Telephone Encounter (Signed)
sent 

## 2019-12-30 NOTE — Telephone Encounter (Signed)
I never write for nitroglycerin patches.  Not sure where that came from.

## 2019-12-30 NOTE — Telephone Encounter (Signed)
He saw Dr Lorin Mercy.

## 2019-12-30 NOTE — Telephone Encounter (Signed)
Patient aware rx sent to pharmacy.  

## 2020-01-18 ENCOUNTER — Ambulatory Visit: Payer: Medicare HMO | Admitting: Pulmonary Disease

## 2020-01-18 ENCOUNTER — Other Ambulatory Visit: Payer: Self-pay

## 2020-01-18 ENCOUNTER — Encounter: Payer: Self-pay | Admitting: Pulmonary Disease

## 2020-01-18 VITALS — BP 158/80 | HR 92 | Temp 97.3°F | Ht 75.0 in | Wt 394.6 lb

## 2020-01-18 DIAGNOSIS — Z9989 Dependence on other enabling machines and devices: Secondary | ICD-10-CM | POA: Diagnosis not present

## 2020-01-18 DIAGNOSIS — G4733 Obstructive sleep apnea (adult) (pediatric): Secondary | ICD-10-CM | POA: Diagnosis not present

## 2020-01-18 NOTE — Progress Notes (Signed)
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 celecoxib (CELEBREX) 200 MG capsule Take 1 capsule (200 mg total) by mouth 2 (two) times daily. (Patient taking differently: Take 200 mg by mouth 2 (two) times daily as needed for mild pain. )  . diazepam (VALIUM) 5 MG tablet Take as directed prior to procedure.  Marland Kitchen EPINEPHrine (EPIPEN 2-PAK) 0.3 mg/0.3 mL SOAJ Inject 0.3 mLs (0.3 mg total) into the muscle once.  Marland Kitchen esomeprazole (NEXIUM) 40 MG capsule take twice a day (Patient taking differently: Take 40 mg by mouth daily as needed (ACID REFLUX). )  . fluticasone (FLONASE) 50 MCG/ACT nasal spray USE 2 SPRAYS IN EACH NOSTRIL ONCE DAILY (Patient taking differently: USE 1 SPRAYS IN EACH NOSTRIL ONCE DAILY AS NEEDED FOR ALLERGIES)  . LORazepam (ATIVAN) 2 MG tablet   . methocarbamol (ROBAXIN) 500 MG tablet Take 1 tablet (500 mg total) by mouth 4 (four) times daily. (Patient taking differently: Take 500 mg by mouth 2 (two) times daily as needed for muscle spasms. As needed)  . nitroGLYCERIN (NITRODUR - DOSED IN MG/24 HR)  0.2 mg/hr patch Place 1 patch (0.2 mg total) onto the skin daily.  . Oxycodone HCl 10 MG TABS Take 1 tablet (10 mg total) by mouth every 4 (four) hours as needed (pain).  . predniSONE (DELTASONE) 5 MG tablet Take by mouth See admin instructions. TAPERED DOSE 6,5,4,3,2,1   . Testosterone Cypionate 200 MG/ML KIT Inject 100 mg into the muscle every Friday.  . [DISCONTINUED] oxyCODONE (OXYCONTIN) 15 mg 12 hr tablet Take 15 mg by mouth every 12 (twelve) hours.   Facility-Administered Encounter Medications as of 01/18/2020  Medication  . nitroGLYCERIN (NITRODUR - Dosed in mg/24 hr) patch 0.2 mg    Allergies as of 01/18/2020 - Review Complete 01/18/2020  Allergen Reaction Noted  . Bee venom Anaphylaxis and Swelling 10/01/2011  . Tramadol-acetaminophen Anaphylaxis and Itching 10/08/2006  . Lyrica [pregabalin]  04/24/2015    Past Medical History:  Diagnosis Date  . Allergy   . Anxiety   . Arthritis    Both Knees  . Chronic low back pain    had seen Dr. Rennis Harding, the Workers Comp case has been settled  . Depression   . Family history of adverse reaction to anesthesia    sister ponv  . GERD (gastroesophageal reflux disease)   . Hyperlipidemia   . Hypogonadism male    sees Dr. Risa Grill   . Plantar fasciitis    hx of  . Scrotal abscess  06-06-10   MRSA, lanced by Dr. Risa Grill   . Sleep apnea, obstructive    sees Dr. Gwenette Greet, uses CPAP  setting of 12    Past Surgical History:  Procedure Laterality Date  . APPENDECTOMY    . BACK SURGERY  2016   Laminectomy  . CARPAL TUNNEL RELEASE Left 02-14-14   per Dr. [name]   . COLONOSCOPY  2008   clear, repeat in 10 yrs   . COLONOSCOPY WITH PROPOFOL N/A 11/06/2016   Procedure: COLONOSCOPY WITH PROPOFOL;  Surgeon: Milus Banister, MD;  Location: [facility];  Service: Endoscopy;  Laterality: N/A;  . JOINT REPLACEMENT  [id number]   bilateral knees, Dr. Percell Miller   . RADIOFREQUENCY ABLATION  02/2011   x 2  . Scrotal Abcess     I/D - healed  .  TONSILLECTOMY    . TOTAL KNEE REVISION  10/01/2011   Procedure: TOTAL KNEE REVISION;  Surgeon: Ninetta Lights, MD;  Location: [facility];  Service: Orthopedics;  Laterality: Left;  left total knee revision, tibial and patellar components    Family History  Problem Relation Age of Onset  . Stroke Father   . Anesthesia problems Neg Hx     Social History   Socioeconomic History  . Marital status: Married    Spouse name: Not on file  . Number of children: Not on file  . Years of education: Not on file  . Highest education level: Not on file  Occupational History  . Not on file  Tobacco Use  . Smoking status: Never Smoker  . Smokeless tobacco: Never Used  Vaping Use  . Vaping Use: Never used  Substance and Sexual Activity  . Alcohol use: Yes    Alcohol/week: 0.0 standard drinks    Comment: occ  . Drug use: No  . Sexual activity: Not on file  Other Topics Concern  . Not on file  Social History Narrative  . Not on file   Social Determinants of Health   Financial Resource Strain:   . Difficulty of Paying Living Expenses: Not on file  Food Insecurity:   . Worried About Charity fundraiser in the Last Year: Not on file  . Ran Out of Food in the Last Year: Not on file  Transportation Needs:   . Lack of Transportation (Medical): Not on file  . Lack of Transportation (Non-Medical): Not on file  Physical Activity:   . Days of Exercise per Week: Not on file  . Minutes of Exercise per Session: Not on file  Stress:   . Feeling of Stress : Not on file  Social Connections:   . Frequency of Communication with Friends and Family: Not on file  . Frequency of Social Gatherings with Friends and Family: Not on file  . Attends Religious Services: Not on file  . Active Member of Clubs or Organizations: Not on file  . Attends Archivist Meetings: Not on file  . Marital Status: Not on file  Intimate Partner Violence:   . Fear of Current or Ex-Partner: Not on file  . Emotionally  Abused: Not on file  . Physically Abused: Not on file  . Sexually Abused: Not on file    Review of Systems  Respiratory: Positive for apnea.   Psychiatric/Behavioral: Positive for sleep disturbance.    Vitals:   01/18/20 0954  BP: (!) 158/80  Pulse: 92  Temp: (!) 97.3 F (36.3 C)  SpO2: 92%     Physical Exam  Constitutional:      Appearance: He is obese.  HENT:     Head: Normocephalic.     Mouth/Throat:     Comments: Mallampati 4, crowded oropharynx Neck:     Comments: Neck is thick Cardiovascular:     Rate and Rhythm: Normal rate.     Pulses: Normal pulses.     Heart sounds: Normal heart sounds. No murmur heard.  No friction rub.  Pulmonary:     Effort: Pulmonary effort is normal. No respiratory distress.     Breath sounds: No stridor. No wheezing or rhonchi.  Musculoskeletal:     Cervical back: No rigidity or tenderness.  Neurological:     Mental Status: He is alert.   Compliance data not available   Data Reviewed: Patient remains on CPAP of 15  Compliance reveals 97% compliance AHI of 0.5 No significant leaks  Assessment:  Severe obstructive sleep apnea -Continues to tolerate set pressure of 15 -Compliance today reveals adequate treatment  The plan during the last visit was to switch to an auto titrating setting, to obtain a chinstrap both of which were not facilitated by the DME  Appears to be functioning well at the present time  We'll not make any changes at present  Plan/Recommendations:  Continue current CPAP pressures  No need to add a chinstrap at present as seems to be doing very well  We'll follow-up in 6 months  Plan previously had been to change to auto titrate pressures of 5-20 with addition of a chinstrap, this may need to be done if he is not doing well  Follow-up in 6 months and if he is doing well at that time then we'll see him on a yearly basis  Sherrilyn Rist MD [facility] 01/18/2020, 10:14  AM  CC: Corrington, Kip A, MD

## 2020-01-18 NOTE — Patient Instructions (Signed)
Your CPAP seems to be working well With no significant symptoms of dryness at present-I do not believe we need to make any changes  Call us if you're having any concerns  Continue using CPAP on a regular basis  I will see you in 6 months

## 2020-04-18 ENCOUNTER — Encounter: Payer: Self-pay | Admitting: Internal Medicine

## 2020-04-18 ENCOUNTER — Ambulatory Visit: Payer: Medicare HMO | Admitting: Internal Medicine

## 2020-04-18 ENCOUNTER — Other Ambulatory Visit: Payer: Self-pay

## 2020-04-18 VITALS — BP 132/70 | HR 95 | Ht 75.0 in | Wt 396.0 lb

## 2020-04-18 DIAGNOSIS — R079 Chest pain, unspecified: Secondary | ICD-10-CM

## 2020-04-18 DIAGNOSIS — R072 Precordial pain: Secondary | ICD-10-CM

## 2020-04-18 MED ORDER — METOPROLOL TARTRATE 100 MG PO TABS: ORAL_TABLET | ORAL | 0 refills | Status: DC

## 2020-04-18 NOTE — Patient Instructions (Addendum)
Medication Instructions:  Your physician recommends that you continue on your current medications as directed. Please refer to the Current Medication list given to you today.  *If you need a refill on your cardiac medications before your next appointment, please call your pharmacy*   Lab Work: None today  Your physician recommends that you return for lab work (BMET) prior to your Cardiac CT  If you have labs (blood work) drawn today and your tests are completely normal, you will receive your results only by: . MyChart Message (if you have MyChart) OR . A paper copy in the mail If you have any lab test that is abnormal or we need to change your treatment, we will call you to review the results.   Testing/Procedures: Your physician has requested that you have cardiac CT.   Follow-Up: AS NEEDED  Other Instructions Your cardiac CT will be scheduled at one of the below locations:   [facility] [address] [place], [place] [address] [phone]  OR  [facility] [address] [place], [place] [address] [phone]  If scheduled at [facility], please arrive at the North Tower main entrance of [facility] 30 minutes prior to test start time. Proceed to the [facility] Department (first floor) to check-in and test prep.  If scheduled at [facility], please arrive 15 mins early for check-in and test prep.  Please follow these instructions carefully (unless otherwise directed):  Hold all erectile dysfunction medications at least 3 days (72 hrs) prior to test.  On the Night Before the Test: . Be sure to Drink plenty of water. . Do not consume any caffeinated/decaffeinated beverages or chocolate 12 hours prior to your test. . Do not take any antihistamines 12 hours prior to your test.  On the Day of the Test: . Drink plenty of water. Do not drink any water within  one hour of the test. . Do not eat any food 4 hours prior to the test. . You may take your regular medications prior to the test.  . Take metoprolol (Lopressor) 100 MG two hours prior to test.       After the Test: . Drink plenty of water. . After receiving IV contrast, you may experience a mild flushed feeling. This is normal. . On occasion, you may experience a mild rash up to 24 hours after the test. This is not dangerous. If this occurs, you can take Benadryl 25 mg and increase your fluid intake. . If you experience trouble breathing, this can be serious. If it is severe call 911 IMMEDIATELY. If it is mild, please call our office.   Once we have confirmed authorization from your insurance company, we will call you to set up a date and time for your test. Based on how quickly your insurance processes prior authorizations requests, please allow up to 4 weeks to be contacted for scheduling your Cardiac CT appointment. Be advised that routine Cardiac CT appointments could be scheduled as many as 8 weeks after your provider has ordered it.  For non-scheduling related questions, please contact the cardiac imaging nurse navigator should you have any questions/concerns: Sara Wallace, Cardiac Imaging Nurse Navigator Merle Tai, Interim Cardiac Imaging Nurse Navigator  [facility] Direct Office Dial: [phone]   For scheduling needs, including cancellations and rescheduling, please call Brittany, [phone] (temporary number).     

## 2020-04-18 NOTE — Progress Notes (Signed)
ELECTROPHYSIOLOGY OFFICE NOTE  Patient ID: Raymond Herrera, MRN: [id number], DOB/AGE: 25-Jul-1955 64 y.o. Admit date: (Not on file) Date of Consult: 04/18/2020  Primary Physician: [name], MD Primary Cardiologist:      DEMETRION WESBY is a 64 y.o. male who is being seen today for the evaluation of abnormal electrocardiogram at his request.  I had taken care of his daughter and he called me while I was out of town.  HPI Raymond Herrera is a 64 y.o. male seen at his request because of concerns about abnormal ECG   DATE TEST EF   12/16 Myoview  67 % No ischemia        Date Cr K TSH Hgb  10/21 (CE) 0.86 3.8 3.58 16.6          No exertional chest pain.  No family history.  Cholesterol levels are as noted belo does not smoke  No edema.  Severe issues with back pain currently disabled.  Uses alcohol liberally in part to mitigate his back pain (admits to an average 9 beers a night)   Past Medical History:  Diagnosis Date  . Allergy   . Anxiety   . Arthritis    Both Knees  . Chronic low back pain    had seen Dr. Rennis Harding, the Workers Comp case has been settled  . Depression   . Family history of adverse reaction to anesthesia    sister ponv  . GERD (gastroesophageal reflux disease)   . Hyperlipidemia   . Hypogonadism male    sees Dr. Risa Grill   . Plantar fasciitis    hx of  . 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 Risa Grill   . Sleep apnea, obstructive    sees Dr. Gwenette Greet, uses CPAP  setting of 12      Surgical History:  Past Surgical History:  Procedure Laterality Date  . APPENDECTOMY    . BACK SURGERY  2016   Laminectomy  . CARPAL TUNNEL RELEASE Left 02-14-14   per Dr. [name]   . COLONOSCOPY  2008   clear, repeat in 10 yrs   . COLONOSCOPY WITH PROPOFOL N/A 11/06/2016   Procedure: COLONOSCOPY WITH PROPOFOL;  Surgeon: Milus Banister, MD;  Location: [facility];  Service: Endoscopy;  Laterality: N/A;  . JOINT REPLACEMENT   [id number]   bilateral knees, Dr. Percell Miller   . RADIOFREQUENCY ABLATION  02/2011   x 2  . Scrotal Abcess     I/D - healed  . TONSILLECTOMY    . TOTAL KNEE REVISION  10/01/2011   Procedure: TOTAL KNEE REVISION;  Surgeon: Ninetta Lights, MD;  Location: [facility];  Service: Orthopedics;  Laterality: Left;  left total knee revision, tibial and patellar components     Home Meds: Current Meds  Medication Sig  . celecoxib (CELEBREX) 200 MG capsule Take 1 capsule (200 mg total) by mouth 2 (two) times daily.  . diazepam (VALIUM) 5 MG tablet Take as directed prior to procedure.  Marland Kitchen EPINEPHrine (EPIPEN 2-PAK) 0.3 mg/0.3 mL SOAJ Inject 0.3 mLs (0.3 mg total) into the muscle once.  Marland Kitchen esomeprazole (NEXIUM) 40 MG capsule take twice a day  . fluticasone (FLONASE) 50 MCG/ACT nasal spray USE 2 SPRAYS IN EACH NOSTRIL ONCE DAILY  . LORazepam (ATIVAN) 2 MG tablet   . methocarbamol (ROBAXIN) 500 MG tablet Take 1 tablet (500 mg total) by mouth 4 (four) times daily.  . nitroGLYCERIN (NITRODUR -  DOSED IN MG/24 HR) 0.2 mg/hr patch Place 1 patch (0.2 mg total) onto the skin daily.  . Oxycodone HCl 10 MG TABS Take 1 tablet (10 mg total) by mouth every 4 (four) hours as needed (pain).  . predniSONE (DELTASONE) 5 MG tablet Take by mouth See admin instructions. TAPERED DOSE 6,5,4,3,2,1   . Testosterone Cypionate 200 MG/ML KIT Inject 100 mg into the muscle every Friday.   Current Facility-Administered Medications for the 04/18/20 encounter (Office Visit) with Deboraha Sprang, MD  Medication  . nitroGLYCERIN (NITRODUR - Dosed in mg/24 hr) patch 0.2 mg    Allergies:  Allergies  Allergen Reactions  . Bee Venom Anaphylaxis and Swelling  . Tramadol-Acetaminophen Anaphylaxis and Itching    REACTION: anaphylaxis, no trouble with Tylenol  . Lyrica [Pregabalin]     Arm swelling    Social History   Socioeconomic History  . Marital status: Married    Spouse name: Not on file  . Number of children: Not on file  . Years of  education: Not on file  . Highest education level: Not on file  Occupational History  . Not on file  Tobacco Use  . Smoking status: Never Smoker  . Smokeless tobacco: Never Used  Vaping Use  . Vaping Use: Never used  Substance and Sexual Activity  . Alcohol use: Yes    Alcohol/week: 0.0 standard drinks    Comment: occ  . Drug use: No  . Sexual activity: Not on file  Other Topics Concern  . Not on file  Social History Narrative  . Not on file   Social Determinants of Health   Financial Resource Strain:   . Difficulty of Paying Living Expenses: Not on file  Food Insecurity:   . Worried About Charity fundraiser in the Last Year: Not on file  . Ran Out of Food in the Last Year: Not on file  Transportation Needs:   . Lack of Transportation (Medical): Not on file  . Lack of Transportation (Non-Medical): Not on file  Physical Activity:   . Days of Exercise per Week: Not on file  . Minutes of Exercise per Session: Not on file  Stress:   . Feeling of Stress : Not on file  Social Connections:   . Frequency of Communication with Friends and Family: Not on file  . Frequency of Social Gatherings with Friends and Family: Not on file  . Attends Religious Services: Not on file  . Active Member of Clubs or Organizations: Not on file  . Attends Archivist Meetings: Not on file  . Marital Status: Not on file  Intimate Partner Violence:   . Fear of Current or Ex-Partner: Not on file  . Emotionally Abused: Not on file  . Physically Abused: Not on file  . Sexually Abused: Not on file     Family History  Problem Relation Age of Onset  . Stroke Father   . Anesthesia problems Neg Hx      ROS:  Please see the history of present illness.     All other systems reviewed and negative.    Physical Exam: Blood pressure 132/70, pulse 95, height '6\' 3"'  (1.905 m), weight (!) 396 lb (179.6 kg), SpO2 95 %. General: Morbidly obese  Head: Normocephalic, atraumatic, sclera non-icteric,  no xanthomas, nares are without discharge. EENT: normal  Lymph Nodes:  none Neck: Negative for carotid bruits. JVD not elevated. Back:without scoliosis kyphosis Lungs: Clear bilaterally to auscultation without wheezes, rales, or  rhonchi. Breathing is unlabored. Heart: RRR with S1 S2. No  murmur . No rubs, or gallops appreciated. Abdomen: Soft, non-tender, non-distended with normoactive bowel sounds. No hepatomegaly. No rebound/guarding. No obvious abdominal masses. Msk:  Strength and tone appear normal for age. Extremities: No clubbing or cyanosis. No  edema.  Distal pedal pulses are 2+ and equal bilaterally. Skin: Warm and Dry Neuro: Alert and oriented X 3. CN III-XII intact Grossly normal sensory and motor function . Psych:  Responds to questions appropriately with a normal affect.      Labs: Cardiac Enzymes No results for input(s): CKTOTAL, CKMB, TROPONINI in the last 72 hours. CBC Lab Results  Component Value Date   WBC 7.1 11/24/2014   HGB 16.6 11/24/2014   HCT 48.9 11/24/2014   MCV 91.0 11/24/2014   PLT 188.0 11/24/2014   PROTIME: No results for input(s): LABPROT, INR in the last 72 hours. Chemistry No results for input(s): NA, K, CL, CO2, BUN, CREATININE, CALCIUM, PROT, BILITOT, ALKPHOS, ALT, AST, GLUCOSE in the last 168 hours.  Invalid input(s): LABALBU Lipids Lab Results  Component Value Date   CHOL 182 11/24/2014   HDL 34.00 (L) 11/24/2014   LDLCALC 124 (H) 12/15/2012   TRIG 239.0 (H) 11/24/2014   BNP No results found for: PROBNP Thyroid Function Tests: No results for input(s): TSH, T4TOTAL, T3FREE, THYROIDAB in the last 72 hours.  Invalid input(s): FREET3 Miscellaneous No results found for: DDIMER  Radiology/Studies:  No results found.  EKG: Sinus at 95 Intervals 15/11/34 Axis -69  Electrocardiograms from 2016 and 2014 were reviewed and are essentially unchanged   Assessment and Plan:  Abnormal ECG  Chest pain-atypical  Alcohol  intake-exuberant  Back pain-disabled   The patient was referred because of an abnormal ECG.  Reviewing his ECGs back to 2014 I do not see any significant difference.  There is a Myoview scan in 2016 which showed no ischemia no infarction.  His cholesterol levels from 11/21 were reviewed.  These are associate with a 10.5% 10-year risk.  Hence, we will undertake calcium imaging.  With his atypical chest pain however, have a little bit reluctant to use start with a calcium score so we will undergo CTA which will include a calcium score.  This will inform therapeutic issues both related to further testing as well as the possible benefit of statins.  Discussed also the significant alcohol intake.  Have encouraged him to work on decreasing that in the context of his significant stress associated with his disability and back pain.  Encouraged weight loss.     Virl Axe

## 2020-05-01 ENCOUNTER — Telehealth: Payer: Self-pay | Admitting: Internal Medicine

## 2020-05-01 NOTE — Telephone Encounter (Signed)
Pt and ins comp called in to check the status of the CT scan that was ordered by Dr Caryl Comes

## 2020-05-03 NOTE — Telephone Encounter (Signed)
Pt has been scheduled for 05/14/2020 and is aware of appointment date and time according to appointment note.

## 2020-05-10 ENCOUNTER — Telehealth (HOSPITAL_COMMUNITY): Payer: Self-pay | Admitting: Emergency Medicine

## 2020-05-10 NOTE — Telephone Encounter (Signed)
Reaching out to patient to offer assistance regarding upcoming cardiac imaging study; pt verbalizes understanding of appt date/time, parking situation and where to check in, pre-test NPO status and medications ordered, and verified current allergies; name and call back number provided for further questions should they arise Marchia Bond RN [facility] [phone] office [phone] cell   Pt with claustro - states he might have something for anxiety to take prior to scan, I suggested he have a driver for appt and he said that wasn't going to work. He said he would tough it out.    Pt to take metoprolol tartrate 2 hr prior to scan Clarise Cruz

## 2020-05-14 ENCOUNTER — Other Ambulatory Visit: Payer: Self-pay

## 2020-05-14 ENCOUNTER — Ambulatory Visit (HOSPITAL_COMMUNITY)
Admission: RE | Admit: 2020-05-14 | Discharge: 2020-05-14 | Disposition: A | Discharge status: Home / Self Care | Payer: Medicare HMO | Source: Ambulatory Visit | Attending: Internal Medicine | Admitting: Internal Medicine

## 2020-05-14 ENCOUNTER — Encounter (HOSPITAL_COMMUNITY): Payer: Self-pay

## 2020-05-14 ENCOUNTER — Telehealth: Payer: Self-pay | Admitting: Internal Medicine

## 2020-05-14 DIAGNOSIS — R072 Precordial pain: Secondary | ICD-10-CM

## 2020-05-14 DIAGNOSIS — Z006 Encounter for examination for normal comparison and control in clinical research program: Secondary | ICD-10-CM

## 2020-05-14 LAB — POCT I-STAT CREATININE: Creatinine, Ser: 1 mg/dL (ref 0.61–1.24)

## 2020-05-14 MED ORDER — NITROGLYCERIN 0.4 MG SL SUBL
SUBLINGUAL_TABLET | SUBLINGUAL | Status: AC
  Filled 2020-05-14: qty 2

## 2020-05-14 MED ORDER — METOPROLOL TARTRATE 5 MG/5ML IV SOLN
INTRAVENOUS | Status: AC
  Filled 2020-05-14: qty 5

## 2020-05-14 MED ORDER — METOPROLOL TARTRATE 5 MG/5ML IV SOLN
5.0000 mg | INTRAVENOUS | Status: DC | PRN
  Administered 2020-05-14 (×3): 5 mg via INTRAVENOUS

## 2020-05-14 MED ORDER — METOPROLOL TARTRATE 5 MG/5ML IV SOLN
INTRAVENOUS | Status: AC
  Filled 2020-05-14: qty 10

## 2020-05-14 MED ORDER — NITROGLYCERIN 0.4 MG SL SUBL: 0.8000 mg | SUBLINGUAL_TABLET | Freq: Once | SUBLINGUAL | Status: DC

## 2020-05-14 NOTE — Telephone Encounter (Signed)
error 

## 2020-05-14 NOTE — Telephone Encounter (Signed)
Spoke with pt who reports pt was unable to complete Cardiac CT d/t being unable to get pt's HR below 70.  Pt states he feels if study needs to be repeated he may benefit from some valium to help him relax,  Pt also reports he was enrolled in CAD FEM research study and wants to know if Cardiac CT is still necessary.  Phone number for research RN is [phone].  Pt advised will forward information to Dr Caryl Comes.  Pt verbalizes understanding and thanked Therapist, sports for call.

## 2020-05-14 NOTE — Research (Signed)
IDENTIFY Informed Consent   Subject Name: Raymond Herrera  Subject met inclusion and exclusion criteria.  The informed consent form, study requirements and expectations were reviewed with the subject and questions and concerns were addressed prior to the signing of the consent form.  The subject verbalized understanding of the trial requirements.  The subject agreed to participate in the IDENTIFY trial and signed the informed consent at 1112 on 20/DEC/2021.  The informed consent was obtained prior to performance of any protocol-specific procedures for the subject.  A copy of the signed informed consent was given to the subject and a copy was placed in the subject's medical record.   [name]

## 2020-05-14 NOTE — Telephone Encounter (Signed)
New Message  Pt is calling about his CT that was scheduled today  He says they gave him additional medication to slow his heart rate down and they still coudnt get it before 70 so they canceled the appt.  Pt would like to discuss with Dr Caryl Comes   Please call

## 2020-05-21 NOTE — Telephone Encounter (Signed)
Patient calling back. He states his CT is 05/23/2021 and is following up on what to do about his HR.

## 2020-05-22 NOTE — Telephone Encounter (Signed)
Spoke with pt and advised Dr Graciela Husbands is out of town until the end of next week and will discuss with him upon his return reduction of heart rate and CT scan.  Pt reports CT scan scheduled for 05/23/2020 has been canceled.  Pt verbalizes understanding and thanked Charity fundraiser for the call.

## 2020-05-23 ENCOUNTER — Ambulatory Visit (HOSPITAL_COMMUNITY): Payer: Medicare HMO

## 2020-05-29 ENCOUNTER — Ambulatory Visit: Payer: Medicare HMO | Admitting: Orthopaedic Surgery

## 2020-05-29 ENCOUNTER — Encounter: Payer: Self-pay | Admitting: Orthopaedic Surgery

## 2020-05-29 ENCOUNTER — Ambulatory Visit (INDEPENDENT_AMBULATORY_CARE_PROVIDER_SITE_OTHER): Payer: Medicare HMO

## 2020-05-29 VITALS — BP 151/93 | HR 87 | Ht 75.0 in | Wt 380.0 lb

## 2020-05-29 DIAGNOSIS — M79672 Pain in left foot: Secondary | ICD-10-CM | POA: Diagnosis not present

## 2020-05-29 DIAGNOSIS — M25572 Pain in left ankle and joints of left foot: Secondary | ICD-10-CM

## 2020-05-29 NOTE — Progress Notes (Signed)
Office Visit Note   Patient: Raymond Herrera           Date of Birth: 21-Mar-1956           MRN: [id number] Visit Date: 05/29/2020              Requested by: Vivien Presto, MD [phone] [address] [place],  [place] [address] PCP: Vivien Presto, MD   Assessment & Plan: Visit Diagnoses:  1. Pain in left ankle and joints of left foot   2. Pain in left foot     Plan: We reviewed the MRI scan as well as new radiographs which show no acute changes.  He likely had a loose body that locked his ankle and with manipulation resolution.  He needs to continue to work on gradual weight loss.  He is doing exercises in the pool and can add some additional swimming after his pool exercise class.  If he has repetitive walking he can return.  Follow-Up Instructions: No follow-ups on file.   Orders:  Orders Placed This Encounter  Procedures  . XR Ankle Complete Left  . XR Foot Complete Left   No orders of the defined types were placed in this encounter.     Procedures: No procedures performed   Clinical Data: No additional findings.   Subjective: Chief Complaint  Patient presents with  . Left Ankle - Pain  . Left Foot - Pain    HPI 65 year old male returns had acute pain on Friday he was in a grocery store his ankle locked up he could not stand had to have someone help him to his truck and could not weight-bear on it.  He continue to work on some range of motion exercises and states initially pain was extremely sharp midline anterior aspect of the ankle and then with continue moving of his ankle at home he felt a crack or pop and suddenly pain significantly improved.  After that he was able to ambulate.  He been using his Swede-O.  He is not able to use the cam boot since it tends to bother his back.  Patient is on chronic pain medication.  Review of Systems reviewed updated unchanged from past office visit.   Objective: Vital Signs: BP (!) 151/93   Pulse 87   Ht 6\' 3"   (1.905 m)   Wt (!) 380 lb (172.4 kg)   BMI 47.50 kg/m   Physical Exam Constitutional:      Appearance: He is well-developed and well-nourished.  HENT:     Head: Normocephalic and atraumatic.  Eyes:     Extraocular Movements: EOM normal.     Pupils: Pupils are equal, round, and reactive to light.  Neck:     Thyroid: No thyromegaly.     Trachea: No tracheal deviation.  Cardiovascular:     Rate and Rhythm: Normal rate.  Pulmonary:     Effort: Pulmonary effort is normal.     Breath sounds: No wheezing.  Abdominal:     General: Bowel sounds are normal.     Palpations: Abdomen is soft.  Skin:    General: Skin is warm and dry.     Capillary Refill: Capillary refill takes less than 2 seconds.  Neurological:     Mental Status: He is alert and oriented to person, place, and time.  Psychiatric:        Mood and Affect: Mood and affect normal.        Behavior: Behavior  normal.        Thought Content: Thought content normal.        Judgment: Judgment normal.     Ortho Exam patient has tenderness across anterior ankle joint.  Flexion-extension ankle is nonpainful no sharp pain like he had on Friday.  Good posterior tibial strength with resistive testing.  Normal arch. Specialty Comments:  No specialty comments available.  Imaging: XR Ankle Complete Left  Result Date: 05/29/2020 AP lateral mortise x-rays obtained left ankle.  This shows talar chondral abnormality consistent with previous MRI scan on 12/16/2019.  No acute changes.  Small calcification posterior ankle joint which may represent loose body.  Anterior and posterior ankle spurring with mild joint narrowing Impression: Left ankle negative for acute changes.  Previously noted chondral lesion talar dome.  Mild ankle arthritis again noted.  XR Foot Complete Left  Result Date: 05/29/2020 Three-view x-rays left foot obtained and reviewed.  This shows minimal midfoot dorsal spurring.  Negative for acute injury.  Ankle arthritic  spurring noted. Impression: Left foot negative for acute bony injury.  Mild midfoot dorsal spurring.    PMFS History: Patient Active Problem List   Diagnosis Date Noted  . Transient synovitis of left elbow 12/21/2019  . Posterior tibial tendonitis, left 11/14/2019  . Colon cancer screening   . Benign neoplasm of transverse colon   . HYPOGONADISM 06/12/2010  . OBSTRUCTIVE SLEEP APNEA 05/07/2010  . HYPERLIPIDEMIA 10/24/2009  . ABDOMINAL PAIN, EPIGASTRIC 09/17/2009  . Lumbago 12/05/2008  . GERD 11/15/2007  . PALPITATIONS 11/15/2007  . OSTEOARTHRITIS 07/09/2007  . LUMBAR STRAIN 07/09/2007  . ANXIETY 02/01/2007  . ALLERGIC RHINITIS 02/01/2007  . [place], [place] 02/01/2007  . CHICKENPOX, HX OF 02/01/2007   Past Medical History:  Diagnosis Date  . Allergy   . Anxiety   . Arthritis    Both Knees  . Chronic low back pain    had seen Dr. Rennis Harding, the Workers Comp case has been settled  . Depression   . Family history of adverse reaction to anesthesia    sister ponv  . GERD (gastroesophageal reflux disease)   . Hyperlipidemia   . Hypogonadism male    sees Dr. Risa Grill   . Plantar fasciitis    hx of  . 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 Risa Grill   . Sleep apnea, obstructive    sees Dr. Gwenette Greet, uses CPAP  setting of 12    Family History  Problem Relation Age of Onset  . Stroke Father   . Anesthesia problems Neg Hx     Past Surgical History:  Procedure Laterality Date  . APPENDECTOMY    . BACK SURGERY  2016   Laminectomy  . CARPAL TUNNEL RELEASE Left 02-14-14   per Dr. [name]   . COLONOSCOPY  2008   clear, repeat in 10 yrs   . COLONOSCOPY WITH PROPOFOL N/A 11/06/2016   Procedure: COLONOSCOPY WITH PROPOFOL;  Surgeon: Milus Banister, MD;  Location: [facility];  Service: Endoscopy;  Laterality: N/A;  . JOINT REPLACEMENT  [id number]   bilateral knees, Dr. Percell Miller   . RADIOFREQUENCY ABLATION  02/2011   x 2  . Scrotal Abcess     I/D - healed  .  TONSILLECTOMY    . TOTAL KNEE REVISION  10/01/2011   Procedure: TOTAL KNEE REVISION;  Surgeon: Ninetta Lights, MD;  Location: [facility];  Service: Orthopedics;  Laterality: Left;  left total knee revision, tibial and patellar components  Social History   Occupational History  . Not on file  Tobacco Use  . Smoking status: Never Smoker  . Smokeless tobacco: Never Used  Vaping Use  . Vaping Use: Never used  Substance and Sexual Activity  . Alcohol use: Yes    Alcohol/week: 0.0 standard drinks    Comment: occ  . Drug use: No  . Sexual activity: Not on file

## 2020-06-04 ENCOUNTER — Telehealth: Payer: Self-pay | Admitting: Internal Medicine

## 2020-06-04 DIAGNOSIS — E782 Mixed hyperlipidemia: Secondary | ICD-10-CM

## 2020-06-04 DIAGNOSIS — Z87898 Personal history of other specified conditions: Secondary | ICD-10-CM

## 2020-06-04 NOTE — Telephone Encounter (Signed)
Patient is calling to see what is the next step? Says he hasn't heard anything from Dr. Caryl Comes. Please call back

## 2020-06-06 ENCOUNTER — Other Ambulatory Visit: Payer: Self-pay

## 2020-06-06 ENCOUNTER — Ambulatory Visit (INDEPENDENT_AMBULATORY_CARE_PROVIDER_SITE_OTHER)
Admission: RE | Admit: 2020-06-06 | Discharge: 2020-06-06 | Disposition: A | Discharge status: Home / Self Care | Payer: Self-pay | Source: Ambulatory Visit | Attending: Internal Medicine | Admitting: Internal Medicine

## 2020-06-06 DIAGNOSIS — Z87898 Personal history of other specified conditions: Secondary | ICD-10-CM

## 2020-06-06 DIAGNOSIS — E782 Mixed hyperlipidemia: Secondary | ICD-10-CM

## 2020-06-06 NOTE — Telephone Encounter (Signed)
Spoke with pt and advised per Dr Caryl Comes he would recommend pt have a cardiac scoring CT.  Pt is agreeable to this testing.  Order placed and will forward information for scheduling.  Pt verbalizes understanding and agrees with current plan.

## 2020-06-08 ENCOUNTER — Telehealth: Payer: Self-pay | Admitting: Internal Medicine

## 2020-06-08 NOTE — Telephone Encounter (Signed)
Returned call to pt and left a detailed message that his Cardiac CT Scoring hasn't been resulted as of yet and when it has been, someone will call him with those.

## 2020-06-08 NOTE — Telephone Encounter (Signed)
Patient is calling to receive his CT results. Please call

## 2020-06-12 ENCOUNTER — Encounter: Payer: Self-pay | Admitting: Family Medicine

## 2020-06-12 NOTE — Telephone Encounter (Signed)
Pt called back in to check on the status on the CT scan results   Best number [phone]

## 2020-06-13 NOTE — Telephone Encounter (Signed)
Patient calling back frustrated. He states he has been waiting 2 months for the results of his CT. Please call.

## 2020-06-13 NOTE — Telephone Encounter (Signed)
Spoke with pt and made him aware that we are waiting on Dr. Caryl Comes to review his CT.  I did mention to pt that per reading physician his Calcium Score is 0.  Advised once Dr. Caryl Comes reviews, we will give him a call with final report.  Pt appreciative for call.

## 2020-06-14 NOTE — Telephone Encounter (Signed)
Spoke with pt and advised per Dr Caryl Comes, Calcium score results are 0 which is a very good predictor of future cardiac events in the next few years.  Pt verbalizes understanding and thanked Therapist, sports for call.

## 2021-03-19 ENCOUNTER — Telehealth: Payer: Self-pay | Admitting: Orthopaedic Surgery

## 2021-03-19 NOTE — Telephone Encounter (Signed)
Pt called requesting a call back from [place]. Pt did not disclose reasoning for call. Please call pt at [phone].

## 2021-03-19 NOTE — Telephone Encounter (Signed)
Tried calling to discuss-holding for Raymond Herrera to follow up on.

## 2021-03-21 NOTE — Telephone Encounter (Signed)
Tried calling again.

## 2021-03-25 NOTE — Telephone Encounter (Signed)
I called patient. He is having increasing difficulty with his foot and would like to know what his options are. He has an appointment this week and will discuss then.

## 2021-03-27 ENCOUNTER — Ambulatory Visit: Payer: Medicare HMO | Admitting: Orthopaedic Surgery

## 2021-03-27 ENCOUNTER — Encounter: Payer: Self-pay | Admitting: Orthopaedic Surgery

## 2021-03-27 ENCOUNTER — Ambulatory Visit: Payer: Self-pay

## 2021-03-27 ENCOUNTER — Other Ambulatory Visit: Payer: Self-pay

## 2021-03-27 VITALS — BP 146/87 | Ht 73.5 in | Wt 386.0 lb

## 2021-03-27 DIAGNOSIS — M25572 Pain in left ankle and joints of left foot: Secondary | ICD-10-CM | POA: Diagnosis not present

## 2021-03-27 DIAGNOSIS — M958 Other specified acquired deformities of musculoskeletal system: Secondary | ICD-10-CM | POA: Diagnosis not present

## 2021-03-27 NOTE — Progress Notes (Signed)
Office Visit Note   Patient: Raymond Herrera           Date of Birth: Mar 07, 1956           MRN: [id number] Visit Date: 03/27/2021              Requested by: [name], MD [place],  [place] [address] PCP: [name], MD   Assessment & Plan: Visit Diagnoses:  1. Pain in left ankle and joints of left foot   2. Osteochondral defect of ankle     Plan: Patient has stage IV osteochondral defect measured 8 x 6 x 8 mm with fragment displaced not visualized on previous MRI.  Repeat x-rays today do not show the fragment.  We discussed options including arthroscopic evaluation treatment and removal of chondral loose body appeared to be located.  Previously was not visualized on plain radiograph as well as repeat radiographs today and previous MRI.  With his intermittent catching symptoms he likely has this fragment mobile within the joint.  He states he wants to look at scheduling date options and will call about proceeding with ankle arthroscopy.  We also discussed possible drilling of the fragment which would likely have to to be done from the medial portal or possibly transmalleolar.  This was discussed in detail.  He will call about scheduling if he like to proceed.  Follow-Up Instructions: No follow-ups on file.   Orders:  Orders Placed This Encounter  Procedures   XR Ankle Complete Left   No orders of the defined types were placed in this encounter.     Procedures: No procedures performed   Clinical Data: No additional findings.   Subjective: Chief Complaint  Patient presents with   Left Foot - Pain    HPI 65 year old male returns with increased pain in his left foot.  He had osteochondral changes on x-ray medial dome of the talus.  He states it feels like the pieces floated over to the lateral portion of the joint and when he ambulates at times he feels an extremely sharp pain in his ankle that stops him.  He states it was really painful  for 3 days moving around and it seems like it is gotten somewhat better.  He notes increased symptoms of these on his foot more.  He states he is ready to consider surgery.  Patient states she has chronic back pain problems and is on oxycodone 10 mg 180 tablets a month plus Ativan 2 mg 60 tablets monthly.  Review of Systems   Objective: Vital Signs: BP (!) 146/87   Ht 6' 1.5" (1.867 m)   Wt (!) 386 lb (175.1 kg)   BMI 50.24 kg/m   Physical Exam  Ortho Exam  Specialty Comments:  No specialty comments available.  Imaging: Narrative & Impression  CLINICAL DATA:  Ankle pain.  Osteochondral lesion seen on x-ray   EXAM: MRI OF THE LEFT ANKLE WITHOUT CONTRAST   TECHNIQUE: Multiplanar, multisequence MR imaging of the ankle was performed. No intravenous contrast was administered.   COMPARISON:  X-ray 11/13/2019   FINDINGS: TENDONS   Peroneal: Intact peroneus longus and peroneus brevis tendons. Mild tenosynovial fluid.   Posteromedial: Marked tendinosis with high-grade partial-thickness tearing of the tibialis posterior tendon. Flexor hallucis longus and flexor digitorum longus tendons are intact. Mild tenosynovial fluid associated with the posteromedial ankle tendons.   Anterior: Intact tibialis anterior, extensor hallucis longus and extensor digitorum longus tendons.  Achilles: Intact.   Plantar Fascia: Intact.   LIGAMENTS   Lateral: The anterior and posterior tibiofibular ligaments are intact. The anterior and posterior talofibular ligaments are intact. Intact calcaneofibular ligament.   Medial: High-grade sprain and partial tear of the superomedial portion of the spring ligament complex. The plantar portions of the ligament are not well seen and likely torn. Intact deltoid ligament.   CARTILAGE   Ankle Joint: Focal osteochondral lesion of the posteromedial talar shoulder measuring approximately 8 x 6 x 8 mm (series 5, image 11; series 9, image 21). Fragment  is displaced from the donor site. A definitive fragment is not visualized. Tibiotalar joint cartilage is otherwise intact. No tibiotalar joint effusion.   Subtalar Joints/Sinus Tarsi: No joint effusion or chondral defect. Preservation of the anatomic fat within the sinus tarsi. Small ganglion cyst emanates from the roof of the sinus tarsi (series 8, images 17-21).   Bones: Mild-to-moderate arthropathy throughout the midfoot, most severe at the fourth TMT joint. No acute fracture. No dislocation.   Other: Nonspecific circumferential subcutaneous edema. No soft tissue fluid collection.   IMPRESSION: 1. 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 Fragment is displaced from the donor site. A definitive fragment is not visualized. 2. Marked tendinosis with high-grade partial-thickness tearing of the tibialis posterior tendon. 3. High-grade sprain and partial tear of the superomedial portion of the spring ligament complex. The plantar portions of the ligament are not well seen and likely torn. 4. Mild-to-moderate arthropathy throughout the midfoot, most severe at the fourth TMT joint.     Electronically Signed   By: Davina Poke D.O.   On: 12/16/2019 13:06      PMFS History: Patient Active Problem List   Diagnosis Date Noted   Osteochondral defect of ankle 04/01/2021   Transient synovitis of left elbow 12/21/2019   Posterior tibial tendonitis, left 11/14/2019   Colon cancer screening    Benign neoplasm of transverse colon    HYPOGONADISM 06/12/2010   OBSTRUCTIVE SLEEP APNEA 05/07/2010   HYPERLIPIDEMIA 10/24/2009   ABDOMINAL PAIN, EPIGASTRIC 09/17/2009   Lumbago 12/05/2008   GERD 11/15/2007   PALPITATIONS 11/15/2007   OSTEOARTHRITIS 07/09/2007   LUMBAR STRAIN 07/09/2007   ANXIETY 02/01/2007   ALLERGIC RHINITIS 02/01/2007   FASCIITIS, PLANTAR 02/01/2007   CHICKENPOX, HX OF 02/01/2007   Past Medical  History:  Diagnosis Date   Allergy    Anxiety    Arthritis    Both Knees   Chronic low back pain    had seen Dr. Rennis Harding, the Workers Comp case has been settled   Depression    Family history of adverse reaction to anesthesia    sister ponv   GERD (gastroesophageal reflux disease)    Hyperlipidemia    Hypogonadism male    sees Dr. Risa Grill    Plantar fasciitis    hx of   Scrotal abscess 06-06-10   MRSA, lanced by Dr. Risa Grill    Sleep apnea, obstructive    sees Dr. Gwenette Greet, uses CPAP  setting of 12    Family History  Problem Relation Age of Onset   Stroke Father    Anesthesia problems Neg Hx     Past Surgical History:  Procedure Laterality Date   APPENDECTOMY     BACK SURGERY  2016   Laminectomy   CARPAL TUNNEL RELEASE Left 02-14-14   per Dr. [name]    COLONOSCOPY  2008   clear, repeat  in 10 yrs    COLONOSCOPY WITH PROPOFOL N/A 11/06/2016   Procedure: COLONOSCOPY WITH PROPOFOL;  Surgeon: Milus Banister, MD;  Location: [facility];  Service: Endoscopy;  Laterality: N/A;   JOINT REPLACEMENT  20060,2007   bilateral knees, Dr. Percell Miller    RADIOFREQUENCY ABLATION  02/2011   x 2   Scrotal Abcess     I/D - healed   TONSILLECTOMY     TOTAL KNEE REVISION  10/01/2011   Procedure: TOTAL KNEE REVISION;  Surgeon: Ninetta Lights, MD;  Location: [facility];  Service: Orthopedics;  Laterality: Left;  left total knee revision, tibial and patellar components   Social History   Occupational History   Not on file  Tobacco Use   Smoking status: Never   Smokeless tobacco: Never  Vaping Use   Vaping Use: Never used  Substance and Sexual Activity   Alcohol use: Yes    Alcohol/week: 0.0 standard drinks    Comment: occ   Drug use: No   Sexual activity: Not on file

## 2021-04-01 DIAGNOSIS — M958 Other specified acquired deformities of musculoskeletal system: Secondary | ICD-10-CM | POA: Insufficient documentation

## 2021-04-03 DIAGNOSIS — I10 Essential (primary) hypertension: Secondary | ICD-10-CM | POA: Insufficient documentation

## 2021-06-05 ENCOUNTER — Ambulatory Visit: Payer: Medicare HMO | Admitting: Podiatry

## 2021-06-05 ENCOUNTER — Other Ambulatory Visit: Payer: Self-pay

## 2021-06-05 DIAGNOSIS — L600 Ingrowing nail: Secondary | ICD-10-CM | POA: Diagnosis not present

## 2021-06-05 MED ORDER — GENTAMICIN SULFATE 0.1 % EX CREA: 1.0000 "application " | TOPICAL_CREAM | Freq: Two times a day (BID) | CUTANEOUS | 1 refills | Status: DC

## 2021-06-05 NOTE — Progress Notes (Signed)
° °  Subjective: Patient presents today for evaluation of pain to the medial border left great toe. Patient is concerned for possible ingrown nail.  It is very sensitive to touch.  Patient presents today for further treatment and evaluation.  Past Medical History:  Diagnosis Date   Allergy    Anxiety    Arthritis    Both Knees   Chronic low back pain    had seen Dr. Rennis Harding, the Workers Comp case has been settled   Depression    Family history of adverse reaction to anesthesia    sister ponv   GERD (gastroesophageal reflux disease)    Hyperlipidemia    Hypogonadism male    sees Dr. Risa Grill    Plantar fasciitis    hx of   Scrotal abscess 06-06-10   MRSA, lanced by Dr. Risa Grill    Sleep apnea, obstructive    sees Dr. Gwenette Greet, uses CPAP  setting of 12    Objective:  General: Well developed, nourished, in no acute distress, alert and oriented x3   Dermatology: Skin is warm, dry and supple bilateral.  Medial border left great toe appears to be erythematous with evidence of an ingrowing nail. Pain on palpation noted to the border of the nail fold. The remaining nails appear unremarkable at this time. There are no open sores, lesions.  Vascular: Dorsalis Pedis artery and Posterior Tibial artery pedal pulses palpable. No lower extremity edema noted.   Neruologic: Grossly intact via light touch bilateral.  Musculoskeletal: Muscular strength within normal limits in all groups bilateral. Normal range of motion noted to all pedal and ankle joints.   Assesement: #1 Paronychia with ingrowing nail medial border left great toe #2 Pain in toe  Plan of Care:  1. Patient evaluated.  2. Discussed treatment alternatives and plan of care. Explained nail avulsion procedure and post procedure course to patient. 3. Patient opted for permanent partial nail avulsion of the ingrown portion of the nail.  4. Prior to procedure, local anesthesia infiltration utilized using 3 ml of a 50:50 mixture of 2%  plain lidocaine and 0.5% plain marcaine in a normal hallux block fashion and a betadine prep performed.  5. Partial permanent nail avulsion with chemical matrixectomy performed using [id number] applications of phenol followed by alcohol flush.  6. Light dressing applied.  Post care instructions provided 7.  Prescription for gentamicin 2% cream  8.  Return to clinic 2 weeks.  Edrick Kins, DPM [facility]  Dr. Edrick Kins, DPM    [address], [place] [address]                Office [phone]  Fax [phone]

## 2021-06-19 ENCOUNTER — Other Ambulatory Visit: Payer: Self-pay

## 2021-06-19 ENCOUNTER — Ambulatory Visit: Payer: Medicare HMO | Admitting: Podiatry

## 2021-06-19 DIAGNOSIS — L600 Ingrowing nail: Secondary | ICD-10-CM

## 2021-06-20 ENCOUNTER — Telehealth: Payer: Self-pay | Admitting: Orthopaedic Surgery

## 2021-06-20 NOTE — Telephone Encounter (Signed)
Please advise 

## 2021-06-20 NOTE — Telephone Encounter (Signed)
noted 

## 2021-06-20 NOTE — Telephone Encounter (Signed)
Could you please put this on a rx, stamp, and put up front for patient to pick up? I will be glad to call and let him know it's ready if needed.  Thanks.

## 2021-06-20 NOTE — Telephone Encounter (Signed)
Patient is requesting prescription for a walker to use following his procedure on the 7th since e will be NWB following surgery.

## 2021-06-21 ENCOUNTER — Telehealth: Payer: Self-pay | Admitting: Orthopaedic Surgery

## 2021-06-21 NOTE — Telephone Encounter (Signed)
Please advise 

## 2021-06-21 NOTE — Telephone Encounter (Signed)
Patient would like to know what will his restrictions be after his surgery as far as weight bearing. He will need this information before surgery in order to get his home ready.   Patient's call back # [phone]

## 2021-06-24 NOTE — Telephone Encounter (Signed)
I called patient and advised. 

## 2021-06-25 NOTE — Progress Notes (Signed)
° °  Subjective: 66 y.o. male presents today status post permanent nail avulsion procedure of the medial border of the left great toe that was performed on 06/05/2021.  Patient states that he is doing well.  He has less pain.  Initially there was some drainage but that has slowly improved over the last 2 weeks.  No new complaints at this time.   Past Medical History:  Diagnosis Date   Allergy    Anxiety    Arthritis    Both Knees   Chronic low back pain    had seen Dr. Rennis Harding, the Workers Comp case has been settled   Depression    Family history of adverse reaction to anesthesia    sister ponv   GERD (gastroesophageal reflux disease)    Hyperlipidemia    Hypogonadism male    sees Dr. Risa Grill    Plantar fasciitis    hx of   Scrotal abscess 06-06-10   MRSA, lanced by Dr. Risa Grill    Sleep apnea, obstructive    sees Dr. Gwenette Greet, uses CPAP  setting of 12    Objective: Skin is warm, dry and supple. Nail and respective nail fold appears to be healing appropriately. Open wound to the associated nail fold with a granular wound base and moderate amount of fibrotic tissue. Minimal drainage noted. Mild erythema around the periungual region likely due to phenol chemical matricectomy.  Assessment: #1 s/p partial permanent nail matrixectomy medial border left great toe   Plan of care: #1 patient was evaluated  #2 light debridement of open wound was performed to the periungual border of the respective toe using a currette. Antibiotic ointment and Band-Aid was applied. #3 patient is to return to clinic on a PRN basis.   Edrick Kins, DPM [facility]  Dr. Edrick Kins, DPM    [address], [place] [address]                Office [phone]  Fax [phone]

## 2021-07-04 ENCOUNTER — Other Ambulatory Visit: Payer: Self-pay

## 2021-07-04 ENCOUNTER — Encounter (HOSPITAL_COMMUNITY): Payer: Self-pay | Admitting: Orthopaedic Surgery

## 2021-07-04 NOTE — Progress Notes (Signed)
Mr. Beg denies chest pain or shortness of breath. Patient denies having any s/s of Covid in his household.  Patient denies any known exposure to Covid.   PCP: Dr. Ledora Bottcher. Cardiologist : Dr. Caryl Comes Mr. Dangerfield was sent to see Dr. Caryl Comes 05/2020 by patient's PCP, with concern of an abnormal EKG.  Mr. Knick states that Dr. Caryl Comes did not see new with EKG, a Cardiac Calcium Scan was done with a 0 result.   Dr. Caryl Comes told patient he does not need to come back to see him in at least 10 years, since Cardiac Calcium Scan score was 0.  Mr. Starleen Blue has pre- diabetes, last A1C was  6.2 , drawn 11/9/ 22. Patient does not check CBG.  I instructed Mr. Cremeans to shower with antibiotic soap, if it is available.  Dry off with a clean towel. Do not put lotion, powder, cologne or deodorant or makeup.No jewelry or piercings. Men may shave their face and neck. Woman should not shave. No nail polish, artificial or acrylic nails. Wear clean clothes, brush your teeth. Glasses, contact lens,dentures or partials may not be worn in the OR. If you need to wear them, please bring a case for glasses, do not wear contacts or bring a case, the hospital does not have contact cases, dentures or partials will have to be removed , make sure they are clean, we will provide a denture cup to put them in. You will need some one to drive you home and a responsible person over the age of 53 to stay with you for the first 24 hours after surgery.

## 2021-07-04 NOTE — Progress Notes (Signed)
Mr. Raymond Herrera reports he has not taken Aspirin in a while; it is prn for pain. I insturucted patient to hold ASA, Vitamins, herbal products, Celebrex and Voltren gel until Dr. Lorin Mercy gives permission to continue.

## 2021-07-05 ENCOUNTER — Encounter (HOSPITAL_COMMUNITY)
Admission: RE | Disposition: A | Discharge status: Home / Self Care | Payer: Self-pay | Source: Home / Self Care | Attending: Orthopaedic Surgery

## 2021-07-05 ENCOUNTER — Other Ambulatory Visit: Payer: Self-pay

## 2021-07-05 ENCOUNTER — Ambulatory Visit (HOSPITAL_BASED_OUTPATIENT_CLINIC_OR_DEPARTMENT_OTHER): Payer: Medicare HMO | Admitting: Physician Assistant

## 2021-07-05 ENCOUNTER — Ambulatory Visit (HOSPITAL_COMMUNITY)
Admission: RE | Admit: 2021-07-05 | Discharge: 2021-07-05 | Disposition: A | Discharge status: Home / Self Care | Payer: Medicare HMO | Attending: Orthopaedic Surgery | Admitting: Orthopaedic Surgery

## 2021-07-05 ENCOUNTER — Encounter (HOSPITAL_COMMUNITY): Payer: Self-pay | Admitting: Orthopaedic Surgery

## 2021-07-05 ENCOUNTER — Ambulatory Visit (HOSPITAL_COMMUNITY): Payer: Medicare HMO | Admitting: Physician Assistant

## 2021-07-05 DIAGNOSIS — E119 Type 2 diabetes mellitus without complications: Secondary | ICD-10-CM | POA: Insufficient documentation

## 2021-07-05 DIAGNOSIS — I10 Essential (primary) hypertension: Secondary | ICD-10-CM | POA: Diagnosis not present

## 2021-07-05 DIAGNOSIS — M21962 Unspecified acquired deformity of left lower leg: Secondary | ICD-10-CM

## 2021-07-05 DIAGNOSIS — G8929 Other chronic pain: Secondary | ICD-10-CM | POA: Insufficient documentation

## 2021-07-05 DIAGNOSIS — Z6841 Body Mass Index (BMI) 40.0 and over, adult: Secondary | ICD-10-CM | POA: Insufficient documentation

## 2021-07-05 DIAGNOSIS — E785 Hyperlipidemia, unspecified: Secondary | ICD-10-CM | POA: Diagnosis not present

## 2021-07-05 DIAGNOSIS — Z79891 Long term (current) use of opiate analgesic: Secondary | ICD-10-CM | POA: Diagnosis not present

## 2021-07-05 DIAGNOSIS — K219 Gastro-esophageal reflux disease without esophagitis: Secondary | ICD-10-CM | POA: Insufficient documentation

## 2021-07-05 DIAGNOSIS — Z20822 Contact with and (suspected) exposure to covid-19: Secondary | ICD-10-CM | POA: Insufficient documentation

## 2021-07-05 DIAGNOSIS — M958 Other specified acquired deformities of musculoskeletal system: Secondary | ICD-10-CM

## 2021-07-05 DIAGNOSIS — G4733 Obstructive sleep apnea (adult) (pediatric): Secondary | ICD-10-CM | POA: Diagnosis not present

## 2021-07-05 DIAGNOSIS — M25872 Other specified joint disorders, left ankle and foot: Secondary | ICD-10-CM | POA: Insufficient documentation

## 2021-07-05 DIAGNOSIS — Z01818 Encounter for other preprocedural examination: Secondary | ICD-10-CM

## 2021-07-05 DIAGNOSIS — Z79899 Other long term (current) drug therapy: Secondary | ICD-10-CM | POA: Insufficient documentation

## 2021-07-05 DIAGNOSIS — M94272 Chondromalacia, left ankle and joints of left foot: Secondary | ICD-10-CM

## 2021-07-05 HISTORY — PX: ANKLE ARTHROSCOPY: SHX545

## 2021-07-05 HISTORY — DX: Prediabetes: R73.03

## 2021-07-05 LAB — POCT I-STAT, CHEM 8
BUN: 11 mg/dL (ref 8–23)
Calcium, Ion: 1.08 mmol/L — ABNORMAL LOW (ref 1.15–1.40)
Chloride: 102 mmol/L (ref 98–111)
Creatinine, Ser: 0.9 mg/dL (ref 0.61–1.24)
Glucose, Bld: 134 mg/dL — ABNORMAL HIGH (ref 70–99)
HCT: 48 % (ref 39.0–52.0)
Hemoglobin: 16.3 g/dL (ref 13.0–17.0)
Potassium: 3.9 mmol/L (ref 3.5–5.1)
Sodium: 138 mmol/L (ref 135–145)
TCO2: 28 mmol/L (ref 22–32)

## 2021-07-05 LAB — SURGICAL PCR SCREEN
MRSA, PCR: NEGATIVE
Staphylococcus aureus: NEGATIVE

## 2021-07-05 LAB — SARS CORONAVIRUS 2 BY RT PCR (HOSPITAL ORDER, PERFORMED IN ~~LOC~~ HOSPITAL LAB): SARS Coronavirus 2: NEGATIVE

## 2021-07-05 LAB — GLUCOSE, CAPILLARY: Glucose-Capillary: 151 mg/dL — ABNORMAL HIGH (ref 70–99)

## 2021-07-05 SURGERY — ARTHROSCOPY, ANKLE
Anesthesia: General | Site: Ankle | Laterality: Left

## 2021-07-05 MED ORDER — DEXAMETHASONE SODIUM PHOSPHATE 10 MG/ML IJ SOLN
INTRAMUSCULAR | Status: DC | PRN
  Administered 2021-07-05: 5 mg via INTRAVENOUS

## 2021-07-05 MED ORDER — ROCURONIUM BROMIDE 10 MG/ML (PF) SYRINGE
PREFILLED_SYRINGE | INTRAVENOUS | Status: AC
  Filled 2021-07-05: qty 10

## 2021-07-05 MED ORDER — CEFAZOLIN IN SODIUM CHLORIDE 3-0.9 GM/100ML-% IV SOLN
3.0000 g | INTRAVENOUS | Status: AC
  Administered 2021-07-05: 3 g via INTRAVENOUS
  Filled 2021-07-05: qty 100

## 2021-07-05 MED ORDER — ACETAMINOPHEN 500 MG PO TABS
1000.0000 mg | ORAL_TABLET | Freq: Once | ORAL | Status: AC
  Administered 2021-07-05: 1000 mg via ORAL
  Filled 2021-07-05: qty 2

## 2021-07-05 MED ORDER — ONDANSETRON HCL 4 MG/2ML IJ SOLN
INTRAMUSCULAR | Status: AC
  Filled 2021-07-05: qty 2

## 2021-07-05 MED ORDER — MIDAZOLAM HCL 2 MG/2ML IJ SOLN
INTRAMUSCULAR | Status: DC | PRN
  Administered 2021-07-05: 2 mg via INTRAVENOUS

## 2021-07-05 MED ORDER — BUPIVACAINE-EPINEPHRINE 0.25% -1:200000 IJ SOLN
INTRAMUSCULAR | Status: DC | PRN
  Administered 2021-07-05: 5 mL

## 2021-07-05 MED ORDER — SODIUM CHLORIDE 0.9 % IR SOLN
Status: DC | PRN
  Administered 2021-07-05: 1000 mL
  Administered 2021-07-05 (×2): 3000 mL

## 2021-07-05 MED ORDER — LACTATED RINGERS IV SOLN
INTRAVENOUS | Status: DC | PRN
Start: 2021-07-05 — End: 2021-07-05

## 2021-07-05 MED ORDER — FENTANYL CITRATE (PF) 250 MCG/5ML IJ SOLN
INTRAMUSCULAR | Status: AC
  Filled 2021-07-05: qty 5

## 2021-07-05 MED ORDER — PROPOFOL 10 MG/ML IV BOLUS
INTRAVENOUS | Status: AC
  Filled 2021-07-05: qty 20

## 2021-07-05 MED ORDER — LIDOCAINE 2% (20 MG/ML) 5 ML SYRINGE
INTRAMUSCULAR | Status: AC
  Filled 2021-07-05: qty 5

## 2021-07-05 MED ORDER — DEXAMETHASONE SODIUM PHOSPHATE 10 MG/ML IJ SOLN
INTRAMUSCULAR | Status: AC
  Filled 2021-07-05: qty 1

## 2021-07-05 MED ORDER — SUGAMMADEX SODIUM 500 MG/5ML IV SOLN
INTRAVENOUS | Status: DC | PRN
  Administered 2021-07-05: 400 mg via INTRAVENOUS

## 2021-07-05 MED ORDER — AMISULPRIDE (ANTIEMETIC) 5 MG/2ML IV SOLN: 10.0000 mg | Freq: Once | INTRAVENOUS | Status: DC | PRN

## 2021-07-05 MED ORDER — PROPOFOL 10 MG/ML IV BOLUS
INTRAVENOUS | Status: DC | PRN
  Administered 2021-07-05: 200 mg via INTRAVENOUS
  Administered 2021-07-05: 100 mg via INTRAVENOUS

## 2021-07-05 MED ORDER — SUCCINYLCHOLINE CHLORIDE 200 MG/10ML IV SOSY
PREFILLED_SYRINGE | INTRAVENOUS | Status: AC
  Filled 2021-07-05: qty 10

## 2021-07-05 MED ORDER — BUPIVACAINE-EPINEPHRINE (PF) 0.5% -1:200000 IJ SOLN
INTRAMUSCULAR | Status: DC | PRN
  Administered 2021-07-05: 30 mL via PERINEURAL

## 2021-07-05 MED ORDER — BUPIVACAINE-EPINEPHRINE (PF) 0.25% -1:200000 IJ SOLN
INTRAMUSCULAR | Status: AC
  Filled 2021-07-05: qty 30

## 2021-07-05 MED ORDER — ONDANSETRON HCL 4 MG/2ML IJ SOLN
INTRAMUSCULAR | Status: DC | PRN
  Administered 2021-07-05: 4 mg via INTRAVENOUS

## 2021-07-05 MED ORDER — OXYCODONE HCL 10 MG PO TABS: 10.0000 mg | ORAL_TABLET | ORAL | 0 refills | Status: AC | PRN

## 2021-07-05 MED ORDER — ORAL CARE MOUTH RINSE: 15.0000 mL | Freq: Once | OROMUCOSAL | Status: AC

## 2021-07-05 MED ORDER — ROPIVACAINE HCL 5 MG/ML IJ SOLN
INTRAMUSCULAR | Status: DC | PRN
  Administered 2021-07-05: 20 mL via PERINEURAL

## 2021-07-05 MED ORDER — CHLORHEXIDINE GLUCONATE 0.12 % MT SOLN
15.0000 mL | Freq: Once | OROMUCOSAL | Status: AC
  Administered 2021-07-05: 15 mL via OROMUCOSAL
  Filled 2021-07-05: qty 15

## 2021-07-05 MED ORDER — FENTANYL CITRATE (PF) 100 MCG/2ML IJ SOLN: 25.0000 ug | INTRAMUSCULAR | Status: DC | PRN

## 2021-07-05 MED ORDER — MIDAZOLAM HCL 2 MG/2ML IJ SOLN
INTRAMUSCULAR | Status: AC
  Filled 2021-07-05: qty 2

## 2021-07-05 MED ORDER — LACTATED RINGERS IV SOLN: INTRAVENOUS | Status: DC

## 2021-07-05 MED ORDER — ROCURONIUM BROMIDE 10 MG/ML (PF) SYRINGE
PREFILLED_SYRINGE | INTRAVENOUS | Status: DC | PRN
  Administered 2021-07-05: 20 mg via INTRAVENOUS
  Administered 2021-07-05: 50 mg via INTRAVENOUS

## 2021-07-05 MED ORDER — SUCCINYLCHOLINE CHLORIDE 200 MG/10ML IV SOSY
PREFILLED_SYRINGE | INTRAVENOUS | Status: DC | PRN
  Administered 2021-07-05: 140 mg via INTRAVENOUS

## 2021-07-05 MED ORDER — FENTANYL CITRATE (PF) 250 MCG/5ML IJ SOLN
INTRAMUSCULAR | Status: DC | PRN
  Administered 2021-07-05: 100 ug via INTRAVENOUS
  Administered 2021-07-05 (×3): 50 ug via INTRAVENOUS

## 2021-07-05 MED ORDER — LIDOCAINE 2% (20 MG/ML) 5 ML SYRINGE
INTRAMUSCULAR | Status: DC | PRN
  Administered 2021-07-05: 20 mg via INTRAVENOUS

## 2021-07-05 SURGICAL SUPPLY — 35 items
BAG COUNTER SPONGE SURGICOUNT (BAG) ×2 IMPLANT
BLADE CUDA 5.5 (BLADE) IMPLANT
BLADE EXCALIBUR 4.0X13 (MISCELLANEOUS) ×2 IMPLANT
BNDG ELASTIC 6X5.8 VLCR STR LF (GAUZE/BANDAGES/DRESSINGS) ×2 IMPLANT
BNDG GAUZE ELAST 4 BULKY (GAUZE/BANDAGES/DRESSINGS) ×2 IMPLANT
BOOTCOVER CLEANROOM LRG (PROTECTIVE WEAR) ×4 IMPLANT
BUR OVAL 6.0 (BURR) IMPLANT
COVER SURGICAL LIGHT HANDLE (MISCELLANEOUS) ×2 IMPLANT
CUFF TOURN SGL QUICK 34 (TOURNIQUET CUFF)
CUFF TOURN SGL QUICK 42 (TOURNIQUET CUFF) ×1 IMPLANT
CUFF TRNQT CYL 34X4.125X (TOURNIQUET CUFF) IMPLANT
DRAPE ARTHROSCOPY W/POUCH 114 (DRAPES) ×2 IMPLANT
DRSG PAD ABDOMINAL 8X10 ST (GAUZE/BANDAGES/DRESSINGS) ×2 IMPLANT
DRSG XEROFORM 1X8 (GAUZE/BANDAGES/DRESSINGS) ×1 IMPLANT
DURAPREP 26ML APPLICATOR (WOUND CARE) ×2 IMPLANT
GAUZE SPONGE 4X4 12PLY STRL (GAUZE/BANDAGES/DRESSINGS) ×2 IMPLANT
GAUZE XEROFORM 1X8 LF (GAUZE/BANDAGES/DRESSINGS) ×2 IMPLANT
GLOVE SRG 8 PF TXTR STRL LF DI (GLOVE) ×1 IMPLANT
GLOVE SURG ORTHO LTX SZ7.5 (GLOVE) ×2 IMPLANT
GLOVE SURG UNDER POLY LF SZ8 (GLOVE) ×2
GOWN STRL REUS W/ TWL LRG LVL3 (GOWN DISPOSABLE) ×2 IMPLANT
GOWN STRL REUS W/TWL 2XL LVL3 (GOWN DISPOSABLE) IMPLANT
GOWN STRL REUS W/TWL LRG LVL3 (GOWN DISPOSABLE) ×4
KIT TURNOVER KIT B (KITS) ×2 IMPLANT
NDL HYPO 25GX1X1/2 BEV (NEEDLE) IMPLANT
NEEDLE HYPO 25GX1X1/2 BEV (NEEDLE) IMPLANT
PACK ARTHROSCOPY DSU (CUSTOM PROCEDURE TRAY) ×2 IMPLANT
PAD ARMBOARD 7.5X6 YLW CONV (MISCELLANEOUS) ×4 IMPLANT
PADDING CAST COTTON 6X4 STRL (CAST SUPPLIES) ×2 IMPLANT
SPONGE T-LAP 4X18 ~~LOC~~+RFID (SPONGE) ×2 IMPLANT
STRAP ANKLE FOOT DISTRACTOR (ORTHOPEDIC SUPPLIES) ×2 IMPLANT
STRAP ANKLE SMOOTH DISTRACTION (ORTHOPEDIC DISPOSABLE SUPPLIES) ×1 IMPLANT
SUT ETHILON 4 0 PS 2 18 (SUTURE) IMPLANT
TOWEL GREEN STERILE FF (TOWEL DISPOSABLE) ×4 IMPLANT
TUBING ARTHROSCOPY IRRIG 16FT (MISCELLANEOUS) ×2 IMPLANT

## 2021-07-05 NOTE — Anesthesia Procedure Notes (Signed)
Anesthesia Regional Block: Adductor canal block   Pre-Anesthetic Checklist: , timeout performed,  Correct Patient, Correct Site, Correct Laterality,  Correct Procedure, Correct Position, site marked,  Risks and benefits discussed,  Surgical consent,  Pre-op evaluation,  At surgeon's request and post-op pain management  Laterality: Left  Prep: chloraprep       Needles:  Injection technique: Single-shot  Needle Type: Echogenic Needle     Needle Length: 9cm  Needle Gauge: 21     Additional Needles:   Procedures:,,,, ultrasound used (permanent image in chart),,    Narrative:  Start time: 07/05/2021 7:01 AM End time: 07/05/2021 7:06 AM Injection made incrementally with aspirations every 5 mL.  Performed by: Personally  Anesthesiologist: Suzette Battiest, MD

## 2021-07-05 NOTE — Anesthesia Procedure Notes (Signed)
Procedure Name: Intubation Date/Time: 07/05/2021 7:54 AM Performed by: Harden Mo, CRNA Pre-anesthesia Checklist: Patient identified, Emergency Drugs available, Suction available and Patient being monitored Patient Re-evaluated:Patient Re-evaluated prior to induction Oxygen Delivery Method: Circle System Utilized Preoxygenation: Pre-oxygenation with 100% oxygen Induction Type: IV induction and Rapid sequence Laryngoscope Size: Glidescope and 4 Grade View: Grade I Tube type: Oral Tube size: 7.5 mm Number of attempts: 1 Airway Equipment and Method: Stylet and Oral airway Placement Confirmation: ETT inserted through vocal cords under direct vision, positive ETCO2 and breath sounds checked- equal and bilateral Secured at: 23 cm Tube secured with: Tape Dental Injury: Teeth and Oropharynx as per pre-operative assessment

## 2021-07-05 NOTE — Progress Notes (Signed)
Per microbiology, covid test is negative.

## 2021-07-05 NOTE — Discharge Instructions (Addendum)
Do not remove dressing  No driving  Touchdown weightbearing only left foot until return office visit with Dr Lorin Mercy one week.  Use knee scooter.

## 2021-07-05 NOTE — Anesthesia Procedure Notes (Signed)
Anesthesia Regional Block: Popliteal block   Pre-Anesthetic Checklist: , timeout performed,  Correct Patient, Correct Site, Correct Laterality,  Correct Procedure, Correct Position, site marked,  Risks and benefits discussed,  Surgical consent,  Pre-op evaluation,  At surgeon's request and post-op pain management  Laterality: Left  Prep: chloraprep       Needles:  Injection technique: Single-shot  Needle Type: Echogenic Needle     Needle Length: 9cm  Needle Gauge: 21     Additional Needles:   Procedures:,,,, ultrasound used (permanent image in chart),,    Narrative:  Start time: 07/05/2021 6:55 AM End time: 07/05/2021 7:01 AM Injection made incrementally with aspirations every 5 mL.  Performed by: Personally  Anesthesiologist: Suzette Battiest, MD

## 2021-07-05 NOTE — Anesthesia Preprocedure Evaluation (Signed)
Anesthesia Evaluation  Patient identified by MRN, date of birth, ID band Patient awake    Reviewed: Allergy & Precautions, NPO status , Patient's Chart, lab work & pertinent test results  Airway Mallampati: IV  TM Distance: >3 FB Neck ROM: Full    Dental  (+) Dental Advisory Given   Pulmonary sleep apnea ,    breath sounds clear to auscultation       Cardiovascular hypertension,  Rhythm:Regular Rate:Normal     Neuro/Psych negative neurological ROS     GI/Hepatic Neg liver ROS, GERD  Medicated,  Endo/Other  Morbid obesity  Renal/GU negative Renal ROS     Musculoskeletal  (+) Arthritis ,   Abdominal   Peds  Hematology negative hematology ROS (+)   Anesthesia Other Findings   Reproductive/Obstetrics                             Lab Results  Component Value Date   WBC 7.1 11/24/2014   HGB 16.3 07/05/2021   HCT 48.0 07/05/2021   MCV 91.0 11/24/2014   PLT 188.0 11/24/2014   Lab Results  Component Value Date   CREATININE 0.90 07/05/2021   BUN 11 07/05/2021   NA 138 07/05/2021   K 3.9 07/05/2021   CL 102 07/05/2021   CO2 25 11/24/2014    Anesthesia Physical Anesthesia Plan  ASA: 3  Anesthesia Plan: General   Post-op Pain Management: Regional block and Tylenol PO (pre-op)   Induction: Intravenous  PONV Risk Score and Plan: 2 and Dexamethasone, Ondansetron and Treatment may vary due to age or medical condition  Airway Management Planned: Oral ETT and Video Laryngoscope Planned  Additional Equipment: None  Intra-op Plan:   Post-operative Plan: Extubation in OR  Informed Consent: I have reviewed the patients History and Physical, chart, labs and discussed the procedure including the risks, benefits and alternatives for the proposed anesthesia with the patient or authorized representative who has indicated his/her understanding and acceptance.     Dental advisory  given  Plan Discussed with: CRNA  Anesthesia Plan Comments:         Anesthesia Quick Evaluation

## 2021-07-05 NOTE — Op Note (Signed)
Pre and postop diagnosis cyst left ankle stage IV osteochondral defect posterior medial talus.  Chondromalacia.  Procedure: Left ankle arthroscopy and chondroplasty limited.  Surgeon: Lorin Mercy MD  Assistant: Benjiman Core, PA-C present for the entire procedure and necessary for ankle traction.  Procedure: After induction of general anesthesia proximal thigh tourniquet that was not used calf leg holder well leg holder DuraPrep was used Ancef was given prophylactically.  Patient had osteochondral defect which was posterior medial and MRI did not show a definite fragment location.  He did have persistent pain failed anti-inflammatories therapy exercises, intra-articular injection.  Patient has BMI of 49 and persistent chronic ankle pain despite conservative treatment.  After timeout procedure incision was made anterolateral and anteromedial with insufflation of the joint with some Marcaine local stab incision blunt spreading and entering the joint and the blunt trocar.  Synovectomy was performed anteriorly for visualization.  There was chondral wear on the tibia that was smoothed with a 4.2 shoot shaver.  Some chondral wear in the distal tib-fib joint which was also debrided.  Careful visualization throughout the joint medial lateral gutters did not reveal loose fragment.  Osteochondral divot could be palpated scope anteriorly in the anteromedial portal the depth of the defect could not be visualized but was palpated and was shallow.  Edges were smoothed the shaver was used to smooth the edges of the osteochondral lesion.  Areas of roughening of the talar chondral surface was smooth.  Ankle was thoroughly irrigated aspirated and nylon sutures placed the 2 portals.  Patient tolerated procedure well transferred recovery in stable condition.  Probe but despite traction and maximal ankle dorsiflexion

## 2021-07-05 NOTE — Transfer of Care (Signed)
Immediate Anesthesia Transfer of Care Note  Patient: Raymond Herrera  Procedure(s) Performed: LEFT ANKLE ARTHROSCOPY, DEBRIDEMENT OSTEOCHONDRAL DEFECT, partial synovectomy (Left: Ankle)  Patient Location: PACU  Anesthesia Type:General  Level of Consciousness: awake, alert  and oriented  Airway & Oxygen Therapy: Patient Spontanous Breathing and Patient connected to nasal cannula oxygen  Post-op Assessment: Report given to RN, Post -op Vital signs reviewed and stable and Patient moving all extremities X 4  Post vital signs: Reviewed and stable  Last Vitals:  Vitals Value Taken Time  BP 162/82 07/05/21 0908  Temp    Pulse 90 07/05/21 0908  Resp 13 07/05/21 0908  SpO2 94 % 07/05/21 0908    Last Pain:  Vitals:   07/05/21 0619  TempSrc: Oral  PainSc:          Complications: No notable events documented.

## 2021-07-05 NOTE — H&P (Signed)
Assessment & Plan: Visit Diagnoses:  1. Pain in left ankle and joints of left foot   2. Osteochondral defect of ankle       Plan: Patient has stage IV osteochondral defect measured 8 x 6 x 8 mm with fragment displaced not visualized on previous MRI.  Repeat x-rays today do not show the fragment.  We discussed options including arthroscopic evaluation treatment and removal of chondral loose body appeared to be located.  Previously was not visualized on plain radiograph as well as repeat radiographs today and previous MRI.  With his intermittent catching symptoms he likely has this fragment mobile within the joint.  He states he wants to look at scheduling date options and will call about proceeding with ankle arthroscopy.  We also discussed possible drilling of the fragment which would likely have to to be done from the medial portal or possibly transmalleolar.  This was discussed in detail.  He will call about scheduling if he like to proceed.   Follow-Up Instructions: No follow-ups on file.    Orders:     Orders Placed This Encounter  Procedures   XR Ankle Complete Left    No orders of the defined types were placed in this encounter.        Procedures: No procedures performed     Clinical Data: No additional findings.     Subjective:    Chief Complaint  Patient presents with   Left Foot - Pain      HPI 66 year old male returns with increased pain in his left foot.  He had osteochondral changes on x-ray medial dome of the talus.  He states it feels like the pieces floated over to the lateral portion of the joint and when he ambulates at times he feels an extremely sharp pain in his ankle that stops him.  He states it was really painful for 3 days moving around and it seems like it is gotten somewhat better.  He notes increased symptoms of these on his foot more.  He states he is ready to consider surgery.  Patient states she has chronic back pain problems and is on oxycodone 10  mg 180 tablets a month plus Ativan 2 mg 60 tablets monthly.   Review of Systems     Objective: Vital Signs: BP (!) 146/87    Ht 6' 1.5" (1.867 m)    Wt (!) 386 lb (175.1 kg)    BMI 50.24 kg/m    Physical Exam   Ortho Exam   Specialty Comments:  No specialty comments available.   Imaging: Narrative & Impression  CLINICAL DATA:  Ankle pain.  Osteochondral lesion seen on x-ray   EXAM: MRI OF THE LEFT ANKLE WITHOUT CONTRAST   TECHNIQUE: Multiplanar, multisequence MR imaging of the ankle was performed. No intravenous contrast was administered.   COMPARISON:  X-ray 11/13/2019   FINDINGS: TENDONS   Peroneal: Intact peroneus longus and peroneus brevis tendons. Mild tenosynovial fluid.   Posteromedial: Marked tendinosis with high-grade partial-thickness tearing of the tibialis posterior tendon. Flexor hallucis longus and flexor digitorum longus tendons are intact. Mild tenosynovial fluid associated with the posteromedial ankle tendons.   Anterior: Intact tibialis anterior, extensor hallucis longus and extensor digitorum longus tendons.   Achilles: Intact.   Plantar Fascia: Intact.   LIGAMENTS   Lateral: The anterior and posterior tibiofibular ligaments are intact. The anterior and posterior talofibular ligaments are intact. Intact calcaneofibular ligament.   Medial: High-grade sprain and partial tear of the superomedial portion  of the spring ligament complex. The plantar portions of the ligament are not well seen and likely torn. Intact deltoid ligament.   CARTILAGE   Ankle Joint: Focal osteochondral lesion of the posteromedial talar shoulder measuring approximately 8 x 6 x 8 mm (series 5, image 11; series 9, image 21). Fragment is displaced from the donor site. A definitive fragment is not visualized. Tibiotalar joint cartilage is otherwise intact. No tibiotalar joint effusion.   Subtalar Joints/Sinus Tarsi: No joint effusion or chondral defect. Preservation  of the anatomic fat within the sinus tarsi. Small ganglion cyst emanates from the roof of the sinus tarsi (series 8, images 17-21).   Bones: Mild-to-moderate arthropathy throughout the midfoot, most severe at the fourth TMT joint. No acute fracture. No dislocation.   Other: Nonspecific circumferential subcutaneous edema. No soft tissue fluid collection.   IMPRESSION: 1. 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 Fragment is displaced from the donor site. A definitive fragment is not visualized. 2. Marked tendinosis with high-grade partial-thickness tearing of the tibialis posterior tendon. 3. High-grade sprain and partial tear of the superomedial portion of the spring ligament complex. The plantar portions of the ligament are not well seen and likely torn. 4. Mild-to-moderate arthropathy throughout the midfoot, most severe at the fourth TMT joint.     Electronically Signed   By: Davina Poke D.O.   On: 12/16/2019 13:06          PMFS History:     Patient Active Problem List    Diagnosis Date Noted   Osteochondral defect of ankle 04/01/2021   Transient synovitis of left elbow 12/21/2019   Posterior tibial tendonitis, left 11/14/2019   Colon cancer screening     Benign neoplasm of transverse colon     HYPOGONADISM 06/12/2010   OBSTRUCTIVE SLEEP APNEA 05/07/2010   HYPERLIPIDEMIA 10/24/2009   ABDOMINAL PAIN, EPIGASTRIC 09/17/2009   Lumbago 12/05/2008   GERD 11/15/2007   PALPITATIONS 11/15/2007   OSTEOARTHRITIS 07/09/2007   LUMBAR STRAIN 07/09/2007   ANXIETY 02/01/2007   ALLERGIC RHINITIS 02/01/2007   FASCIITIS, PLANTAR 02/01/2007   CHICKENPOX, HX OF 02/01/2007        Past Medical History:  Diagnosis Date   Allergy     Anxiety     Arthritis      Both Knees   Chronic low back pain      had seen Dr. Rennis Harding, the Workers Comp case has been settled   Depression     Family history of adverse  reaction to anesthesia      sister ponv   GERD (gastroesophageal reflux disease)     Hyperlipidemia     Hypogonadism male      sees Dr. Risa Grill    Plantar fasciitis      hx of   Scrotal abscess 06-06-10    MRSA, lanced by Dr. Risa Grill    Sleep apnea, obstructive      sees Dr. Gwenette Greet, uses CPAP  setting of 12         Family History  Problem Relation Age of Onset   Stroke Father     Anesthesia problems Neg Hx           Past Surgical History:  Procedure Laterality Date   APPENDECTOMY       BACK SURGERY   2016    Laminectomy   CARPAL TUNNEL RELEASE Left 02-14-14    per Dr. [name]  COLONOSCOPY   2008    clear, repeat in 10 yrs    COLONOSCOPY WITH PROPOFOL N/A 11/06/2016    Procedure: COLONOSCOPY WITH PROPOFOL;  Surgeon: Milus Banister, MD;  Location: [facility];  Service: Endoscopy;  Laterality: N/A;   JOINT REPLACEMENT   20060,2007    bilateral knees, Dr. Percell Miller    RADIOFREQUENCY ABLATION   02/2011    x 2   Scrotal Abcess        I/D - healed   TONSILLECTOMY       TOTAL KNEE REVISION   10/01/2011    Procedure: TOTAL KNEE REVISION;  Surgeon: Ninetta Lights, MD;  Location: [place];  Service: Orthopedics;  Laterality: Left;  left total knee revision, tibial and patellar components    Social History         Occupational History   Not on file  Tobacco Use   Smoking status: Never   Smokeless tobacco: Never  Vaping Use   Vaping Use: Never used  Substance and Sexual Activity   Alcohol use: Yes      Alcohol/week: 0.0 standard drinks      Comment: occ   Drug use: No   Sexual activity: Not on file

## 2021-07-08 ENCOUNTER — Encounter (HOSPITAL_COMMUNITY): Payer: Self-pay | Admitting: Orthopaedic Surgery

## 2021-07-08 NOTE — Anesthesia Postprocedure Evaluation (Signed)
Anesthesia Post Note  Patient: Denton Meek Francom  Procedure(s) Performed: LEFT ANKLE ARTHROSCOPY, DEBRIDEMENT OSTEOCHONDRAL DEFECT, partial synovectomy (Left: Ankle)     Patient location during evaluation: PACU Anesthesia Type: General Level of consciousness: awake and alert Pain management: pain level controlled Vital Signs Assessment: post-procedure vital signs reviewed and stable Respiratory status: spontaneous breathing, nonlabored ventilation, respiratory function stable and patient connected to nasal cannula oxygen Cardiovascular status: blood pressure returned to baseline and stable Postop Assessment: no apparent nausea or vomiting Anesthetic complications: no   No notable events documented.  Last Vitals:  Vitals:   07/05/21 0922 07/05/21 0937  BP: (!) 149/90 139/81  Pulse: 81 79  Resp: 13 14  Temp:  36.6 C  SpO2: 93% 95%    Last Pain:  Vitals:   07/05/21 0937  TempSrc:   PainSc: 0-No pain                 Tiajuana Amass

## 2021-07-10 ENCOUNTER — Encounter: Payer: Medicare HMO | Admitting: Surgery

## 2021-07-11 ENCOUNTER — Encounter: Payer: Self-pay | Admitting: Surgery

## 2021-07-11 ENCOUNTER — Ambulatory Visit (INDEPENDENT_AMBULATORY_CARE_PROVIDER_SITE_OTHER): Payer: Medicare HMO | Admitting: Surgery

## 2021-07-11 ENCOUNTER — Other Ambulatory Visit: Payer: Self-pay

## 2021-07-11 VITALS — Ht 73.0 in | Wt 382.0 lb

## 2021-07-11 DIAGNOSIS — M958 Other specified acquired deformities of musculoskeletal system: Secondary | ICD-10-CM

## 2021-07-11 NOTE — Progress Notes (Signed)
Post-Op Visit Note   Patient: Raymond Herrera           Date of Birth: 11-Mar-1956           MRN: [id number] Visit Date: 07/11/2021 PCP: [name], MD   Assessment & Plan:  Chief Complaint:  Chief Complaint  Patient presents with   Left Ankle - Routine Post Op  66 year old white male who is about a week status post left ankle arthroscopy with debridement returns.  States that he is doing well.  It has been difficult for him to get around nonweightbearing on left foot. Visit Diagnoses:  1. Osteochondral defect of ankle   Status post left ankle arthroscopy with debridement  Plan: Advised patient to elevate his foot above heart level is much as possible to help decrease swelling.  Since patient did not have microfracturing I think it is okay for him to start weightbearing as tolerated in a cam boot with a walker.  Patient states that he does have a boot at home.  He can begin some ankle range of motion exercises.  He will take aspirin 325 mg daily for DVT prophylaxis.  Follow-up with Dr. Lorin Mercy next Tuesday for wound check.  Follow-Up Instructions: Return in about 5 days (around 07/16/2021) for with dr yates for wound check.   Orders:  No orders of the defined types were placed in this encounter.  No orders of the defined types were placed in this encounter.   Imaging: No results found.  PMFS History: Patient Active Problem List   Diagnosis Date Noted   Essential hypertension 04/03/2021   Osteochondral defect of ankle 04/01/2021   Transient synovitis of left elbow 12/21/2019   Posterior tibial tendonitis, left 11/14/2019   Morbid obesity ([place]) 02/22/2019   Driving safety issue 04/27/2018   Colon cancer screening 02/15/2018   Benign neoplasm of transverse colon 12/09/2017   GAD (generalized anxiety disorder) 12/09/2017   HYPOGONADISM 06/12/2010   Obstructive sleep apnea 05/07/2010   Epigastric pain 09/17/2009   Low back pain 12/05/2008   Gastro-esophageal  reflux disease without esophagitis 11/15/2007   Palpitations 11/15/2007   Osteoarthritis 07/09/2007   Lumbar spondylosis with myelopathy 07/09/2007   Anxiety 02/01/2007   FASCIITIS, PLANTAR 02/01/2007   Personal history presenting hazards to health 02/01/2007   Past Medical History:  Diagnosis Date   Allergy    Anxiety    Arthritis    Both Knees   Chronic low back pain    had seen Dr. Rennis Harding, the Workers Comp case has been settled   Depression    Family history of adverse reaction to anesthesia    sister ponv   GERD (gastroesophageal reflux disease)    Hyperlipidemia    Hypogonadism male    sees Dr. Risa Grill    Plantar fasciitis    hx of   Pre-diabetes    Scrotal abscess 06/06/2010   MRSA, lanced by Dr. Risa Grill    Sleep apnea, obstructive    sees Dr. Gwenette Greet, uses CPAP  setting of 12    Family History  Problem Relation Age of Onset   Stroke Father    Anesthesia problems Neg Hx     Past Surgical History:  Procedure Laterality Date   ANKLE ARTHROSCOPY Left 07/05/2021   Procedure: LEFT ANKLE ARTHROSCOPY, DEBRIDEMENT OSTEOCHONDRAL DEFECT, partial synovectomy;  Surgeon: Marybelle Killings, MD;  Location: [facility];  Service: Orthopedics;  Laterality: Left;   APPENDECTOMY     BACK SURGERY  2016  Laminectomy   CARPAL TUNNEL RELEASE Left 02-14-14   per Dr. [name]    COLONOSCOPY  2008   clear, repeat in 10 yrs    COLONOSCOPY WITH PROPOFOL N/A 11/06/2016   Procedure: COLONOSCOPY WITH PROPOFOL;  Surgeon: Milus Banister, MD;  Location: [facility];  Service: Endoscopy;  Laterality: N/A;   JOINT REPLACEMENT  20060,2007   bilateral knees, Dr. Percell Miller    RADIOFREQUENCY ABLATION  02/2011   x 2   Scrotal Abcess     I/D - healed   TONSILLECTOMY     TOTAL KNEE REVISION  10/01/2011   Procedure: TOTAL KNEE REVISION;  Surgeon: Ninetta Lights, MD;  Location: [facility];  Service: Orthopedics;  Laterality: Left;  left total knee revision, tibial and patellar components   Social History    Occupational History   Not on file  Tobacco Use   Smoking status: Never   Smokeless tobacco: Never  Vaping Use   Vaping Use: Never used  Substance and Sexual Activity   Alcohol use: Yes    Alcohol/week: 6.0 standard drinks    Types: 6 Cans of beer per week   Drug use: No   Sexual activity: Not on file   Exam Pleasant male alert and oriented in no acute distress.  I do not remove ankle portal sutures.  Does have moderate swelling of the foot and ankle.  Calf nontender.  He does have slight serous drainage from his portals.  No signs of infection.

## 2021-07-16 ENCOUNTER — Telehealth: Payer: Self-pay | Admitting: Orthopaedic Surgery

## 2021-07-16 NOTE — Telephone Encounter (Signed)
Patient was seen last week for a post op f/u for his left ankle. He was instructed to be seen this week for another OV with Dr. Lorin Mercy but his schedule is full this week. Could you contact patient to schedule appointment this week?

## 2021-07-19 ENCOUNTER — Ambulatory Visit (INDEPENDENT_AMBULATORY_CARE_PROVIDER_SITE_OTHER): Payer: Medicare HMO | Admitting: Orthopaedic Surgery

## 2021-07-19 ENCOUNTER — Encounter: Payer: Self-pay | Admitting: Orthopaedic Surgery

## 2021-07-19 ENCOUNTER — Other Ambulatory Visit: Payer: Self-pay

## 2021-07-19 VITALS — BP 159/89 | HR 85 | Ht 73.0 in | Wt 382.0 lb

## 2021-07-19 DIAGNOSIS — M958 Other specified acquired deformities of musculoskeletal system: Secondary | ICD-10-CM

## 2021-07-19 NOTE — Progress Notes (Signed)
Post-Op Visit Note   Patient: Raymond Herrera           Date of Birth: 1956-03-18           MRN: [id number] Visit Date: 07/19/2021 PCP: [name], MD   Assessment & Plan: Postop left ankle arthroscopy.  He has an osteochondral defect but his ankle joint was too tight and area was not drilled.  His cartilage look good partial synovectomy was performed he states has been feeling much better.  He can progress with weightbearing using a walker and crutches.  Recheck 1 month.  He can continue the Voltaren gel over the ankle joint.  Chief Complaint:  Chief Complaint  Patient presents with   Left Ankle - Routine Post Op    07/05/2021 left ankle arthroscopy, debridement of osteochondral defect, partial synovectomy   Visit Diagnoses:  1. Osteochondral defect of ankle     Plan: ROV one month  Follow-Up Instructions: Return in about 1 month (around 08/16/2021).   Orders:  No orders of the defined types were placed in this encounter.  No orders of the defined types were placed in this encounter.   Imaging: No results found.  PMFS History: Patient Active Problem List   Diagnosis Date Noted   Essential hypertension 04/03/2021   Osteochondral defect of ankle 04/01/2021   Transient synovitis of left elbow 12/21/2019   Posterior tibial tendonitis, left 11/14/2019   Morbid obesity ([place]) 02/22/2019   Driving safety issue 04/27/2018   Colon cancer screening 02/15/2018   Benign neoplasm of transverse colon 12/09/2017   GAD (generalized anxiety disorder) 12/09/2017   HYPOGONADISM 06/12/2010   Obstructive sleep apnea 05/07/2010   Epigastric pain 09/17/2009   Low back pain 12/05/2008   Gastro-esophageal reflux disease without esophagitis 11/15/2007   Palpitations 11/15/2007   Osteoarthritis 07/09/2007   Lumbar spondylosis with myelopathy 07/09/2007   Anxiety 02/01/2007   FASCIITIS, PLANTAR 02/01/2007   Personal history presenting hazards to health 02/01/2007   Past  Medical History:  Diagnosis Date   Allergy    Anxiety    Arthritis    Both Knees   Chronic low back pain    had seen Dr. Rennis Harding, the Workers Comp case has been settled   Depression    Family history of adverse reaction to anesthesia    sister ponv   GERD (gastroesophageal reflux disease)    Hyperlipidemia    Hypogonadism male    sees Dr. Risa Grill    Plantar fasciitis    hx of   Pre-diabetes    Scrotal abscess 06/06/2010   MRSA, lanced by Dr. Risa Grill    Sleep apnea, obstructive    sees Dr. Gwenette Greet, uses CPAP  setting of 12    Family History  Problem Relation Age of Onset   Stroke Father    Anesthesia problems Neg Hx     Past Surgical History:  Procedure Laterality Date   ANKLE ARTHROSCOPY Left 07/05/2021   Procedure: LEFT ANKLE ARTHROSCOPY, DEBRIDEMENT OSTEOCHONDRAL DEFECT, partial synovectomy;  Surgeon: Marybelle Killings, MD;  Location: [facility];  Service: Orthopedics;  Laterality: Left;   APPENDECTOMY     BACK SURGERY  2016   Laminectomy   CARPAL TUNNEL RELEASE Left 02-14-14   per Dr. [name]    COLONOSCOPY  2008   clear, repeat in 10 yrs    COLONOSCOPY WITH PROPOFOL N/A 11/06/2016   Procedure: COLONOSCOPY WITH PROPOFOL;  Surgeon: Milus Banister, MD;  Location: [facility];  Service:  Endoscopy;  Laterality: N/A;   JOINT REPLACEMENT  20060,2007   bilateral knees, Dr. Percell Miller    RADIOFREQUENCY ABLATION  02/2011   x 2   Scrotal Abcess     I/D - healed   TONSILLECTOMY     TOTAL KNEE REVISION  10/01/2011   Procedure: TOTAL KNEE REVISION;  Surgeon: Ninetta Lights, MD;  Location: [place] [place];  Service: Orthopedics;  Laterality: Left;  left total knee revision, tibial and patellar components   Social History   Occupational History   Not on file  Tobacco Use   Smoking status: Never   Smokeless tobacco: Never  Vaping Use   Vaping Use: Never used  Substance and Sexual Activity   Alcohol use: Yes    Alcohol/week: 6.0 standard drinks    Types: 6 Cans of beer per week   Drug  use: No   Sexual activity: Not on file

## 2021-07-26 ENCOUNTER — Encounter: Payer: Medicare HMO | Admitting: Orthopaedic Surgery

## 2021-08-20 ENCOUNTER — Encounter: Payer: Self-pay | Admitting: Orthopaedic Surgery

## 2021-08-20 ENCOUNTER — Other Ambulatory Visit: Payer: Self-pay

## 2021-08-20 ENCOUNTER — Ambulatory Visit (INDEPENDENT_AMBULATORY_CARE_PROVIDER_SITE_OTHER): Payer: Medicare HMO | Admitting: Orthopaedic Surgery

## 2021-08-20 VITALS — BP 152/95 | HR 73 | Ht 73.0 in | Wt 379.0 lb

## 2021-08-20 DIAGNOSIS — M7542 Impingement syndrome of left shoulder: Secondary | ICD-10-CM | POA: Diagnosis not present

## 2021-08-20 DIAGNOSIS — M76822 Posterior tibial tendinitis, left leg: Secondary | ICD-10-CM

## 2021-08-20 NOTE — Progress Notes (Signed)
? ?Office Visit Note ?  ?Patient: Raymond Herrera           ?Date of Birth: January 15, 1956           ?MRN: [id number] ?Visit Date: 08/20/2021 ?             ?Requested by: [name], MD ?[facility] ?[place],  [place] [id number] ?PCP: Corrington, Delsa Grana, MD ? ? ?Assessment & Plan: ?Visit Diagnoses:  ?1. Posterior tibial tendonitis, left   ?2. Impingement syndrome of left shoulder   ? ? ?Plan: Patient is making progress post ankle arthroscopy.  Subacromial injection performed in his shoulder which gave him good relief.  He can follow-up if he has persistent symptoms. ? ?Follow-Up Instructions: No follow-ups on file.  ? ?Orders:  ?No orders of the defined types were placed in this encounter. ? ?No orders of the defined types were placed in this encounter. ? ? ? ? Procedures: ?Large Joint Inj: R subacromial bursa on 08/20/2021 10:47 AM ?Indications: pain ?Details: 22 G 1.5 in needle ? ?Arthrogram: No ? ?Medications: 4 mL bupivacaine 0.25 %; 40 mg methylPREDNISolone acetate 40 MG/ML; 0.5 mL lidocaine 1 % ?Outcome: tolerated well, no immediate complications ?Procedure, treatment alternatives, risks and benefits explained, specific risks discussed. Consent was given by the patient. Immediately prior to procedure a time out was called to verify the correct patient, procedure, equipment, support staff and site/side marked as required. Patient was prepped and draped in the usual sterile fashion.  ? ? ? ? ?Clinical Data: ?No additional findings. ? ? ?Subjective: ?Chief Complaint  ?Patient presents with  ? Left Ankle - Follow-up  ?  07/05/2021 left ankle scope, debridement of osteochondral defect, partial synovectomy  ? ? ?HPI follow-up ankle partial synovectomy.  He has been using crutches states his ankle is getting better.  He has had increased pain in his left shoulder with outstretched overhead activities and is requesting an injection.  He had previous history of shoulder surgery by Dr. Percell Miller.  He is used topical  rubs elevation. ? ?Review of Systems updated unchanged ? ? ?Objective: ?Vital Signs: BP (!) 152/95   Pulse 73   Ht '6\' 1"'$  (1.854 m)   Wt (!) 379 lb (171.9 kg)   BMI 50.00 kg/m?  ? ?Physical Exam pleasant impingement left shoulder.  Left ankle shows healed arthroscopic portals minimal swelling.  Mild tenderness of the posterior tibial tendon. ? ?Ortho Exam as above. ? ?Specialty Comments:  ?No specialty comments available. ? ?Imaging: ?No results found. ? ? ?PMFS History: ?Patient Active Problem List  ? Diagnosis Date Noted  ? Impingement syndrome of left shoulder 08/20/2021  ? Essential hypertension 04/03/2021  ? Osteochondral defect of ankle 04/01/2021  ? Transient synovitis of left elbow 12/21/2019  ? Posterior tibial tendonitis, left 11/14/2019  ? Morbid obesity ([place]) 02/22/2019  ? Driving safety issue 04/27/2018  ? Colon cancer screening 02/15/2018  ? Benign neoplasm of transverse colon 12/09/2017  ? GAD (generalized anxiety disorder) 12/09/2017  ? HYPOGONADISM 06/12/2010  ? Obstructive sleep apnea 05/07/2010  ? Epigastric pain 09/17/2009  ? Low back pain 12/05/2008  ? Gastro-esophageal reflux disease without esophagitis 11/15/2007  ? Palpitations 11/15/2007  ? Osteoarthritis 07/09/2007  ? Lumbar spondylosis with myelopathy 07/09/2007  ? Anxiety 02/01/2007  ? [place], [place] 02/01/2007  ? Personal history presenting hazards to health 02/01/2007  ? ?Past Medical History:  ?Diagnosis Date  ? Allergy   ? Anxiety   ? Arthritis   ?  Both Knees  ? Chronic low back pain   ? had seen Dr. Rennis Harding, the Workers Comp case has been settled  ? Depression   ? Family history of adverse reaction to anesthesia   ? sister ponv  ? GERD (gastroesophageal reflux disease)   ? Hyperlipidemia   ? Hypogonadism male   ? sees Dr. Risa Grill   ? Plantar fasciitis   ? hx of  ? Pre-diabetes   ? Scrotal abscess 06/06/2010  ? MRSA, lanced by Dr. Risa Grill   ? Sleep apnea, obstructive   ? sees Dr. Gwenette Greet, uses CPAP  setting of 12  ?  ?Family  History  ?Problem Relation Age of Onset  ? Stroke Father   ? Anesthesia problems Neg Hx   ?  ?Past Surgical History:  ?Procedure Laterality Date  ? ANKLE ARTHROSCOPY Left 07/05/2021  ? Procedure: LEFT ANKLE ARTHROSCOPY, DEBRIDEMENT OSTEOCHONDRAL DEFECT, partial synovectomy;  Surgeon: Marybelle Killings, MD;  Location: [facility];  Service: Orthopedics;  Laterality: Left;  ? APPENDECTOMY    ? BACK SURGERY  2016  ? Laminectomy  ? CARPAL TUNNEL RELEASE Left 02-14-14  ? per Dr. [name]   ? COLONOSCOPY  2008  ? clear, repeat in 10 yrs   ? COLONOSCOPY WITH PROPOFOL N/A 11/06/2016  ? Procedure: COLONOSCOPY WITH PROPOFOL;  Surgeon: Milus Banister, MD;  [facility];  Service: Endoscopy;  Laterality: N/A;  ? JOINT REPLACEMENT  20060,2007  ? bilateral knees, Dr. Percell Miller   ? RADIOFREQUENCY ABLATION  02/2011  ? x 2  ? Scrotal Abcess    ? I/D - healed  ? TONSILLECTOMY    ? TOTAL KNEE REVISION  10/01/2011  ? Procedure: TOTAL KNEE REVISION;  Surgeon: Ninetta Lights, MD;  Location: [facility];  Service: Orthopedics;  Laterality: Left;  left total knee revision, tibial and patellar components  ? ?Social History  ? ?Occupational History  ? Not on file  ?Tobacco Use  ? Smoking status: Never  ? Smokeless tobacco: Never  ?Vaping Use  ? Vaping Use: Never used  ?Substance and Sexual Activity  ? Alcohol use: Yes  ?  Alcohol/week: 6.0 standard drinks  ?  Types: 6 Cans of beer per week  ? Drug use: No  ? Sexual activity: Not on file  ? ? ? ? ? ? ?

## 2021-08-23 MED ORDER — LIDOCAINE HCL 1 % IJ SOLN
0.5000 mL | INTRAMUSCULAR | Status: AC | PRN
  Administered 2021-08-20: .5 mL

## 2021-08-23 MED ORDER — METHYLPREDNISOLONE ACETATE 40 MG/ML IJ SUSP
40.0000 mg | INTRAMUSCULAR | Status: AC | PRN
  Administered 2021-08-20: 40 mg via INTRA_ARTICULAR

## 2021-08-23 MED ORDER — BUPIVACAINE HCL 0.25 % IJ SOLN
4.0000 mL | INTRAMUSCULAR | Status: AC | PRN
  Administered 2021-08-20: 4 mL via INTRA_ARTICULAR

## 2021-08-28 ENCOUNTER — Telehealth: Payer: Self-pay | Admitting: Pulmonary Disease

## 2021-08-29 NOTE — Telephone Encounter (Signed)
Called and spoke with patient. He stated that he received a call from [employer] stating that it was time for a new cpap machine. His current machine is still working but its over 65 years old. He would like to have an order sent to [employer].  ? ?I explained to him that since he hasn't been seen since 2021 he would need an OV before any orders would be placed. He has been scheduled for 09/02/21 at 10am.  ? ?Nothing further needed at time of call.  ?

## 2021-09-02 ENCOUNTER — Encounter: Payer: Self-pay | Admitting: Pulmonary Disease

## 2021-09-02 ENCOUNTER — Ambulatory Visit (INDEPENDENT_AMBULATORY_CARE_PROVIDER_SITE_OTHER): Payer: Medicare HMO | Admitting: Pulmonary Disease

## 2021-09-02 VITALS — BP 162/90 | HR 83 | Temp 98.4°F | Ht 73.0 in | Wt 390.0 lb

## 2021-09-02 DIAGNOSIS — Z9989 Dependence on other enabling machines and devices: Secondary | ICD-10-CM

## 2021-09-02 DIAGNOSIS — G4733 Obstructive sleep apnea (adult) (pediatric): Secondary | ICD-10-CM

## 2021-09-02 NOTE — Patient Instructions (Signed)
DME referral for new machine ? ?Currently on a CPAP of 15 with heated humidification ? ?We can keep your follow-up appointment at a year ? ?You are welcome to call us with any significant concerns ? ?Continue using machine nightly as you are ?

## 2021-09-02 NOTE — Progress Notes (Signed)
? ?      Raymond Herrera    [id number]    1955/08/13 ? ?Primary Care Physician:Corrington, Kip A, MD ? ?Referring Physician: [name], MD ?[place] ?[place],  [place] [address] ? ?Chief complaint:   ?Patient with a history of obstructive sleep apnea ? ?Compliant with CPAP ?Machine is dated and does not appear to be working as optimally ? ?HPI: ?Continues to use CPAP nightly ? ?Benefiting from CPAP use ? ?Machine is dated at least about 10 years ? ?Has noticed that the pressure is not as effective as it was before ? ?Sleeping well ?Good energy levels ?No significant change in his health since the last visit ?Tolerated foot surgery well about 5 6 weeks ago ? ?Last sleep study was in 2011 ?In 2012 was started on an auto titrating setting, use the machine for about 4 to 6 weeks to try and determine what pressure is required, machine is currently set on a fixed pressure of 15 ? ?Usually goes to bed about 10 PM, about 1 awakening, final wake up time 8 AM ? ?Experiences dryness ?No headache ? ?Weight has remained stable ? ?Outpatient Encounter Medications as of 09/02/2021  ?Medication Sig  ? aspirin 325 MG tablet Take 650 mg by mouth every 6 (six) hours as needed for moderate pain.  ? BD DISP NEEDLES 18G X 1-1/2" MISC   ? celecoxib (CELEBREX) 200 MG capsule Take 1 capsule (200 mg total) by mouth 2 (two) times daily. (Patient taking differently: Take 200 mg by mouth daily as needed for moderate pain.)  ? diclofenac Sodium (VOLTAREN) 1 % GEL Apply 1 application topically 4 (four) times daily as needed (pain).  ? EPINEPHrine (EPIPEN 2-PAK) 0.3 mg/0.3 mL SOAJ Inject 0.3 mLs (0.3 mg total) into the muscle once.  ? esomeprazole (NEXIUM) 40 MG capsule take twice a day (Patient taking differently: Take 40 mg by mouth See admin instructions. Take 40 mg daily, may take a second 40 mg dose as needed for acid reflux)  ? fluticasone (FLONASE) 50 MCG/ACT nasal spray USE 2 SPRAYS IN EACH NOSTRIL ONCE DAILY  (Patient taking differently: Place 2 sprays into both nostrils daily as needed for allergies.)  ? LORazepam (ATIVAN) 2 MG tablet Take 2 mg by mouth in the morning and at bedtime.  ? methocarbamol (ROBAXIN) 500 MG tablet Take 1 tablet (500 mg total) by mouth 4 (four) times daily. (Patient taking differently: Take 500 mg by mouth 3 (three) times daily as needed for muscle spasms.)  ? naloxone (NARCAN) nasal spray 4 mg/0.1 mL Place 1 spray into the nose as needed (opioid overdose).  ? Naphazoline HCl (CLEAR EYES OP) Place 1 drop into both eyes daily as needed (redness).  ? NEEDLE, DISP, 18 G (BD DISP NEEDLES) 18G X 1-1/2" MISC   ? Nutritional Supplements (JUICE PLUS FIBRE PO) Take 6 capsules by mouth daily. Take 2 Fruits + 2 Vegetables + 2 Berry  ? Oxycodone HCl 10 MG TABS Take 1 tablet (10 mg total) by mouth every 4 (four) hours as needed (pain).  ? testosterone cypionate (DEPOTESTOSTERONE CYPIONATE) 200 MG/ML injection Inject 100 mg into the muscle every Friday.  ? ?No facility-administered encounter medications on file as of 09/02/2021.  ? ? ?Allergies as of 09/02/2021 - Review Complete 09/02/2021  ?Allergen Reaction Noted  ? Bee venom Anaphylaxis and Swelling 10/01/2011  ? Pregabalin Swelling 04/24/2015  ? Tramadol Itching 07/20/2015  ? ? ?Past Medical History:  ?Diagnosis Date  ?  Allergy   ? Anxiety   ? Arthritis   ? Both Knees  ? Chronic low back pain   ? had seen Dr. Rennis Harding, the Workers Comp case has been settled  ? Depression   ? Family history of adverse reaction to anesthesia   ? sister ponv  ? GERD (gastroesophageal reflux disease)   ? Hyperlipidemia   ? Hypogonadism male   ? sees Dr. Risa Grill   ? Plantar fasciitis   ? hx of  ? Pre-diabetes   ? Scrotal abscess 06/06/2010  ? MRSA, lanced by Dr. Risa Grill   ? Sleep apnea, obstructive   ? sees Dr. Gwenette Greet, uses CPAP  setting of 12  ? ? ?Past Surgical History:  ?Procedure Laterality Date  ? ANKLE ARTHROSCOPY Left 07/05/2021  ? Procedure: LEFT ANKLE ARTHROSCOPY,  DEBRIDEMENT OSTEOCHONDRAL DEFECT, partial synovectomy;  Surgeon: Marybelle Killings, MD;  Location: [facility];  Service: Orthopedics;  Laterality: Left;  ? APPENDECTOMY    ? BACK SURGERY  2016  ? Laminectomy  ? CARPAL TUNNEL RELEASE Left 02-14-14  ? per Dr. [name]   ? COLONOSCOPY  2008  ? clear, repeat in 10 yrs   ? COLONOSCOPY WITH PROPOFOL N/A 11/06/2016  ? Procedure: COLONOSCOPY WITH PROPOFOL;  Surgeon: Milus Banister, MD;  Location: [facility];  Service: Endoscopy;  Laterality: N/A;  ? JOINT REPLACEMENT  20060,2007  ? bilateral knees, Dr. Percell Miller   ? RADIOFREQUENCY ABLATION  02/2011  ? x 2  ? Scrotal Abcess    ? I/D - healed  ? TONSILLECTOMY    ? TOTAL KNEE REVISION  10/01/2011  ? Procedure: TOTAL KNEE REVISION;  Surgeon: Ninetta Lights, MD;  Location: [facility];  Service: Orthopedics;  Laterality: Left;  left total knee revision, tibial and patellar components  ? ? ?Family History  ?Problem Relation Age of Onset  ? Stroke Father   ? Anesthesia problems Neg Hx   ? ? ?Social History  ? ?Socioeconomic History  ? Marital status: Married  ?  Spouse name: Not on file  ? Number of children: Not on file  ? Years of education: Not on file  ? Highest education level: Not on file  ?Occupational History  ? Not on file  ?Tobacco Use  ? Smoking status: Never  ? Smokeless tobacco: Never  ?Vaping Use  ? Vaping Use: Never used  ?Substance and Sexual Activity  ? Alcohol use: Yes  ?  Alcohol/week: 6.0 standard drinks  ?  Types: 6 Cans of beer per week  ? Drug use: No  ? Sexual activity: Not on file  ?Other Topics Concern  ? Not on file  ?Social History Narrative  ? Not on file  ? ?Social Determinants of Health  ? ?Financial Resource Strain: Not on file  ?Food Insecurity: Not on file  ?Transportation Needs: Not on file  ?Physical Activity: Not on file  ?Stress: Not on file  ?Social Connections: Not on file  ?Intimate Partner Violence: Not on file  ? ? ?Review of Systems  ?Constitutional:  Positive for activity change.  ?Respiratory:   Positive for apnea.   ?Psychiatric/Behavioral:  Positive for sleep disturbance.   ? ?Vitals:  ? 09/02/21 1000  ?BP: (!) 162/90  ?Pulse: 83  ?Temp: 98.4 ?F (36.9 ?C)  ?SpO2: 96%  ? ? ? ?Physical Exam ?Constitutional:   ?   Appearance: He is obese.  ?HENT:  ?   Head: Normocephalic.  ?   Mouth/Throat:  ?  Comments: Mallampati 4, crowded oropharynx ?Neck:  ?   Comments: Neck is thick ?Cardiovascular:  ?   Rate and Rhythm: Normal rate.  ?   Pulses: Normal pulses.  ?   Heart sounds: Normal heart sounds. No murmur heard. ?  No friction rub.  ?Pulmonary:  ?   Effort: Pulmonary effort is normal. No respiratory distress.  ?   Breath sounds: No stridor. No wheezing or rhonchi.  ?Musculoskeletal:  ?   Cervical back: No rigidity or tenderness.  ?Skin: ?   General: Skin is warm.  ?Neurological:  ?   General: No focal deficit present.  ?   Mental Status: He is alert.  ?Psychiatric:     ?   Mood and Affect: Mood normal.  ? ?Data Reviewed: ?Patient remains on CPAP of 15 ? ?100% compliance with CPAP ?No significant leaks risk ?Residual AHI of 0.5 ? ?Assessment:  ? ?Severe obstructive sleep apnea ?-Tolerating pressure of 15 ?-Compliance shows excellent Compliance ?-Tolerating pressure well ? ?Machine is dated and pressure appears suboptimal compared to previously ? ?No pressure changes needed at present ? ?Need to contact DME for new machine ? ?Obesity ? ?Plan/Recommendations: ? ?Continue current CPAP pressures ? ?DME referral for new machine ? ?We will make an appointment for about a year ? ?Encouraged to give [place] a call if any other significant concerns ? ?Importance of continuing with CPAP on a nightly basis discussed ? ?Sherrilyn Rist MD ?[facility] ?09/02/2021, 10:09 AM ? ?CC: Corrington, Kip A, MD ? ? ?

## 2021-10-02 ENCOUNTER — Ambulatory Visit: Payer: Medicare HMO | Admitting: Podiatry

## 2021-10-02 DIAGNOSIS — L600 Ingrowing nail: Secondary | ICD-10-CM

## 2021-10-02 DIAGNOSIS — R52 Pain, unspecified: Secondary | ICD-10-CM | POA: Diagnosis not present

## 2021-10-02 MED ORDER — GENTAMICIN SULFATE 0.1 % EX CREA: 1.0000 "application " | TOPICAL_CREAM | Freq: Two times a day (BID) | CUTANEOUS | 1 refills | Status: AC

## 2021-10-02 NOTE — Progress Notes (Signed)
? ?  Subjective: ?Patient presents today for evaluation of a new complaint of pain to the medial border left great toe. Patient is concerned for possible recurrent ingrown nail.  It is very sensitive to touch.  Patient had partial nail matricectomy to the medial border of the left great toe about 4 months ago in January 2023.  He states that he was doing well up until about a week ago when he noticed increased swelling and sensitivity to the medial border of the left great toe.  He has not done anything currently for treatment.  He presents for further treatment and evaluation ? ?Past Medical History:  ?Diagnosis Date  ? Allergy   ? Anxiety   ? Arthritis   ? Both Knees  ? Chronic low back pain   ? had seen Dr. Rennis Harding, the Workers Comp case has been settled  ? Depression   ? Family history of adverse reaction to anesthesia   ? sister ponv  ? GERD (gastroesophageal reflux disease)   ? Hyperlipidemia   ? Hypogonadism male   ? sees Dr. Risa Grill   ? Plantar fasciitis   ? hx of  ? Pre-diabetes   ? Scrotal abscess 06/06/2010  ? MRSA, lanced by Dr. Risa Grill   ? Sleep apnea, obstructive   ? sees Dr. Gwenette Greet, uses CPAP  setting of 12  ? ? ?Objective:  ?General: Well developed, nourished, in no acute distress, alert and oriented x3  ? ?Dermatology: Skin is warm, dry and supple bilateral.  Medial border left great toe appears to be erythematous with evidence of an ingrowing nail. Pain on palpation noted to the border of the nail fold. The remaining nails appear unremarkable at this time. There are no open sores, lesions. ? ?Vascular: Dorsalis Pedis artery and Posterior Tibial artery pedal pulses palpable. No lower extremity edema noted.  ? ?Neruologic: Grossly intact via light touch bilateral. ? ?Musculoskeletal: Muscular strength within normal limits in all groups bilateral. Normal range of motion noted to all pedal and ankle joints.  ? ?Assesement: ?#1 residual paronychia with ingrowing nail medial border left great toe ?#2 Pain  in toe ? ?Plan of Care:  ?1. Patient evaluated.  ?2. Discussed treatment alternatives and plan of care. Explained nail avulsion procedure and post procedure course to patient. ?3. Patient opted for permanent partial nail avulsion of the ingrown portion of the nail.  The same procedure that was performed back in January ?4. Prior to procedure, local anesthesia infiltration utilized using 3 ml of a 50:50 mixture of 2% plain lidocaine and 0.5% plain marcaine in a normal hallux block fashion and a betadine prep performed.  ?5. Partial permanent nail avulsion with chemical matrixectomy performed using [id number] applications of phenol followed by alcohol flush.  ?6. Light dressing applied.  Post care instructions provided ?7.  Prescription for gentamicin 2% cream  ?8.  Return to clinic 2 weeks. ? ?Edrick Kins, DPM ?[facility] ? ?Dr. Edrick Kins, DPM  ?  ?[address]                                       ?[place], [place] [address]                ?Office [phone]  ?Fax [phone] ? ? ? ? ?

## 2021-10-29 ENCOUNTER — Encounter: Payer: Self-pay | Admitting: Pulmonary Disease

## 2021-10-29 ENCOUNTER — Ambulatory Visit (INDEPENDENT_AMBULATORY_CARE_PROVIDER_SITE_OTHER): Payer: Medicare HMO | Admitting: Pulmonary Disease

## 2021-10-29 VITALS — BP 124/76 | HR 90 | Temp 97.6°F | Ht 71.0 in | Wt 390.6 lb

## 2021-10-29 DIAGNOSIS — G4733 Obstructive sleep apnea (adult) (pediatric): Secondary | ICD-10-CM

## 2021-10-29 DIAGNOSIS — Z9989 Dependence on other enabling machines and devices: Secondary | ICD-10-CM | POA: Diagnosis not present

## 2021-10-29 NOTE — Patient Instructions (Addendum)
I will see you a year from here  Continue using CPAP on a nightly basis  Call with significant concerns

## 2021-10-29 NOTE — Progress Notes (Signed)
Raymond Herrera    [id number]    03-04-1956  Primary Care Physician:Corrington, Delsa Grana, MD  Referring Physician: [name], MD [place],  [place] [id number]  Chief complaint:   Patient with a history of obstructive sleep apnea  Compliant with CPAP Did receive a new machine  HPI: Continues to use CPAP nightly  Continues to benefit from CPAP use  Has been getting alerts regarding mask leaks  The leaks do not seem to wake him up from sleep  Still wakes up in the morning feeling like he is at a good nights rest  Sleeping well Good energy levels No significant change in his health since the last visit  Last sleep study was in 2011 In 2012 was started on an auto titrating setting, use the machine for about 4 to 6 weeks to try and determine what pressure is required, machine is currently set on a fixed pressure of 15  Usually goes to bed about 10 PM, about 1 awakening, final wake up time 8 AM  Experiences dryness No headache  Weight has remained stable  Outpatient Encounter Medications as of 10/29/2021  Medication Sig   aspirin 325 MG tablet Take 650 mg by mouth every 6 (six) hours as needed for moderate pain.   BD DISP NEEDLES 18G X 1-1/2" MISC    celecoxib (CELEBREX) 200 MG capsule Take 1 capsule (200 mg total) by mouth 2 (two) times daily. (Patient taking differently: Take 200 mg by mouth daily as needed for moderate pain.)   diclofenac Sodium (VOLTAREN) 1 % GEL Apply 1 application topically 4 (four) times daily as needed (pain).   EPINEPHrine (EPIPEN 2-PAK) 0.3 mg/0.3 mL SOAJ Inject 0.3 mLs (0.3 mg total) into the muscle once.   esomeprazole (NEXIUM) 40 MG capsule take twice a day (Patient taking differently: Take 40 mg by mouth See admin instructions. Take 40 mg daily, may take a second 40 mg dose as needed for acid reflux)   fluticasone (FLONASE) 50 MCG/ACT nasal spray USE 2 SPRAYS IN EACH NOSTRIL ONCE DAILY (Patient taking  differently: Place 2 sprays into both nostrils daily as needed for allergies.)   gentamicin cream (GARAMYCIN) 0.1 % Apply 1 application. topically 2 (two) times daily.   LORazepam (ATIVAN) 2 MG tablet Take 2 mg by mouth in the morning and at bedtime.   methocarbamol (ROBAXIN) 500 MG tablet Take 1 tablet (500 mg total) by mouth 4 (four) times daily. (Patient taking differently: Take 500 mg by mouth 3 (three) times daily as needed for muscle spasms.)   naloxone (NARCAN) nasal spray 4 mg/0.1 mL Place 1 spray into the nose as needed (opioid overdose).   Naphazoline HCl (CLEAR EYES OP) Place 1 drop into both eyes daily as needed (redness).   NEEDLE, DISP, 18 G (BD DISP NEEDLES) 18G X 1-1/2" MISC    Nutritional Supplements (JUICE PLUS FIBRE PO) Take 6 capsules by mouth daily. Take 2 Fruits + 2 Vegetables + 2 Berry   Oxycodone HCl 10 MG TABS Take 1 tablet (10 mg total) by mouth every 4 (four) hours as needed (pain).   testosterone cypionate (DEPOTESTOSTERONE CYPIONATE) 200 MG/ML injection Inject 100 mg into the muscle every Friday.   No facility-administered encounter medications on file as of 10/29/2021.    Allergies as of 10/29/2021 - Review Complete 10/29/2021  Allergen Reaction Noted   Bee venom Anaphylaxis and Swelling 10/01/2011   Pregabalin Swelling 04/24/2015  Tramadol Itching 07/20/2015    Past Medical History:  Diagnosis Date   Allergy    Anxiety    Arthritis    Both Knees   Chronic low back pain    had seen Dr. Rennis Harding, the Workers Comp case has been settled   Depression    Family history of adverse reaction to anesthesia    sister ponv   GERD (gastroesophageal reflux disease)    Hyperlipidemia    Hypogonadism male    sees Dr. Risa Grill    Plantar fasciitis    hx of   Pre-diabetes    Scrotal abscess 06/06/2010   MRSA, lanced by Dr. Risa Grill    Sleep apnea, obstructive    sees Dr. Gwenette Greet, uses CPAP  setting of 12    Past Surgical History:  Procedure Laterality Date    ANKLE ARTHROSCOPY Left 07/05/2021   Procedure: LEFT ANKLE ARTHROSCOPY, DEBRIDEMENT OSTEOCHONDRAL DEFECT, partial synovectomy;  Surgeon: Marybelle Killings, MD;  Location: [facility];  Service: Orthopedics;  Laterality: Left;   APPENDECTOMY     BACK SURGERY  2016   Laminectomy   CARPAL TUNNEL RELEASE Left 02-14-14   per Dr. [name]    COLONOSCOPY  2008   clear, repeat in 10 yrs    COLONOSCOPY WITH PROPOFOL N/A 11/06/2016   Procedure: COLONOSCOPY WITH PROPOFOL;  Surgeon: Milus Banister, MD;  Location: [facility];  Service: Endoscopy;  Laterality: N/A;   JOINT REPLACEMENT  20060,2007   bilateral knees, Dr. Percell Miller    RADIOFREQUENCY ABLATION  02/2011   x 2   Scrotal Abcess     I/D - healed   TONSILLECTOMY     TOTAL KNEE REVISION  10/01/2011   Procedure: TOTAL KNEE REVISION;  Surgeon: Ninetta Lights, MD;  Location: [facility];  Service: Orthopedics;  Laterality: Left;  left total knee revision, tibial and patellar components    Family History  Problem Relation Age of Onset   Stroke Father    Anesthesia problems Neg Hx     Social History   Socioeconomic History   Marital status: Married    Spouse name: Not on file   Number of children: Not on file   Years of education: Not on file   Highest education level: Not on file  Occupational History   Not on file  Tobacco Use   Smoking status: Never   Smokeless tobacco: Never  Vaping Use   Vaping Use: Never used  Substance and Sexual Activity   Alcohol use: Yes    Alcohol/week: 6.0 standard drinks    Types: 6 Cans of beer per week   Drug use: No   Sexual activity: Not on file  Other Topics Concern   Not on file  Social History Narrative   Not on file   Social Determinants of Health   Financial Resource Strain: Not on file  Food Insecurity: Not on file  Transportation Needs: Not on file  Physical Activity: Not on file  Stress: Not on file  Social Connections: Not on file  Intimate Partner Violence: Not on file    Review of Systems   Constitutional:  Positive for activity change.  Respiratory:  Positive for apnea.   Psychiatric/Behavioral:  Positive for sleep disturbance.    Vitals:   10/29/21 1150  BP: 124/76  Pulse: 90  Temp: 97.6 F (36.4 C)  SpO2: 94%     Physical Exam Constitutional:      Appearance: He is obese.  HENT:  Head: Normocephalic.     Mouth/Throat:     Comments: Mallampati 4, crowded oropharynx Neck:     Comments: Neck is thick Cardiovascular:     Rate and Rhythm: Normal rate.     Pulses: Normal pulses.     Heart sounds: Normal heart sounds. No murmur heard.   No friction rub.  Pulmonary:     Effort: Pulmonary effort is normal. No respiratory distress.     Breath sounds: No stridor. No wheezing or rhonchi.  Musculoskeletal:     Cervical back: No rigidity or tenderness.  Skin:    General: Skin is warm.  Neurological:     General: No focal deficit present.     Mental Status: He is alert.  Psychiatric:        Mood and Affect: Mood normal.   Data Reviewed: Patient remains on CPAP of 15  100% compliance with CPAP Having some issues with mask leaks Residual AHI of 0. 3  Assessment:   Severe obstructive sleep apnea -Tolerating pressure of 15 -Compliance shows excellent Compliance -Tolerating pressure well  Machine is dated and pressure appears suboptimal compared to previously  No pressure changes needed at present  Need to contact DME for new machine  Obesity  Plan/Recommendations:  Continue current CPAP settings  Follow-up in a year  Encouraged to call with concerns  Importance of continuing with CPAP on a nightly basis discussed  Sherrilyn Rist MD [facility] 10/29/2021, 11:56 AM  CC: Corrington, Kip A, MD

## 2022-02-19 ENCOUNTER — Other Ambulatory Visit: Payer: Self-pay | Admitting: Orthopaedic Surgery

## 2022-02-19 DIAGNOSIS — M5106 Intervertebral disc disorders with myelopathy, lumbar region: Secondary | ICD-10-CM

## 2022-03-11 ENCOUNTER — Ambulatory Visit
Admission: RE | Admit: 2022-03-11 | Discharge: 2022-03-11 | Disposition: A | Discharge status: Home / Self Care | Payer: Medicare HMO | Source: Ambulatory Visit | Attending: Orthopaedic Surgery | Admitting: Orthopaedic Surgery

## 2022-03-11 DIAGNOSIS — M5106 Intervertebral disc disorders with myelopathy, lumbar region: Secondary | ICD-10-CM

## 2022-03-11 MED ORDER — GADOPICLENOL 0.5 MMOL/ML IV SOLN
10.0000 mL | Freq: Once | INTRAVENOUS | Status: AC | PRN
  Administered 2022-03-11: 10 mL via INTRAVENOUS

## 2022-11-24 ENCOUNTER — Ambulatory Visit (INDEPENDENT_AMBULATORY_CARE_PROVIDER_SITE_OTHER): Payer: PPO | Admitting: Orthopaedic Surgery

## 2022-11-24 ENCOUNTER — Other Ambulatory Visit (INDEPENDENT_AMBULATORY_CARE_PROVIDER_SITE_OTHER): Payer: PPO

## 2022-11-24 ENCOUNTER — Encounter: Payer: Self-pay | Admitting: Orthopaedic Surgery

## 2022-11-24 VITALS — BP 143/80 | HR 69 | Ht 75.0 in | Wt 381.0 lb

## 2022-11-24 DIAGNOSIS — M7062 Trochanteric bursitis, left hip: Secondary | ICD-10-CM

## 2022-11-24 DIAGNOSIS — M25552 Pain in left hip: Secondary | ICD-10-CM

## 2022-11-24 NOTE — Progress Notes (Unsigned)
Office Visit Note   Patient: Raymond Herrera           Date of Birth: May 15, 1956           MRN: [id number] Visit Date: 11/24/2022              Requested by: Vivien Presto, MD [phone] [place],  [place] [address] PCP: Vivien Presto, MD   Assessment & Plan: Visit Diagnoses:  1. Pain in left hip   2. Trochanteric bursitis, left hip     Plan: Trochanteric injection performed which she tolerated well.  He will continue to work on weight loss we can hopefully get his BMI to 44 he starts having severe hip pain and needs total of arthroplasty.  Return as needed.  Follow-Up Instructions: No follow-ups on file.   Orders:  Orders Placed This Encounter  Procedures   Large Joint Inj: L greater trochanter   XR HIP UNILAT W OR W/O PELVIS 2-3 VIEWS LEFT   Meds ordered this encounter  Medications   bupivacaine (MARCAINE) 0.25 % (with pres) injection 2 mL   lidocaine (XYLOCAINE) 1 % (with pres) injection 1 mL   methylPREDNISolone acetate (DEPO-MEDROL) injection 40 mg      Procedures: Large Joint Inj: L greater trochanter on 11/25/2022 11:59 AM Details: lateral approach Medications: 1 mL lidocaine 1 %; 2 mL bupivacaine 0.25 %; 40 mg methylPREDNISolone acetate 40 MG/ML      Clinical Data: No additional findings.   Subjective: Chief Complaint  Patient presents with   Left Hip - Pain    HPI 67 year old male returns with recurrent trochanteric pain on the left.  History of injection by Dr. Eulah Pont many years ago.  He had both knees replaced by Dr. Eulah Pont.  Broke the poly on 1 knee that had to be revised.  Opposite right knee has slight valgus but has not really symptomatic.  Patient had ankle arthroscopy by me with debridement which helped his ankle.  Pain is laterally over the hip radiates from the trochanter down to the mid thigh causing him to limp at times.  He is on chronic pain management unchanged.  He has had the intra-articular injection under fluoroscopy  for his hip osteoarthritis on the left which gave him some temporary relief.  Review of Systems 14 point system update unchanged.   Objective: Vital Signs: BP (!) 143/80   Pulse 69   Ht 6\' 3"  (1.905 m)   Wt (!) 381 lb (172.8 kg)   BMI 47.62 kg/m   Physical Exam Constitutional:      Appearance: He is well-developed.  HENT:     Head: Normocephalic and atraumatic.     Right Ear: External ear normal.     Left Ear: External ear normal.  Eyes:     Pupils: Pupils are equal, round, and reactive to light.  Neck:     Thyroid: No thyromegaly.     Trachea: No tracheal deviation.  Cardiovascular:     Rate and Rhythm: Normal rate.  Pulmonary:     Effort: Pulmonary effort is normal.     Breath sounds: No wheezing.  Abdominal:     General: Bowel sounds are normal.     Palpations: Abdomen is soft.  Musculoskeletal:     Cervical back: Neck supple.  Skin:    General: Skin is warm and dry.     Capillary Refill: Capillary refill takes less than 2 seconds.  Neurological:  Mental Status: He is alert and oriented to person, place, and time.  Psychiatric:        Behavior: Behavior normal.        Thought Content: Thought content normal.        Judgment: Judgment normal.     Ortho Exam exquisite tenderness over the trochanters well-healed right left total knee arthroplasty mild right knee valgus.  Healed total knee arthroplasty incisions.  Mild discomfort internal rotation left hip not severe.  Opposite right hip as good internal and external rotation.  Specialty Comments:  No specialty comments available.  Imaging: XR HIP UNILAT W OR W/O PELVIS 2-3 VIEWS LEFT  Result Date: 11/25/2022 AP pelvis frog-leg right hip obtained and reviewed this shows some joint space narrowing more marginal osteophytes.  Body habitus obscures some radiographic clarity.  Patient has some hip osteoarthritis minimal to moderate. Impression: Left hip moderate osteoarthritis.    PMFS History: Patient Active  Problem List   Diagnosis Date Noted   Trochanteric bursitis, left hip 11/25/2022   Impingement syndrome of left shoulder 08/20/2021   Essential hypertension 04/03/2021   Osteochondral defect of ankle 04/01/2021   Transient synovitis of left elbow 12/21/2019   Posterior tibial tendonitis, left 11/14/2019   Morbid obesity (HCC) 02/22/2019   Driving safety issue 16/02/9603   Colon cancer screening 02/15/2018   Benign neoplasm of transverse colon 12/09/2017   GAD (generalized anxiety disorder) 12/09/2017   HYPOGONADISM 06/12/2010   Obstructive sleep apnea 05/07/2010   Epigastric pain 09/17/2009   Low back pain 12/05/2008   Gastro-esophageal reflux disease without esophagitis 11/15/2007   Palpitations 11/15/2007   Osteoarthritis 07/09/2007   Lumbar spondylosis with myelopathy 07/09/2007   Anxiety 02/01/2007   FASCIITIS, PLANTAR 02/01/2007   Personal history presenting hazards to health 02/01/2007   Past Medical History:  Diagnosis Date   Allergy    Anxiety    Arthritis    Both Knees   Chronic low back pain    had seen Dr. Sharolyn Douglas, the Workers Comp case has been settled   Depression    Family history of adverse reaction to anesthesia    sister ponv   GERD (gastroesophageal reflux disease)    Hyperlipidemia    Hypogonadism male    sees Dr. Isabel Caprice    Plantar fasciitis    hx of   Pre-diabetes    Scrotal abscess 06/06/2010   MRSA, lanced by Dr. Isabel Caprice    Sleep apnea, obstructive    sees Dr. Shelle Iron, uses CPAP  setting of 12    Family History  Problem Relation Age of Onset   Stroke Father    Anesthesia problems Neg Hx     Past Surgical History:  Procedure Laterality Date   ANKLE ARTHROSCOPY Left 07/05/2021   Procedure: LEFT ANKLE ARTHROSCOPY, DEBRIDEMENT OSTEOCHONDRAL DEFECT, partial synovectomy;  Surgeon: Eldred Manges, MD;  Location: [facility] OR;  Service: Orthopedics;  Laterality: Left;   APPENDECTOMY     BACK SURGERY  2016   Laminectomy   CARPAL TUNNEL RELEASE Left  02-14-14   per Dr. Richardson Landry    COLONOSCOPY  2008   clear, repeat in 10 yrs    COLONOSCOPY WITH PROPOFOL N/A 11/06/2016   Procedure: COLONOSCOPY WITH PROPOFOL;  Surgeon: Rachael Fee, MD;  Location: [facility];  Service: Endoscopy;  Laterality: N/A;   JOINT REPLACEMENT  20060,2007   bilateral knees, Dr. Eulah Pont    RADIOFREQUENCY ABLATION  02/2011   x 2   Scrotal Abcess  I/D - healed   TONSILLECTOMY     TOTAL KNEE REVISION  10/01/2011   Procedure: TOTAL KNEE REVISION;  Surgeon: Loreta Ave, MD;  Location: [facility] OR;  Service: Orthopedics;  Laterality: Left;  left total knee revision, tibial and patellar components   Social History   Occupational History   Not on file  Tobacco Use   Smoking status: Never   Smokeless tobacco: Never  Vaping Use   Vaping Use: Never used  Substance and Sexual Activity   Alcohol use: Yes    Alcohol/week: 6.0 standard drinks of alcohol    Types: 6 Cans of beer per week   Drug use: No   Sexual activity: Not on file

## 2022-11-25 DIAGNOSIS — M7062 Trochanteric bursitis, left hip: Secondary | ICD-10-CM

## 2022-11-25 MED ORDER — METHYLPREDNISOLONE ACETATE 40 MG/ML IJ SUSP
40.0000 mg | INTRAMUSCULAR | Status: AC | PRN
  Administered 2022-11-25: 40 mg via INTRA_ARTICULAR

## 2022-11-25 MED ORDER — LIDOCAINE HCL 1 % IJ SOLN
1.0000 mL | INTRAMUSCULAR | Status: AC | PRN
  Administered 2022-11-25: 1 mL

## 2022-11-25 MED ORDER — BUPIVACAINE HCL 0.25 % IJ SOLN
2.0000 mL | INTRAMUSCULAR | Status: AC | PRN
  Administered 2022-11-25: 2 mL via INTRA_ARTICULAR

## 2022-12-01 ENCOUNTER — Encounter: Payer: Self-pay | Admitting: Pulmonary Disease

## 2022-12-01 ENCOUNTER — Ambulatory Visit (INDEPENDENT_AMBULATORY_CARE_PROVIDER_SITE_OTHER): Payer: PPO | Admitting: Pulmonary Disease

## 2022-12-01 VITALS — BP 122/62 | HR 76 | Ht 75.0 in | Wt 383.0 lb

## 2022-12-01 DIAGNOSIS — G4733 Obstructive sleep apnea (adult) (pediatric): Secondary | ICD-10-CM | POA: Diagnosis not present

## 2022-12-01 NOTE — Patient Instructions (Signed)
DME referral -Decrease pressure from 15 down to 13  -Trial with a new mask, fullface mask  Will keep your appointment the same at a year  You are welcome to call us or reach out to [place] if you are still having any significant problems

## 2022-12-01 NOTE — Progress Notes (Signed)
Raymond Herrera    [id number]    21-Jul-1955  Primary Care Physician:Corrington, Meredith Mody, MD  Referring Physician: Vivien Presto, MD [phone] [address] [place],  [place] [address]  Chief complaint:   Patient with a history of obstructive sleep apnea  Compliant with CPAP Did receive a new machine On a CPAP of 15  HPI: Having some mask issues feeling bloated Will wake up sometimes need to belch before he is able to go back to bed  Has been having some chronic back pain  Has no other significant difficulty with his CPAP at present  No significant mask leak  Sleeping well Good energy levels No significant change in his health since the last visit  Last sleep study was in 2011 In 2012 was started on an auto titrating setting, use the machine for about 4 to 6 weeks to try and determine what pressure is required, machine is currently set on a fixed pressure of 15  Usually goes to bed about 10 PM, about 1 awakening, final wake up time 8 AM  Experiences dryness No headache  Weight has remained stable  Outpatient Encounter Medications as of 12/01/2022  Medication Sig   aspirin 325 MG tablet Take 650 mg by mouth every 6 (six) hours as needed for moderate pain.   atenolol (TENORMIN) 25 MG tablet Take by mouth.   BD DISP NEEDLES 18G X 1-1/2" MISC    celecoxib (CELEBREX) 200 MG capsule Take 1 capsule (200 mg total) by mouth 2 (two) times daily. (Patient taking differently: Take 200 mg by mouth daily as needed for moderate pain.)   diclofenac Sodium (VOLTAREN) 1 % GEL Apply 1 application topically 4 (four) times daily as needed (pain).   EPINEPHrine (EPIPEN 2-PAK) 0.3 mg/0.3 mL SOAJ Inject 0.3 mLs (0.3 mg total) into the muscle once.   esomeprazole (NEXIUM) 40 MG capsule take twice a day (Patient taking differently: Take 40 mg by mouth See admin instructions. Take 40 mg daily, may take a second 40 mg dose as needed for acid reflux)   fluticasone (FLONASE) 50 MCG/ACT  nasal spray USE 2 SPRAYS IN EACH NOSTRIL ONCE DAILY (Patient taking differently: Place 2 sprays into both nostrils daily as needed for allergies.)   gentamicin cream (GARAMYCIN) 0.1 % Apply 1 application. topically 2 (two) times daily.   hydrochlorothiazide (HYDRODIURIL) 25 MG tablet Take 1 tablet by mouth every morning.   lisinopril (ZESTRIL) 20 MG tablet Take 20 mg by mouth 2 (two) times daily.   LORazepam (ATIVAN) 2 MG tablet Take 2 mg by mouth in the morning and at bedtime.   methocarbamol (ROBAXIN) 500 MG tablet Take 1 tablet (500 mg total) by mouth 4 (four) times daily. (Patient taking differently: Take 500 mg by mouth 3 (three) times daily as needed for muscle spasms.)   naloxone (NARCAN) nasal spray 4 mg/0.1 mL Place 1 spray into the nose as needed (opioid overdose).   Naphazoline HCl (CLEAR EYES OP) Place 1 drop into both eyes daily as needed (redness).   NEEDLE, DISP, 18 G (BD DISP NEEDLES) 18G X 1-1/2" MISC    Nutritional Supplements (JUICE PLUS FIBRE PO) Take 6 capsules by mouth daily. Take 2 Fruits + 2 Vegetables + 2 Berry   Oxycodone HCl 10 MG TABS Take 1 tablet (10 mg total) by mouth every 4 (four) hours as needed (pain).   tamsulosin (FLOMAX) 0.4 MG CAPS capsule Take 0.4 mg by mouth  daily.   testosterone cypionate (DEPOTESTOSTERONE CYPIONATE) 200 MG/ML injection Inject 100 mg into the muscle every Friday.   No facility-administered encounter medications on file as of 12/01/2022.    Allergies as of 12/01/2022 - Review Complete 12/01/2022  Allergen Reaction Noted   Bee venom Anaphylaxis and Swelling 10/01/2011   Pregabalin Swelling 04/24/2015   Tramadol Itching 07/20/2015    Past Medical History:  Diagnosis Date   Allergy    Anxiety    Arthritis    Both Knees   Chronic low back pain    had seen Dr. Sharolyn Douglas, the Workers Comp case has been settled   Depression    Family history of adverse reaction to anesthesia    sister ponv   GERD (gastroesophageal reflux disease)     Hyperlipidemia    Hypogonadism male    sees Dr. Isabel Caprice    Plantar fasciitis    hx of   Pre-diabetes    Scrotal abscess 06/06/2010   MRSA, lanced by Dr. Isabel Caprice    Sleep apnea, obstructive    sees Dr. Shelle Iron, uses CPAP  setting of 12    Past Surgical History:  Procedure Laterality Date   ANKLE ARTHROSCOPY Left 07/05/2021   Procedure: LEFT ANKLE ARTHROSCOPY, DEBRIDEMENT OSTEOCHONDRAL DEFECT, partial synovectomy;  Surgeon: Eldred Manges, MD;  Location: [facility] OR;  Service: Orthopedics;  Laterality: Left;   APPENDECTOMY     BACK SURGERY  2016   Laminectomy   CARPAL TUNNEL RELEASE Left 02-14-14   per Dr. Richardson Landry    COLONOSCOPY  2008   clear, repeat in 10 yrs    COLONOSCOPY WITH PROPOFOL N/A 11/06/2016   Procedure: COLONOSCOPY WITH PROPOFOL;  Surgeon: Rachael Fee, MD;  Location: [facility];  Service: Endoscopy;  Laterality: N/A;   JOINT REPLACEMENT  20060,2007   bilateral knees, Dr. Eulah Pont    RADIOFREQUENCY ABLATION  02/2011   x 2   Scrotal Abcess     I/D - healed   TONSILLECTOMY     TOTAL KNEE REVISION  10/01/2011   Procedure: TOTAL KNEE REVISION;  Surgeon: Loreta Ave, MD;  Location: [facility] OR;  Service: Orthopedics;  Laterality: Left;  left total knee revision, tibial and patellar components    Family History  Problem Relation Age of Onset   Stroke Father    Anesthesia problems Neg Hx     Social History   Socioeconomic History   Marital status: Married    Spouse name: Not on file   Number of children: Not on file   Years of education: Not on file   Highest education level: Not on file  Occupational History   Not on file  Tobacco Use   Smoking status: Never   Smokeless tobacco: Never  Vaping Use   Vaping Use: Never used  Substance and Sexual Activity   Alcohol use: Yes    Alcohol/week: 6.0 standard drinks of alcohol    Types: 6 Cans of beer per week   Drug use: No   Sexual activity: Not on file  Other Topics Concern   Not on file  Social History Narrative    Not on file   Social Determinants of Health   Financial Resource Strain: Not on file  Food Insecurity: Not on file  Transportation Needs: Not on file  Physical Activity: Not on file  Stress: Not on file  Social Connections: Not on file  Intimate Partner Violence: Not on file    Review of Systems  Constitutional:  Positive for activity change.  Respiratory:  Positive for apnea.   Psychiatric/Behavioral:  Positive for sleep disturbance.     Vitals:   12/01/22 1505  BP: 122/62  Pulse: 76  SpO2: 94%     Physical Exam Constitutional:      Appearance: He is obese.  HENT:     Head: Normocephalic.     Mouth/Throat:     Mouth: Mucous membranes are moist.     Comments: Mallampati 4, crowded oropharynx Eyes:     General: No scleral icterus.    Pupils: Pupils are equal, round, and reactive to light.  Neck:     Comments: Neck is thick Cardiovascular:     Rate and Rhythm: Normal rate.     Pulses: Normal pulses.     Heart sounds: Normal heart sounds. No murmur heard.    No friction rub.  Pulmonary:     Effort: Pulmonary effort is normal. No respiratory distress.     Breath sounds: No stridor. No wheezing or rhonchi.  Musculoskeletal:     Cervical back: No rigidity or tenderness.  Skin:    General: Skin is warm.  Neurological:     General: No focal deficit present.     Mental Status: He is alert.  Psychiatric:        Mood and Affect: Mood normal.    Data Reviewed: Patient remains on CPAP of 15  100% compliance with CPAP Having some issues with mask leaks Residual AHI of 0. 2  Assessment:   Severe obstructive sleep apnea Tolerating CPAP of 15 -Having issues with the mask -Some issues with bloating   We did discuss reducing pressures  Trial with fullface mask instead of the nose mask  Obesity  Plan/Recommendations:  Contact DME for new mask  Reduce pressure from 15-13  Encouraged to call with concerns  Importance of continuing with CPAP on a  nightly basis discussed  Follow-up in a year  Virl Diamond MD [facility] 12/01/2022, 3:45 PM  CC: Corrington, Kip A, MD

## 2023-01-01 ENCOUNTER — Ambulatory Visit (HOSPITAL_BASED_OUTPATIENT_CLINIC_OR_DEPARTMENT_OTHER): Payer: PPO | Attending: Orthopedic Surgery | Admitting: Physical Therapy

## 2023-01-01 ENCOUNTER — Encounter (HOSPITAL_BASED_OUTPATIENT_CLINIC_OR_DEPARTMENT_OTHER): Payer: Self-pay | Admitting: Physical Therapy

## 2023-01-01 ENCOUNTER — Other Ambulatory Visit: Payer: Self-pay

## 2023-01-01 DIAGNOSIS — M546 Pain in thoracic spine: Secondary | ICD-10-CM | POA: Insufficient documentation

## 2023-01-01 DIAGNOSIS — M542 Cervicalgia: Secondary | ICD-10-CM | POA: Diagnosis not present

## 2023-01-01 DIAGNOSIS — M6281 Muscle weakness (generalized): Secondary | ICD-10-CM | POA: Insufficient documentation
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
## 2023-01-17 ENCOUNTER — Encounter (HOSPITAL_BASED_OUTPATIENT_CLINIC_OR_DEPARTMENT_OTHER): Payer: Self-pay | Admitting: Physical Therapy

## 2023-01-17 ENCOUNTER — Ambulatory Visit (HOSPITAL_BASED_OUTPATIENT_CLINIC_OR_DEPARTMENT_OTHER): Payer: PPO | Admitting: Physical Therapy

## 2023-01-17 DIAGNOSIS — M546 Pain in thoracic spine: Secondary | ICD-10-CM | POA: Diagnosis not present

## 2023-01-17 DIAGNOSIS — M542 Cervicalgia: Secondary | ICD-10-CM

## 2023-01-17 DIAGNOSIS — M6281 Muscle weakness (generalized): Secondary | ICD-10-CM
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
## 2023-01-20 ENCOUNTER — Encounter (HOSPITAL_BASED_OUTPATIENT_CLINIC_OR_DEPARTMENT_OTHER): Payer: Self-pay | Admitting: Physical Therapy

## 2023-01-20 ENCOUNTER — Ambulatory Visit (HOSPITAL_BASED_OUTPATIENT_CLINIC_OR_DEPARTMENT_OTHER): Payer: PPO | Admitting: Physical Therapy

## 2023-01-20 DIAGNOSIS — M6281 Muscle weakness (generalized): Secondary | ICD-10-CM

## 2023-01-20 DIAGNOSIS — M546 Pain in thoracic spine: Secondary | ICD-10-CM | POA: Diagnosis not present

## 2023-01-20 DIAGNOSIS — M542 Cervicalgia: Secondary | ICD-10-CM
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
## 2023-01-22 ENCOUNTER — Ambulatory Visit (HOSPITAL_BASED_OUTPATIENT_CLINIC_OR_DEPARTMENT_OTHER): Payer: PPO | Admitting: Physical Therapy

## 2023-01-22 ENCOUNTER — Other Ambulatory Visit: Payer: Self-pay | Admitting: Urology

## 2023-01-27 ENCOUNTER — Ambulatory Visit (HOSPITAL_BASED_OUTPATIENT_CLINIC_OR_DEPARTMENT_OTHER): Payer: PPO | Attending: Orthopedic Surgery | Admitting: Physical Therapy

## 2023-01-27 ENCOUNTER — Encounter (HOSPITAL_BASED_OUTPATIENT_CLINIC_OR_DEPARTMENT_OTHER): Payer: Self-pay | Admitting: Physical Therapy

## 2023-01-27 DIAGNOSIS — M546 Pain in thoracic spine: Secondary | ICD-10-CM | POA: Insufficient documentation

## 2023-01-27 DIAGNOSIS — M6281 Muscle weakness (generalized): Secondary | ICD-10-CM | POA: Diagnosis present

## 2023-01-27 DIAGNOSIS — M542 Cervicalgia: Secondary | ICD-10-CM | POA: Insufficient documentation
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
## 2023-01-29 ENCOUNTER — Ambulatory Visit (HOSPITAL_BASED_OUTPATIENT_CLINIC_OR_DEPARTMENT_OTHER): Payer: PPO | Admitting: Physical Therapy

## 2023-01-29 ENCOUNTER — Encounter (HOSPITAL_BASED_OUTPATIENT_CLINIC_OR_DEPARTMENT_OTHER): Payer: Self-pay | Admitting: Physical Therapy

## 2023-01-29 DIAGNOSIS — M6281 Muscle weakness (generalized): Secondary | ICD-10-CM

## 2023-01-29 DIAGNOSIS — M542 Cervicalgia: Secondary | ICD-10-CM

## 2023-01-29 DIAGNOSIS — M546 Pain in thoracic spine: Secondary | ICD-10-CM
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
## 2023-02-03 ENCOUNTER — Encounter (HOSPITAL_BASED_OUTPATIENT_CLINIC_OR_DEPARTMENT_OTHER): Payer: PPO | Admitting: Physical Therapy

## 2023-02-05 ENCOUNTER — Ambulatory Visit (HOSPITAL_BASED_OUTPATIENT_CLINIC_OR_DEPARTMENT_OTHER): Payer: PPO | Admitting: Physical Therapy

## 2023-02-10 ENCOUNTER — Other Ambulatory Visit: Payer: Self-pay

## 2023-02-10 ENCOUNTER — Ambulatory Visit: Payer: PPO | Admitting: Orthopaedic Surgery

## 2023-02-10 VITALS — BP 134/79 | HR 84

## 2023-02-10 DIAGNOSIS — G8929 Other chronic pain: Secondary | ICD-10-CM | POA: Diagnosis not present

## 2023-02-10 DIAGNOSIS — M25511 Pain in right shoulder: Secondary | ICD-10-CM | POA: Diagnosis not present

## 2023-02-10 NOTE — Progress Notes (Signed)
Office Visit Note   Patient: Raymond Herrera           Date of Birth: 09-08-1955           MRN: [id number] Visit Date: 02/10/2023              Requested by: Vivien Presto, MD [phone] [address] [place],  [place] [address] PCP: Vivien Presto, MD   Assessment & Plan: Visit Diagnoses:  1. Chronic right shoulder pain     Plan: Right subacromial injection performed.  He tolerated the injection well.  If he has persistent problems since he has been through therapy medications and on chronic pain management neck step would be an MRI scan if he has increasing symptoms.  Follow-Up Instructions: No follow-ups on file.   Orders:  Orders Placed This Encounter  Procedures   XR Shoulder Right   No orders of the defined types were placed in this encounter.     Procedures: Large Joint Inj: R subacromial bursa on 02/13/2023 10:32 AM Indications: pain Details: 22 G 1.5 in needle, lateral approach  Arthrogram: No  Medications: 4 mL bupivacaine 0.25 %; 40 mg methylPREDNISolone acetate 40 MG/ML; 0.5 mL lidocaine 1 % Outcome: tolerated well, no immediate complications Procedure, treatment alternatives, risks and benefits explained, specific risks discussed. Consent was given by the patient. Immediately prior to procedure a time out was called to verify the correct patient, procedure, equipment, support staff and site/side marked as required. Patient was prepped and draped in the usual sterile fashion.       Clinical Data: No additional findings.   Subjective: Chief Complaint  Patient presents with   Right Shoulder - Pain    HPI 67 year old male with chronic right shoulder pain worse x 2 months pain with outstretched reaching.  Has been to therapy, pool exercises, already on oxycodone 10 mg 180 tablets monthly.  He also uses Voltaren gel.  Review of Systems all systems noncontributory to HPI.   Objective: Vital Signs: BP 134/79   Pulse 84   Physical  Exam Constitutional:      Appearance: He is well-developed.  HENT:     Head: Normocephalic and atraumatic.     Right Ear: External ear normal.     Left Ear: External ear normal.  Eyes:     Pupils: Pupils are equal, round, and reactive to light.  Neck:     Thyroid: No thyromegaly.     Trachea: No tracheal deviation.  Cardiovascular:     Rate and Rhythm: Normal rate.  Pulmonary:     Effort: Pulmonary effort is normal.     Breath sounds: No wheezing.  Abdominal:     General: Bowel sounds are normal.     Palpations: Abdomen is soft.  Musculoskeletal:     Cervical back: Neck supple.  Skin:    General: Skin is warm and dry.     Capillary Refill: Capillary refill takes less than 2 seconds.  Neurological:     Mental Status: He is alert and oriented to person, place, and time.  Psychiatric:        Behavior: Behavior normal.        Thought Content: Thought content normal.        Judgment: Judgment normal.     Ortho Exam pain with reaching behind posterior axillary line with his hand he can get his arm up over his head easier in flexion then with abduction.  Crepitus with range of motion  AC joint is nontender.  Long head of the biceps is tender pain with resisted supraspinatus testing positive impingement negative drop arm test.  Specialty Comments:  No specialty comments available.  Imaging: No results found.   PMFS History: Patient Active Problem List   Diagnosis Date Noted   Trochanteric bursitis, left hip 11/25/2022   Impingement syndrome of left shoulder 08/20/2021   Essential hypertension 04/03/2021   Osteochondral defect of ankle 04/01/2021   Transient synovitis of left elbow 12/21/2019   Posterior tibial tendonitis, left 11/14/2019   Morbid obesity (HCC) 02/22/2019   Driving safety issue 78/46/9629   Colon cancer screening 02/15/2018   Benign neoplasm of transverse colon 12/09/2017   GAD (generalized anxiety disorder) 12/09/2017   HYPOGONADISM 06/12/2010    Obstructive sleep apnea 05/07/2010   Epigastric pain 09/17/2009   Low back pain 12/05/2008   Gastro-esophageal reflux disease without esophagitis 11/15/2007   Palpitations 11/15/2007   Osteoarthritis 07/09/2007   Lumbar spondylosis with myelopathy 07/09/2007   Anxiety 02/01/2007   FASCIITIS, PLANTAR 02/01/2007   Personal history presenting hazards to health 02/01/2007   Past Medical History:  Diagnosis Date   Allergy    Anxiety    Arthritis    Both Knees   Chronic low back pain    had seen Dr. Sharolyn Douglas, the Workers Comp case has been settled   Depression    Family history of adverse reaction to anesthesia    sister ponv   GERD (gastroesophageal reflux disease)    Hyperlipidemia    Hypogonadism male    sees Dr. Isabel Caprice    Plantar fasciitis    hx of   Pre-diabetes    Scrotal abscess 06/06/2010   MRSA, lanced by Dr. Isabel Caprice    Sleep apnea, obstructive    sees Dr. Shelle Iron, uses CPAP  setting of 12    Family History  Problem Relation Age of Onset   Stroke Father    Anesthesia problems Neg Hx     Past Surgical History:  Procedure Laterality Date   ANKLE ARTHROSCOPY Left 07/05/2021   Procedure: LEFT ANKLE ARTHROSCOPY, DEBRIDEMENT OSTEOCHONDRAL DEFECT, partial synovectomy;  Surgeon: Eldred Manges, MD;  Location: [facility] OR;  Service: Orthopedics;  Laterality: Left;   APPENDECTOMY     BACK SURGERY  2016   Laminectomy   CARPAL TUNNEL RELEASE Left 02-14-14   per Dr. Richardson Landry    COLONOSCOPY  2008   clear, repeat in 10 yrs    COLONOSCOPY WITH PROPOFOL N/A 11/06/2016   Procedure: COLONOSCOPY WITH PROPOFOL;  Surgeon: Rachael Fee, MD;  Location: [facility];  Service: Endoscopy;  Laterality: N/A;   JOINT REPLACEMENT  20060,2007   bilateral knees, Dr. Eulah Pont    RADIOFREQUENCY ABLATION  02/2011   x 2   Scrotal Abcess     I/D - healed   TONSILLECTOMY     TOTAL KNEE REVISION  10/01/2011   Procedure: TOTAL KNEE REVISION;  Surgeon: Loreta Ave, MD;  Location: [facility] OR;  Service:  Orthopedics;  Laterality: Left;  left total knee revision, tibial and patellar components   Social History   Occupational History   Not on file  Tobacco Use   Smoking status: Never   Smokeless tobacco: Never  Vaping Use   Vaping status: Never Used  Substance and Sexual Activity   Alcohol use: Yes    Alcohol/week: 6.0 standard drinks of alcohol    Types: 6 Cans of beer per week   Drug use: No  Sexual activity: Not on file

## 2023-02-12 ENCOUNTER — Ambulatory Visit (HOSPITAL_BASED_OUTPATIENT_CLINIC_OR_DEPARTMENT_OTHER): Payer: PPO | Admitting: Physical Therapy

## 2023-02-13 DIAGNOSIS — M25511 Pain in right shoulder: Secondary | ICD-10-CM

## 2023-02-13 DIAGNOSIS — G8929 Other chronic pain: Secondary | ICD-10-CM

## 2023-02-13 MED ORDER — LIDOCAINE HCL 1 % IJ SOLN
0.5000 mL | INTRAMUSCULAR | Status: AC | PRN
  Administered 2023-02-13: .5 mL

## 2023-02-13 MED ORDER — METHYLPREDNISOLONE ACETATE 40 MG/ML IJ SUSP
40.0000 mg | INTRAMUSCULAR | Status: AC | PRN
  Administered 2023-02-13: 40 mg via INTRA_ARTICULAR

## 2023-02-13 MED ORDER — BUPIVACAINE HCL 0.25 % IJ SOLN
4.0000 mL | INTRAMUSCULAR | Status: AC | PRN
  Administered 2023-02-13: 4 mL via INTRA_ARTICULAR

## 2023-02-24 NOTE — Progress Notes (Signed)
COVID Vaccine Completed: yes  Date of COVID positive in last 90 days:  PCP - Delano Metz, MD Cardiologist -   Chest x-ray -  EKG -  Stress Test - 05/08/15 Epic ECHO -  Cardiac Cath -  Pacemaker/ICD device last checked: Spinal Cord Stimulator:  Bowel Prep -   Sleep Study -  CPAP -   Fasting Blood Sugar - preDM Checks Blood Sugar _____ times a day  Last dose of GLP1 agonist-  N/A GLP1 instructions:  N/A   Last dose of SGLT-2 inhibitors-  N/A SGLT-2 instructions: N/A   Blood Thinner Instructions:  Time Aspirin Instructions: ASA 325 Last Dose:  Activity level:  Can go up a flight of stairs and perform activities of daily living without stopping and without symptoms of chest pain or shortness of breath.  Able to exercise without symptoms  Unable to go up a flight of stairs without symptoms of     Anesthesia review:   Patient denies shortness of breath, fever, cough and chest pain at PAT appointment  Patient verbalized understanding of instructions that were given to them at the PAT appointment. Patient was also instructed that they will need to review over the PAT instructions again at home before surgery.

## 2023-02-24 NOTE — Patient Instructions (Signed)
SURGICAL WAITING ROOM VISITATION  Patients having surgery or a procedure may have no more than 2 support people in the waiting area - these visitors may rotate.    Children under the age of 75 must have an adult with them who is not the patient.  Due to an increase in RSV and influenza rates and associated hospitalizations, children ages 7 and under may not visit patients in [facility] hospitals.  If the patient needs to stay at the hospital during part of their recovery, the visitor guidelines for inpatient rooms apply. Pre-op nurse will coordinate an appropriate time for 1 support person to accompany patient in pre-op.  This support person may not rotate.    Please refer to the [facility] website for the visitor guidelines for Inpatients (after your surgery is over and you are in a regular room).    Your procedure is scheduled on: 03/06/23   Report to [facility] Main Entrance    Report to admitting at 5:15 AM   Call this number if you have problems the morning of surgery [phone]   Do not eat food or drink liquids:After Midnight.          If you have questions, please contact your surgeon's office.   FOLLOW BOWEL PREP AND ANY ADDITIONAL PRE OP INSTRUCTIONS YOU RECEIVED FROM YOUR SURGEON'S OFFICE!!!     Oral Hygiene is also important to reduce your risk of infection.                                    Remember - BRUSH YOUR TEETH THE MORNING OF SURGERY WITH YOUR REGULAR TOOTHPASTE  DENTURES WILL BE REMOVED PRIOR TO SURGERY PLEASE DO NOT APPLY "Poly grip" OR ADHESIVES!!!   Stop all vitamins and herbal supplements 7 days before surgery.   Take these medicines the morning of surgery with A SIP OF WATER: Atenolol, Nexium, Flonase, Lorazepam, Oxycodone, Tamsulosin   Bring CPAP mask and tubing day of surgery.                              You may not have any metal on your body including jewelry, and body piercing             Do not wear lotions, powders, cologne,  or deodorant              Men may shave face and neck.   Do not bring valuables to the hospital. [facility] IS NOT             RESPONSIBLE   FOR VALUABLES.   Contacts, glasses, dentures or bridgework may not be worn into surgery.   Bring small overnight bag day of surgery.   DO NOT BRING YOUR HOME MEDICATIONS TO THE HOSPITAL. PHARMACY WILL DISPENSE MEDICATIONS LISTED ON YOUR MEDICATION LIST TO YOU DURING YOUR ADMISSION IN THE HOSPITAL!              Please read over the following fact sheets you were given: IF YOU HAVE QUESTIONS ABOUT YOUR PRE-OP INSTRUCTIONS PLEASE CALL [phone] Contras    If you received a COVID test during your pre-op visit  it is requested that you wear a mask when out in public, stay away from anyone that may not be feeling well and notify your surgeon if you develop symptoms. If you test positive for  Covid or have been in contact with anyone that has tested positive in the last 10 days please notify you surgeon.    [facility] - Preparing for Surgery Before surgery, you can play an important role.  Because skin is not sterile, your skin needs to be as free of germs as possible.  You can reduce the number of germs on your skin by washing with CHG (chlorahexidine gluconate) soap before surgery.  CHG is an antiseptic cleaner which kills germs and bonds with the skin to continue killing germs even after washing. Please DO NOT use if you have an allergy to CHG or antibacterial soaps.  If your skin becomes reddened/irritated stop using the CHG and inform your nurse when you arrive at Short Stay. Do not shave (including legs and underarms) for at least 48 hours prior to the first CHG shower.  You may shave your face/neck.  Please follow these instructions carefully:  1.  Shower with CHG Soap the night before surgery and the  morning of surgery.  2.  If you choose to wash your hair, wash your hair first as usual with your normal  shampoo.  3.  After you shampoo, rinse  your hair and body thoroughly to remove the shampoo.                             4.  Use CHG as you would any other liquid soap.  You can apply chg directly to the skin and wash.  Gently with a scrungie or clean washcloth.  5.  Apply the CHG Soap to your body ONLY FROM THE NECK DOWN.   Do   not use on face/ open                           Wound or open sores. Avoid contact with eyes, ears mouth and   genitals (private parts).                       Wash face,  Genitals (private parts) with your normal soap.             6.  Wash thoroughly, paying special attention to the area where your    surgery  will be performed.  7.  Thoroughly rinse your body with warm water from the neck down.  8.  DO NOT shower/wash with your normal soap after using and rinsing off the CHG Soap.                9.  Pat yourself dry with a clean towel.            10.  Wear clean pajamas.            11.  Place clean sheets on your bed the night of your first shower and do not  sleep with pets. Day of Surgery : Do not apply any lotions/deodorants the morning of surgery.  Please wear clean clothes to the hospital/surgery center.  FAILURE TO FOLLOW THESE INSTRUCTIONS MAY RESULT IN THE CANCELLATION OF YOUR SURGERY  PATIENT SIGNATURE_________________________________  NURSE SIGNATURE__________________________________  ________________________________________________________________________

## 2023-02-25 ENCOUNTER — Other Ambulatory Visit: Payer: Self-pay

## 2023-02-25 ENCOUNTER — Encounter (HOSPITAL_COMMUNITY): Payer: Self-pay

## 2023-02-25 ENCOUNTER — Encounter (HOSPITAL_COMMUNITY)
Admission: RE | Admit: 2023-02-25 | Discharge: 2023-02-25 | Disposition: A | Discharge status: Home / Self Care | Payer: PPO | Source: Ambulatory Visit | Attending: Urology | Admitting: Urology

## 2023-02-25 VITALS — BP 143/93 | HR 68 | Temp 98.6°F | Resp 14 | Ht 75.0 in | Wt 365.0 lb

## 2023-02-25 DIAGNOSIS — I10 Essential (primary) hypertension: Secondary | ICD-10-CM | POA: Diagnosis not present

## 2023-02-25 DIAGNOSIS — Z01818 Encounter for other preprocedural examination: Secondary | ICD-10-CM | POA: Insufficient documentation

## 2023-02-25 LAB — CBC
HCT: 46.9 % (ref 39.0–52.0)
Hemoglobin: 16.4 g/dL (ref 13.0–17.0)
MCH: 31 pg (ref 26.0–34.0)
MCHC: 35 g/dL (ref 30.0–36.0)
MCV: 88.7 fL (ref 80.0–100.0)
Platelets: 180 10*3/uL (ref 150–400)
RBC: 5.29 MIL/uL (ref 4.22–5.81)
RDW: 12.9 % (ref 11.5–15.5)
WBC: 8.3 10*3/uL (ref 4.0–10.5)
nRBC: 0 % (ref 0.0–0.2)

## 2023-02-25 LAB — BASIC METABOLIC PANEL
Anion gap: 7 (ref 5–15)
BUN: 12 mg/dL (ref 8–23)
CO2: 25 mmol/L (ref 22–32)
Calcium: 8.5 mg/dL — ABNORMAL LOW (ref 8.9–10.3)
Chloride: 95 mmol/L — ABNORMAL LOW (ref 98–111)
Creatinine, Ser: 0.76 mg/dL (ref 0.61–1.24)
GFR, Estimated: >60 mL/min (ref >60)
Glucose, Bld: 110 mg/dL — ABNORMAL HIGH (ref 70–99)
Potassium: 3.7 mmol/L (ref 3.5–5.1)
Sodium: 127 mmol/L — ABNORMAL LOW (ref 135–145)

## 2023-02-25 LAB — SURGICAL PCR SCREEN
MRSA, PCR: NEGATIVE
Staphylococcus aureus: NEGATIVE

## 2023-02-25 NOTE — Progress Notes (Signed)
Bari bed requested

## 2023-02-25 NOTE — Progress Notes (Signed)
Na+ 127, results routed to Dr. Mena Goes

## 2023-02-26 ENCOUNTER — Ambulatory Visit (HOSPITAL_COMMUNITY): Payer: PPO | Admitting: Physician Assistant

## 2023-03-04 NOTE — Anesthesia Preprocedure Evaluation (Addendum)
Anesthesia Evaluation  Patient identified by MRN, date of birth, ID band Patient awake    Reviewed: Allergy & Precautions, H&P , NPO status , Patient's Chart, lab work & pertinent test results  Airway Mallampati: II  TM Distance: >3 FB Neck ROM: Full    Dental no notable dental hx.    Pulmonary sleep apnea    Pulmonary exam normal breath sounds clear to auscultation       Cardiovascular hypertension, Normal cardiovascular exam Rhythm:Regular Rate:Normal     Neuro/Psych  PSYCHIATRIC DISORDERS Anxiety Depression    negative neurological ROS     GI/Hepatic Neg liver ROS,GERD  ,,  Endo/Other  negative endocrine ROS    Renal/GU negative Renal ROS  negative genitourinary   Musculoskeletal  (+) Arthritis ,    Abdominal   Peds negative pediatric ROS (+)  Hematology negative hematology ROS (+)   Anesthesia Other Findings   Reproductive/Obstetrics negative OB ROS                              Anesthesia Physical Anesthesia Plan  ASA: 3  Anesthesia Plan: General   Post-op Pain Management:    Induction: Intravenous  PONV Risk Score and Plan: Ondansetron and Dexamethasone  Airway Management Planned: LMA  Additional Equipment:   Intra-op Plan:   Post-operative Plan: Extubation in OR  Informed Consent: I have reviewed the patients History and Physical, chart, labs and discussed the procedure including the risks, benefits and alternatives for the proposed anesthesia with the patient or authorized representative who has indicated his/her understanding and acceptance.     Dental advisory given  Plan Discussed with: CRNA  Anesthesia Plan Comments: (See PAT note 02/25/23, Shanda Bumps Ward, PA-C, recheck sodium DOS  67 y.o. never smoker with h/o sleep apnea, HTN, chronic pain, BPH scheduled for above procedure 110/03/2023 with Dr. Jerilee Field. )        Anesthesia Quick  Evaluation

## 2023-03-04 NOTE — Progress Notes (Signed)
Anesthesia Chart Review   Case: [id number] Date/Time: 03/06/23 0715   Procedure: TRANSURETHRAL WATERJET ABLATION OF PROSTATE - 90 MINS FOR CASE   Anesthesia type: General   Pre-op diagnosis: BENIGN PROSTATE HYPERPLASIA   Location: [facility] PROC[facility]   Surgeons: Jerilee Field, MD       DISCUSSION:67 y.o. never smoker with h/o sleep apnea, HTN, chronic pain, BPH scheduled for above procedure 110/03/2023 with Dr. Jerilee Field.   Pt with multiple lumbar surgeries.   Low risk stres test 05/08/2015.   Last seen by PCP 01/13/2023, stable at this visit.   Sodium 127 at PAT visit. Forwarded to PCP and surgeon's office. Evaluate DOS.   VS: BP (!) 143/93   Pulse 68   Temp 37 C (Oral)   Resp 14   Ht 6\' 3"  (1.905 m)   Wt (!) 165.6 kg   SpO2 96%   BMI 45.62 kg/m   PROVIDERS: Corrington, Kip A, MD is PCP    LABS:  recheck sodium DOS (all labs ordered are listed, but only abnormal results are displayed)  Labs Reviewed  BASIC METABOLIC PANEL - Abnormal; Notable for the following components:      Result Value   Sodium 127 (*)    Chloride 95 (*)    Glucose, Bld 110 (*)    Calcium 8.5 (*)    All other components within normal limits  SURGICAL PCR SCREEN  CBC     IMAGES:   EKG:   CV:  Past Medical History:  Diagnosis Date   Allergy    Anxiety    Arthritis    Both Knees   Chronic low back pain    had seen Dr. Sharolyn Douglas, the Workers Comp case has been settled   Depression    Family history of adverse reaction to anesthesia    sister ponv   GERD (gastroesophageal reflux disease)    Hyperlipidemia    Hypogonadism male    sees Dr. Isabel Caprice    Plantar fasciitis    hx of   Pre-diabetes    Scrotal abscess 06/06/2010   MRSA, lanced by Dr. Isabel Caprice    Sleep apnea, obstructive    sees Dr. Shelle Iron, uses CPAP  setting of 12    Past Surgical History:  Procedure Laterality Date   ANKLE ARTHROSCOPY Left 07/05/2021   Procedure: LEFT ANKLE ARTHROSCOPY,  DEBRIDEMENT OSTEOCHONDRAL DEFECT, partial synovectomy;  Surgeon: Eldred Manges, MD;  Location: [facility] OR;  Service: Orthopedics;  Laterality: Left;   APPENDECTOMY     BACK SURGERY  2016   Laminectomy   CARPAL TUNNEL RELEASE Left 02/14/2014   per Dr. Richardson Landry    COLONOSCOPY  2008   clear, repeat in 10 yrs    COLONOSCOPY WITH PROPOFOL N/A 11/06/2016   Procedure: COLONOSCOPY WITH PROPOFOL;  Surgeon: Rachael Fee, MD;  Location: [facility];  Service: Endoscopy;  Laterality: N/A;   JOINT REPLACEMENT  20060,2007   bilateral knees, Dr. Eulah Pont    KNEE ARTHROSCOPY Bilateral    RADIOFREQUENCY ABLATION  02/2011   x 2   Scrotal Abcess     I/D - healed   TONSILLECTOMY     TOTAL KNEE REVISION  10/01/2011   Procedure: TOTAL KNEE REVISION;  Surgeon: Loreta Ave, MD;  Location: [facility] OR;  Service: Orthopedics;  Laterality: Left;  left total knee revision, tibial and patellar components    MEDICATIONS:  aspirin 325 MG tablet   atenolol (TENORMIN) 25 MG tablet  BD DISP NEEDLES 18G X 1-1/2" MISC   celecoxib (CELEBREX) 200 MG capsule   diclofenac Sodium (VOLTAREN) 1 % GEL   EPINEPHrine (EPIPEN 2-PAK) 0.3 mg/0.3 mL SOAJ   esomeprazole (NEXIUM) 40 MG capsule   fluticasone (FLONASE) 50 MCG/ACT nasal spray   gentamicin cream (GARAMYCIN) 0.1 %   hydrochlorothiazide (HYDRODIURIL) 25 MG tablet   lisinopril (ZESTRIL) 20 MG tablet   LORazepam (ATIVAN) 2 MG tablet   methocarbamol (ROBAXIN) 500 MG tablet   naloxone (NARCAN) nasal spray 4 mg/0.1 mL   Naphazoline HCl (CLEAR EYES OP)   NEEDLE, DISP, 18 G (BD DISP NEEDLES) 18G X 1-1/2" MISC   Nutritional Supplements (JUICE PLUS FIBRE PO)   Oxycodone HCl 10 MG TABS   tamsulosin (FLOMAX) 0.4 MG CAPS capsule   testosterone cypionate (DEPOTESTOSTERONE CYPIONATE) 200 MG/ML injection   No current facility-administered medications for this encounter.       Jodell Cipro Ward, PA-C [facility] Pre-Surgical Testing [phone]

## 2023-03-06 ENCOUNTER — Encounter (HOSPITAL_COMMUNITY)
Admission: RE | Disposition: A | Discharge status: Home / Self Care | Payer: Self-pay | Source: Ambulatory Visit | Attending: Urology

## 2023-03-06 ENCOUNTER — Other Ambulatory Visit: Payer: Self-pay

## 2023-03-06 ENCOUNTER — Encounter (HOSPITAL_COMMUNITY): Payer: Self-pay | Admitting: Urology

## 2023-03-06 ENCOUNTER — Ambulatory Visit (HOSPITAL_COMMUNITY)
Admission: RE | Admit: 2023-03-06 | Discharge: 2023-03-06 | Disposition: A | Discharge status: Home / Self Care | Payer: PPO | Source: Ambulatory Visit | Attending: Urology | Admitting: Urology

## 2023-03-06 DIAGNOSIS — E871 Hypo-osmolality and hyponatremia: Secondary | ICD-10-CM | POA: Insufficient documentation

## 2023-03-06 DIAGNOSIS — N400 Enlarged prostate without lower urinary tract symptoms: Secondary | ICD-10-CM | POA: Insufficient documentation

## 2023-03-06 DIAGNOSIS — G4733 Obstructive sleep apnea (adult) (pediatric): Secondary | ICD-10-CM | POA: Diagnosis not present

## 2023-03-06 DIAGNOSIS — Z539 Procedure and treatment not carried out, unspecified reason: Secondary | ICD-10-CM | POA: Insufficient documentation

## 2023-03-06 DIAGNOSIS — I10 Essential (primary) hypertension: Secondary | ICD-10-CM

## 2023-03-06 DIAGNOSIS — K219 Gastro-esophageal reflux disease without esophagitis: Secondary | ICD-10-CM | POA: Diagnosis not present

## 2023-03-06 LAB — BASIC METABOLIC PANEL
Anion gap: 10 (ref 5–15)
BUN: 12 mg/dL (ref 8–23)
CO2: 25 mmol/L (ref 22–32)
Calcium: 8.6 mg/dL — ABNORMAL LOW (ref 8.9–10.3)
Chloride: 92 mmol/L — ABNORMAL LOW (ref 98–111)
Creatinine, Ser: 0.87 mg/dL (ref 0.61–1.24)
GFR, Estimated: >60 mL/min (ref >60)
Glucose, Bld: 132 mg/dL — ABNORMAL HIGH (ref 70–99)
Potassium: 3.6 mmol/L (ref 3.5–5.1)
Sodium: 127 mmol/L — ABNORMAL LOW (ref 135–145)

## 2023-03-06 SURGERY — TRANSURETHRAL WATERJET ABLATION OF PROSTATE
Anesthesia: General

## 2023-03-06 MED ORDER — PROPOFOL 1000 MG/100ML IV EMUL
INTRAVENOUS | Status: AC
  Filled 2023-03-06: qty 100

## 2023-03-06 MED ORDER — ONDANSETRON HCL 4 MG/2ML IJ SOLN
INTRAMUSCULAR | Status: AC
  Filled 2023-03-06: qty 2

## 2023-03-06 MED ORDER — ORAL CARE MOUTH RINSE: 15.0000 mL | Freq: Once | OROMUCOSAL | Status: AC

## 2023-03-06 MED ORDER — DEXAMETHASONE SODIUM PHOSPHATE 10 MG/ML IJ SOLN
INTRAMUSCULAR | Status: AC
  Filled 2023-03-06: qty 1

## 2023-03-06 MED ORDER — EPHEDRINE 5 MG/ML INJ
INTRAVENOUS | Status: AC
  Filled 2023-03-06: qty 5

## 2023-03-06 MED ORDER — LIDOCAINE HCL (PF) 2 % IJ SOLN
INTRAMUSCULAR | Status: AC
  Filled 2023-03-06: qty 5

## 2023-03-06 MED ORDER — CHLORHEXIDINE GLUCONATE 0.12 % MT SOLN
15.0000 mL | Freq: Once | OROMUCOSAL | Status: AC
  Administered 2023-03-06: 15 mL via OROMUCOSAL

## 2023-03-06 MED ORDER — LACTATED RINGERS IV SOLN: INTRAVENOUS | Status: DC

## 2023-03-06 MED ORDER — GLYCOPYRROLATE 0.2 MG/ML IJ SOLN
INTRAMUSCULAR | Status: AC
  Filled 2023-03-06: qty 1

## 2023-03-06 MED ORDER — FENTANYL CITRATE (PF) 100 MCG/2ML IJ SOLN
INTRAMUSCULAR | Status: AC
  Filled 2023-03-06: qty 2

## 2023-03-06 MED ORDER — MIDAZOLAM HCL 2 MG/2ML IJ SOLN
INTRAMUSCULAR | Status: AC
  Filled 2023-03-06: qty 2

## 2023-03-06 MED ORDER — SODIUM CHLORIDE 0.9 % IV SOLN
2.0000 g | INTRAVENOUS | Status: DC
  Filled 2023-03-06: qty 20

## 2023-03-06 NOTE — OR Nursing (Signed)
CASE CANCELLED DUE TO LAB WORK

## 2023-03-06 NOTE — H&P (Signed)
H&P  Chief Complaint: BPH, lower urine tract symptoms  History of Present Illness: Raymond Herrera is a 67 year old male with a history of BPH.  He has bothersome lower urinary tract symptoms.  He has been on tamsulosin.  He had about 80 g prostate on ultrasound.  He was brought today for robotic water jet ablation of the prostate, but had hyponatremia found on his preoperative labs 02/25/2023.  This is new for him.  Hyponatremia was seen again on this mornings BMP. He has no chest pain or headache.  He gives blood for erythrocytosis and had given blood 02/24/2023.  Otherwise he feels well and has no complaints.  Past Medical History:  Diagnosis Date   Allergy    Anxiety    Arthritis    Both Knees   Chronic low back pain    had seen Dr. Sharolyn Douglas, the Workers Comp case has been settled   Depression    Family history of adverse reaction to anesthesia    sister ponv   GERD (gastroesophageal reflux disease)    Hyperlipidemia    Hypogonadism male    sees Dr. Isabel Caprice    Plantar fasciitis    hx of   Pre-diabetes    Scrotal abscess 06/06/2010   MRSA, lanced by Dr. Isabel Caprice    Sleep apnea, obstructive    sees Dr. Shelle Iron, uses CPAP  setting of 12   Past Surgical History:  Procedure Laterality Date   ANKLE ARTHROSCOPY Left 07/05/2021   Procedure: LEFT ANKLE ARTHROSCOPY, DEBRIDEMENT OSTEOCHONDRAL DEFECT, partial synovectomy;  Surgeon: Eldred Manges, MD;  Location: [facility] OR;  Service: Orthopedics;  Laterality: Left;   APPENDECTOMY     BACK SURGERY  2016   Laminectomy   CARPAL TUNNEL RELEASE Left 02/14/2014   per Dr. Richardson Landry    COLONOSCOPY  2008   clear, repeat in 10 yrs    COLONOSCOPY WITH PROPOFOL N/A 11/06/2016   Procedure: COLONOSCOPY WITH PROPOFOL;  Surgeon: Rachael Fee, MD;  [facility];  Service: Endoscopy;  Laterality: N/A;   JOINT REPLACEMENT  20060,2007   bilateral knees, Dr. Eulah Pont    KNEE ARTHROSCOPY Bilateral    RADIOFREQUENCY ABLATION  02/2011   x 2   Scrotal Abcess      I/D - healed   TONSILLECTOMY     TOTAL KNEE REVISION  10/01/2011   Procedure: TOTAL KNEE REVISION;  Surgeon: Loreta Ave, MD;  Location: [facility] OR;  Service: Orthopedics;  Laterality: Left;  left total knee revision, tibial and patellar components    Home Medications:  Medications Prior to Admission  Medication Sig Dispense Refill Last Dose   aspirin 325 MG tablet Take 650 mg by mouth every 6 (six) hours as needed for moderate pain.   02/23/2023   atenolol (TENORMIN) 25 MG tablet Take 25 mg by mouth 2 (two) times daily.   03/06/2023 at 0500   diclofenac Sodium (VOLTAREN) 1 % GEL Apply 1 application topically 4 (four) times daily as needed (pain).   Past Week   esomeprazole (NEXIUM) 40 MG capsule take twice a day (Patient taking differently: Take 40 mg by mouth See admin instructions. Take 40 mg daily, may take a second 40 mg dose as needed for acid reflux) 60 capsule 11 03/06/2023 at 0500   fluticasone (FLONASE) 50 MCG/ACT nasal spray USE 2 SPRAYS IN EACH NOSTRIL ONCE DAILY (Patient taking differently: Place 2 sprays into both nostrils daily as needed for allergies.) 16 g 1 03/06/2023   hydrochlorothiazide (HYDRODIURIL)  25 MG tablet Take 25 mg by mouth every morning.   03/05/2023   lisinopril (ZESTRIL) 20 MG tablet Take 20 mg by mouth 2 (two) times daily.   03/05/2023   LORazepam (ATIVAN) 2 MG tablet Take 2 mg by mouth 3 (three) times daily.   03/06/2023 at 0500   methocarbamol (ROBAXIN) 500 MG tablet Take 1 tablet (500 mg total) by mouth 4 (four) times daily. (Patient taking differently: Take 500 mg by mouth 3 (three) times daily as needed for muscle spasms.) 360 tablet 1 03/05/2023   naloxone (NARCAN) nasal spray 4 mg/0.1 mL Place 1 spray into the nose as needed (opioid overdose).   Has not needed   Naphazoline HCl (CLEAR EYES OP) Place 1 drop into both eyes daily as needed (redness).   Past Month   Nutritional Supplements (JUICE PLUS FIBRE PO) Take 6 capsules by mouth daily. Take 2 Fruits  + 2 Vegetables + 2 Berry   02/04/2023   Oxycodone HCl 10 MG TABS Take 1 tablet (10 mg total) by mouth every 4 (four) hours as needed (pain). 20 tablet 0 03/06/2023 at 0500   tamsulosin (FLOMAX) 0.4 MG CAPS capsule Take 0.4 mg by mouth daily.   03/05/2023   testosterone cypionate (DEPOTESTOSTERONE CYPIONATE) 200 MG/ML injection Inject 100 mg into the muscle every Friday.   03/01/2023   BD DISP NEEDLES 18G X 1-1/2" MISC    03/01/2023   celecoxib (CELEBREX) 200 MG capsule Take 1 capsule (200 mg total) by mouth 2 (two) times daily. (Patient taking differently: Take 200 mg by mouth daily as needed for moderate pain.) 180 capsule 3 More than a month   EPINEPHrine (EPIPEN 2-PAK) 0.3 mg/0.3 mL SOAJ Inject 0.3 mLs (0.3 mg total) into the muscle once. 2 Device 5 More than a month   gentamicin cream (GARAMYCIN) 0.1 % Apply 1 application. topically 2 (two) times daily. (Patient not taking: Reported on 02/18/2023) 30 g 1 Not Taking   NEEDLE, DISP, 18 G (BD DISP NEEDLES) 18G X 1-1/2" MISC    03/01/2023   Allergies:  Allergies  Allergen Reactions   Bee Venom Anaphylaxis and Swelling   Lyrica [Pregabalin] Swelling    Arm swelling    Tramadol Itching    Family History  Problem Relation Age of Onset   Stroke Father    Anesthesia problems Neg Hx    Social History:  reports that he has never smoked. He has been exposed to tobacco smoke. He has never used smokeless tobacco. He reports current alcohol use of about 6.0 standard drinks of alcohol per week. He reports that he does not use drugs.  ROS: A complete review of systems was performed.  All systems are negative except for pertinent findings as noted. Review of Systems  All other systems reviewed and are negative.    Physical Exam:  Vital signs in last 24 hours: Temp:  [98.1 F (36.7 C)] 98.1 F (36.7 C) (10/11 0610) Pulse Rate:  [64] 64 (10/11 0610) Resp:  [14] 14 (10/11 0610) BP: (132)/(82) 132/82 (10/11 0610) SpO2:  [95 %] 95 % (10/11  0610) Weight:  [165.6 kg] 165.6 kg (10/11 0634) General:  Alert and oriented, No acute distress HEENT: Normocephalic, atraumatic Cardiovascular: Regular rate and rhythm Lungs: Regular rate and effort Abdomen: Soft, nontender, nondistended, no abdominal masses Back: No CVA tenderness Extremities: No edema Neurologic: Grossly intact  Laboratory Data:  Results for orders placed or performed during the hospital encounter of 03/06/23 (from the past 24 hour(s))  Basic metabolic panel     Status: Abnormal   Collection Time: 03/06/23  6:25 AM  Result Value Ref Range   Sodium 127 (L) 135 - 145 mmol/L   Potassium 3.6 3.5 - 5.1 mmol/L   Chloride 92 (L) 98 - 111 mmol/L   CO2 25 22 - 32 mmol/L   Glucose, Bld 132 (H) 70 - 99 mg/dL   BUN 12 8 - 23 mg/dL   Creatinine, Ser 1.61 0.61 - 1.24 mg/dL   Calcium 8.6 (L) 8.9 - 10.3 mg/dL   GFR, Estimated >09 >60 mL/min   Anion gap 10 5 - 15   Recent Results (from the past 240 hour(s))  Surgical pcr screen     Status: None   Collection Time: 02/25/23 11:05 AM   Specimen: Nasal Mucosa; Nasal Swab  Result Value Ref Range Status   MRSA, PCR NEGATIVE NEGATIVE Final   Staphylococcus aureus NEGATIVE NEGATIVE Final    Comment: (NOTE) The Xpert SA Assay (FDA approved for NASAL specimens in patients 19 years of age and older), is one component of a comprehensive surveillance program. It is not intended to diagnose infection nor to guide or monitor treatment. Performed at [facility], [address] [place], [place] [address]    Creatinine: Recent Labs    03/06/23 8119  CREATININE 0.87    Impression/Assessment:  BPH with lower urinary tract symptoms, hyponatremia-  Plan:  Patient to undergo elective robotic water jet ablation.  Will cancel case today and he will see his PCP for evaluation.  Jerilee Field 03/06/2023

## 2023-03-25 ENCOUNTER — Other Ambulatory Visit: Payer: Self-pay | Admitting: Urology

## 2023-04-13 NOTE — Progress Notes (Signed)
COVID Vaccine Completed: yes  Date of COVID positive in last 90 days:  PCP - Delano Metz, MD Cardiologist - Sherryl Manges LOV 04/18/20 for CP, no f/u needed  Chest x-ray -  EKG - 02/25/23 Epic Stress Test - 2016 ECHO -  Cardiac Cath -  Pacemaker/ICD device last checked: Spinal Cord Stimulator:  Bowel Prep -   Sleep Study -  CPAP -   Fasting Blood Sugar - preDM Checks Blood Sugar _____ times a day  Last dose of GLP1 agonist-  N/A GLP1 instructions:  Hold 7 days before surgery    Last dose of SGLT-2 inhibitors-  N/A SGLT-2 instructions:  Hold 3 days before surgery    Blood Thinner Instructions:  Time Aspirin Instructions: ASA 325 Last Dose:  Activity level:  Can go up a flight of stairs and perform activities of daily living without stopping and without symptoms of chest pain or shortness of breath.  Able to exercise without symptoms  Unable to go up a flight of stairs without symptoms of     Anesthesia review:   Patient denies shortness of breath, fever, cough and chest pain at PAT appointment  Patient verbalized understanding of instructions that were given to them at the PAT appointment. Patient was also instructed that they will need to review over the PAT instructions again at home before surgery.

## 2023-04-13 NOTE — Patient Instructions (Signed)
SURGICAL WAITING ROOM VISITATION  Patients having surgery or a procedure may have no more than 2 support people in the waiting area - these visitors may rotate.    Children under the age of 27 must have an adult with them who is not the patient.  Due to an increase in RSV and influenza rates and associated hospitalizations, children ages 63 and under may not visit patients in [facility] hospitals.  If the patient needs to stay at the hospital during part of their recovery, the visitor guidelines for inpatient rooms apply. Pre-op nurse will coordinate an appropriate time for 1 support person to accompany patient in pre-op.  This support person may not rotate.    Please refer to the [facility] website for the visitor guidelines for Inpatients (after your surgery is over and you are in a regular room).    Your procedure is scheduled on: 04/28/23   Report to [facility] Main Entrance    Report to admitting at 7:15 AM   Call this number if you have problems the morning of surgery [phone]   Do not eat food or drink liquids :After Midnight.          If you have questions, please contact your surgeon's office.   FOLLOW BOWEL PREP AND ANY ADDITIONAL PRE OP INSTRUCTIONS YOU RECEIVED FROM YOUR SURGEON'S OFFICE!!!     Oral Hygiene is also important to reduce your risk of infection.                                    Remember - BRUSH YOUR TEETH THE MORNING OF SURGERY WITH YOUR REGULAR TOOTHPASTE  DENTURES WILL BE REMOVED PRIOR TO SURGERY PLEASE DO NOT APPLY "Poly grip" OR ADHESIVES!!!   Stop all vitamins and herbal supplements 7 days before surgery.   Take these medicines the morning of surgery with A SIP OF WATER: Atenolol, Nexium, Flonase, Lorazepam, Oxycodone, Tamsulosin                               You may not have any metal on your body including jewelry, and body piercing             Do not wear lotions, powders, cologne, or deodorant              Men may shave face  and neck.   Do not bring valuables to the hospital. [facility] IS NOT             RESPONSIBLE   FOR VALUABLES.   Contacts, glasses, dentures or bridgework may not be worn into surgery.  DO NOT BRING YOUR HOME MEDICATIONS TO THE HOSPITAL. PHARMACY WILL DISPENSE MEDICATIONS LISTED ON YOUR MEDICATION LIST TO YOU DURING YOUR ADMISSION IN THE HOSPITAL!    Patients discharged on the day of surgery will not be allowed to drive home.  Someone NEEDS to stay with you for the first 24 hours after anesthesia.              Please read over the following fact sheets you were given: IF YOU HAVE QUESTIONS ABOUT YOUR PRE-OP INSTRUCTIONS PLEASE CALL [phone] [address]ras   If you received a COVID test during your pre-op visit  it is requested that you wear a mask when out in public, stay away from anyone that may not be feeling well and  notify your surgeon if you develop symptoms. If you test positive for Covid or have been in contact with anyone that has tested positive in the last 10 days please notify you surgeon.    [facility] - Preparing for Surgery Before surgery, you can play an important role.  Because skin is not sterile, your skin needs to be as free of germs as possible.  You can reduce the number of germs on your skin by washing with CHG (chlorahexidine gluconate) soap before surgery.  CHG is an antiseptic cleaner which kills germs and bonds with the skin to continue killing germs even after washing. Please DO NOT use if you have an allergy to CHG or antibacterial soaps.  If your skin becomes reddened/irritated stop using the CHG and inform your nurse when you arrive at Short Stay. Do not shave (including legs and underarms) for at least 48 hours prior to the first CHG shower.  You may shave your face/neck.  Please follow these instructions carefully:  1.  Shower with CHG Soap the night before surgery and the  morning of surgery.  2.  If you choose to wash your hair, wash your hair first as usual  with your normal  shampoo.  3.  After you shampoo, rinse your hair and body thoroughly to remove the shampoo.                             4.  Use CHG as you would any other liquid soap.  You can apply chg directly to the skin and wash.  Gently with a scrungie or clean washcloth.  5.  Apply the CHG Soap to your body ONLY FROM THE NECK DOWN.   Do   not use on face/ open                           Wound or open sores. Avoid contact with eyes, ears mouth and   genitals (private parts).                       Wash face,  Genitals (private parts) with your normal soap.             6.  Wash thoroughly, paying special attention to the area where your    surgery  will be performed.  7.  Thoroughly rinse your body with warm water from the neck down.  8.  DO NOT shower/wash with your normal soap after using and rinsing off the CHG Soap.                9.  Pat yourself dry with a clean towel.            10.  Wear clean pajamas.            11.  Place clean sheets on your bed the night of your first shower and do not  sleep with pets. Day of Surgery : Do not apply any lotions/deodorants the morning of surgery.  Please wear clean clothes to the hospital/surgery center.  FAILURE TO FOLLOW THESE INSTRUCTIONS MAY RESULT IN THE CANCELLATION OF YOUR SURGERY  PATIENT SIGNATURE_________________________________  NURSE SIGNATURE__________________________________  ________________________________________________________________________

## 2023-04-14 ENCOUNTER — Encounter (HOSPITAL_COMMUNITY): Payer: Self-pay

## 2023-04-14 ENCOUNTER — Encounter (HOSPITAL_COMMUNITY)
Admission: RE | Admit: 2023-04-14 | Discharge: 2023-04-14 | Disposition: A | Discharge status: Home / Self Care | Payer: PPO | Source: Ambulatory Visit | Attending: Urology

## 2023-04-14 ENCOUNTER — Other Ambulatory Visit: Payer: Self-pay

## 2023-04-14 VITALS — BP 145/93 | HR 60 | Temp 97.7°F | Resp 18 | Ht 75.0 in | Wt 384.0 lb

## 2023-04-14 DIAGNOSIS — Z01812 Encounter for preprocedural laboratory examination: Secondary | ICD-10-CM | POA: Insufficient documentation

## 2023-04-14 DIAGNOSIS — I10 Essential (primary) hypertension: Secondary | ICD-10-CM | POA: Diagnosis not present

## 2023-04-14 HISTORY — DX: Essential (primary) hypertension: I10

## 2023-04-14 LAB — BASIC METABOLIC PANEL
Anion gap: 8 (ref 5–15)
BUN: 10 mg/dL (ref 8–23)
CO2: 24 mmol/L (ref 22–32)
Calcium: 8.7 mg/dL — ABNORMAL LOW (ref 8.9–10.3)
Chloride: 104 mmol/L (ref 98–111)
Creatinine, Ser: 0.82 mg/dL (ref 0.61–1.24)
GFR, Estimated: >60 mL/min (ref >60)
Glucose, Bld: 111 mg/dL — ABNORMAL HIGH (ref 70–99)
Potassium: 4 mmol/L (ref 3.5–5.1)
Sodium: 136 mmol/L (ref 135–145)

## 2023-04-14 LAB — CBC
HCT: 45.5 % (ref 39.0–52.0)
Hemoglobin: 15.7 g/dL (ref 13.0–17.0)
MCH: 31.6 pg (ref 26.0–34.0)
MCHC: 34.5 g/dL (ref 30.0–36.0)
MCV: 91.5 fL (ref 80.0–100.0)
Platelets: 202 10*3/uL (ref 150–400)
RBC: 4.97 MIL/uL (ref 4.22–5.81)
RDW: 13.4 % (ref 11.5–15.5)
WBC: 6.9 10*3/uL (ref 4.0–10.5)
nRBC: 0 % (ref 0.0–0.2)

## 2023-04-15 NOTE — Progress Notes (Signed)
Patient called to update that he has started on clonidine 0.1mg  BID. Instructed he can take DOS and added to medication list.

## 2023-04-28 ENCOUNTER — Encounter (HOSPITAL_COMMUNITY): Payer: Self-pay | Admitting: Urology

## 2023-04-28 ENCOUNTER — Other Ambulatory Visit: Payer: Self-pay

## 2023-04-28 ENCOUNTER — Ambulatory Visit (HOSPITAL_BASED_OUTPATIENT_CLINIC_OR_DEPARTMENT_OTHER): Payer: PPO | Admitting: Certified Registered"

## 2023-04-28 ENCOUNTER — Ambulatory Visit (HOSPITAL_COMMUNITY)
Admission: RE | Admit: 2023-04-28 | Discharge: 2023-04-28 | Disposition: A | Discharge status: Home / Self Care | Payer: PPO | Attending: Urology | Admitting: Urology

## 2023-04-28 ENCOUNTER — Encounter (HOSPITAL_COMMUNITY)
Admission: RE | Disposition: A | Discharge status: Home / Self Care | Payer: Self-pay | Source: Home / Self Care | Attending: Urology

## 2023-04-28 ENCOUNTER — Ambulatory Visit (HOSPITAL_COMMUNITY): Payer: PPO | Admitting: Physician Assistant

## 2023-04-28 DIAGNOSIS — I10 Essential (primary) hypertension: Secondary | ICD-10-CM | POA: Diagnosis not present

## 2023-04-28 DIAGNOSIS — N400 Enlarged prostate without lower urinary tract symptoms: Secondary | ICD-10-CM

## 2023-04-28 DIAGNOSIS — N401 Enlarged prostate with lower urinary tract symptoms: Secondary | ICD-10-CM | POA: Diagnosis present

## 2023-04-28 DIAGNOSIS — Z7722 Contact with and (suspected) exposure to environmental tobacco smoke (acute) (chronic): Secondary | ICD-10-CM | POA: Insufficient documentation

## 2023-04-28 DIAGNOSIS — R351 Nocturia: Secondary | ICD-10-CM | POA: Insufficient documentation

## 2023-04-28 DIAGNOSIS — G4733 Obstructive sleep apnea (adult) (pediatric): Secondary | ICD-10-CM | POA: Insufficient documentation

## 2023-04-28 DIAGNOSIS — R3912 Poor urinary stream: Secondary | ICD-10-CM | POA: Diagnosis not present

## 2023-04-28 SURGERY — ABLATION, PROSTATE, TRANSURETHRAL, USING WATERJET
Anesthesia: General

## 2023-04-28 MED ORDER — CHLORHEXIDINE GLUCONATE 0.12 % MT SOLN
15.0000 mL | Freq: Once | OROMUCOSAL | Status: AC
Start: 2023-04-28 — End: 2023-04-28
  Administered 2023-04-28: 15 mL via OROMUCOSAL

## 2023-04-28 MED ORDER — MIDAZOLAM HCL 2 MG/2ML IJ SOLN
INTRAMUSCULAR | Status: AC
  Filled 2023-04-28: qty 2

## 2023-04-28 MED ORDER — TRANEXAMIC ACID-NACL 1000-0.7 MG/100ML-% IV SOLN
INTRAVENOUS | Status: AC
  Filled 2023-04-28: qty 100

## 2023-04-28 MED ORDER — OXYCODONE HCL 5 MG PO TABS
ORAL_TABLET | ORAL | Status: AC
  Administered 2023-04-28: 5 mg via ORAL
  Filled 2023-04-28: qty 1

## 2023-04-28 MED ORDER — TRANEXAMIC ACID-NACL 1000-0.7 MG/100ML-% IV SOLN
INTRAVENOUS | Status: DC | PRN
Start: 2023-04-28 — End: 2023-04-28
  Administered 2023-04-28: 1000 mg via INTRAVENOUS

## 2023-04-28 MED ORDER — MIDAZOLAM HCL 2 MG/2ML IJ SOLN
INTRAMUSCULAR | Status: DC | PRN
  Administered 2023-04-28: 2 mg via INTRAVENOUS

## 2023-04-28 MED ORDER — LACTATED RINGERS IV SOLN: INTRAVENOUS | Status: DC

## 2023-04-28 MED ORDER — SUGAMMADEX SODIUM 500 MG/5ML IV SOLN
INTRAVENOUS | Status: DC | PRN
  Administered 2023-04-28: 400 mg via INTRAVENOUS

## 2023-04-28 MED ORDER — ONDANSETRON HCL 4 MG/2ML IJ SOLN: 4.0000 mg | Freq: Four times a day (QID) | INTRAMUSCULAR | Status: DC | PRN

## 2023-04-28 MED ORDER — SODIUM CHLORIDE 0.9 % IV SOLN
2.0000 g | INTRAVENOUS | Status: AC
  Administered 2023-04-28: 2 g via INTRAVENOUS
  Filled 2023-04-28: qty 20

## 2023-04-28 MED ORDER — ASPIRIN 325 MG PO TABS: 650.0000 mg | ORAL_TABLET | Freq: Four times a day (QID) | ORAL | Status: AC | PRN

## 2023-04-28 MED ORDER — ONDANSETRON HCL 4 MG/2ML IJ SOLN
INTRAMUSCULAR | Status: DC | PRN
  Administered 2023-04-28: 4 mg via INTRAVENOUS

## 2023-04-28 MED ORDER — ORAL CARE MOUTH RINSE: 15.0000 mL | Freq: Once | OROMUCOSAL | Status: AC

## 2023-04-28 MED ORDER — PROPOFOL 10 MG/ML IV BOLUS
INTRAVENOUS | Status: AC
  Filled 2023-04-28: qty 20

## 2023-04-28 MED ORDER — OXYBUTYNIN CHLORIDE 5 MG PO TABS: 5.0000 mg | ORAL_TABLET | Freq: Three times a day (TID) | ORAL | 0 refills | Status: DC | PRN

## 2023-04-28 MED ORDER — DEXAMETHASONE SODIUM PHOSPHATE 10 MG/ML IJ SOLN
INTRAMUSCULAR | Status: AC
  Filled 2023-04-28: qty 1

## 2023-04-28 MED ORDER — DEXAMETHASONE SODIUM PHOSPHATE 10 MG/ML IJ SOLN
INTRAMUSCULAR | Status: DC | PRN
  Administered 2023-04-28: 10 mg via INTRAVENOUS

## 2023-04-28 MED ORDER — FENTANYL CITRATE PF 50 MCG/ML IJ SOSY
PREFILLED_SYRINGE | INTRAMUSCULAR | Status: AC
  Administered 2023-04-28: 50 ug via INTRAVENOUS
  Filled 2023-04-28: qty 3

## 2023-04-28 MED ORDER — LIDOCAINE HCL (PF) 2 % IJ SOLN
INTRAMUSCULAR | Status: AC
  Filled 2023-04-28: qty 5

## 2023-04-28 MED ORDER — LABETALOL HCL 5 MG/ML IV SOLN
INTRAVENOUS | Status: AC
  Filled 2023-04-28: qty 4

## 2023-04-28 MED ORDER — OXYCODONE HCL 5 MG PO TABS: 5.0000 mg | ORAL_TABLET | Freq: Once | ORAL | Status: AC | PRN

## 2023-04-28 MED ORDER — SODIUM CHLORIDE 0.9 % IR SOLN
Status: DC | PRN
  Administered 2023-04-28: 18000 mL via INTRAVESICAL

## 2023-04-28 MED ORDER — KETAMINE HCL 50 MG/5ML IJ SOSY
PREFILLED_SYRINGE | INTRAMUSCULAR | Status: DC | PRN
Start: 2023-04-28 — End: 2023-04-28
  Administered 2023-04-28: 25 mg via INTRAVENOUS

## 2023-04-28 MED ORDER — FENTANYL CITRATE (PF) 100 MCG/2ML IJ SOLN
INTRAMUSCULAR | Status: DC | PRN
  Administered 2023-04-28: 100 ug via INTRAVENOUS

## 2023-04-28 MED ORDER — KETAMINE HCL 50 MG/5ML IJ SOSY
PREFILLED_SYRINGE | INTRAMUSCULAR | Status: AC
  Filled 2023-04-28: qty 5

## 2023-04-28 MED ORDER — ROCURONIUM BROMIDE 100 MG/10ML IV SOLN
INTRAVENOUS | Status: DC | PRN
  Administered 2023-04-28: 60 mg via INTRAVENOUS
  Administered 2023-04-28: 30 mg via INTRAVENOUS

## 2023-04-28 MED ORDER — CEPHALEXIN 500 MG PO CAPS: 500.0000 mg | ORAL_CAPSULE | Freq: Every day | ORAL | 0 refills | Status: AC

## 2023-04-28 MED ORDER — PROPOFOL 10 MG/ML IV BOLUS
INTRAVENOUS | Status: DC | PRN
  Administered 2023-04-28: 200 mg via INTRAVENOUS

## 2023-04-28 MED ORDER — FENTANYL CITRATE (PF) 100 MCG/2ML IJ SOLN
INTRAMUSCULAR | Status: AC
  Filled 2023-04-28: qty 2

## 2023-04-28 MED ORDER — LABETALOL HCL 5 MG/ML IV SOLN
INTRAVENOUS | Status: DC | PRN
  Administered 2023-04-28 (×2): 10 mg via INTRAVENOUS

## 2023-04-28 MED ORDER — ROCURONIUM BROMIDE 10 MG/ML (PF) SYRINGE
PREFILLED_SYRINGE | INTRAVENOUS | Status: AC
  Filled 2023-04-28: qty 30

## 2023-04-28 MED ORDER — ASPIRIN 325 MG PO TABS: 650.0000 mg | ORAL_TABLET | Freq: Four times a day (QID) | ORAL | Status: DC | PRN

## 2023-04-28 MED ORDER — ONDANSETRON HCL 4 MG/2ML IJ SOLN
INTRAMUSCULAR | Status: AC
  Filled 2023-04-28: qty 2

## 2023-04-28 MED ORDER — STERILE WATER FOR IRRIGATION IR SOLN
Status: DC | PRN
  Administered 2023-04-28: 500 mL

## 2023-04-28 MED ORDER — FENTANYL CITRATE PF 50 MCG/ML IJ SOSY
25.0000 ug | PREFILLED_SYRINGE | INTRAMUSCULAR | Status: DC | PRN
  Administered 2023-04-28 (×2): 50 ug via INTRAVENOUS

## 2023-04-28 MED ORDER — OXYCODONE HCL 5 MG/5ML PO SOLN: 5.0000 mg | Freq: Once | ORAL | Status: AC | PRN

## 2023-04-28 MED ORDER — LIDOCAINE HCL (CARDIAC) PF 100 MG/5ML IV SOSY
PREFILLED_SYRINGE | INTRAVENOUS | Status: DC | PRN
  Administered 2023-04-28: 60 mg via INTRATRACHEAL

## 2023-04-28 SURGICAL SUPPLY — 28 items
BAG URINE DRAIN 2000ML AR STRL (UROLOGICAL SUPPLIES) ×1 IMPLANT
BAND RUBBER #18 3X1/16 STRL (MISCELLANEOUS) IMPLANT
CANISTER SUCT 3000ML PPV (MISCELLANEOUS) ×1 IMPLANT
CATH HEMA 3WAY 30CC 22FR COUDE (CATHETERS) IMPLANT
CATH HEMA 3WAY 30CC 24FR COUDE (CATHETERS) IMPLANT
COVER MAYO STAND STRL (DRAPES) ×1 IMPLANT
DRAPE FOOT SWITCH (DRAPES) ×1 IMPLANT
DRAPE SURG IRRIG POUCH 19X23 (DRAPES) IMPLANT
GEL ULTRASOUND 8.5O AQUASONIC (MISCELLANEOUS) ×1 IMPLANT
GLOVE SURG LX STRL 7.5 STRW (GLOVE) ×1 IMPLANT
GOWN STRL REUS W/ TWL XL LVL3 (GOWN DISPOSABLE) ×1 IMPLANT
HANDPIECE AQUABEAM (MISCELLANEOUS) ×1 IMPLANT
HOLDER FOLEY CATH W/STRAP (MISCELLANEOUS) IMPLANT
KIT TURNOVER KIT A (KITS) IMPLANT
LOOP CUT BIPOLAR 24F LRG (ELECTROSURGICAL) IMPLANT
MANIFOLD NEPTUNE II (INSTRUMENTS) ×1 IMPLANT
MAT ABSORB FLUID 56X50 GRAY (MISCELLANEOUS) ×1 IMPLANT
PACK CYSTO (CUSTOM PROCEDURE TRAY) ×1 IMPLANT
PACK DRAPE AQUABEAM (MISCELLANEOUS) ×1 IMPLANT
PAD PREP 24X48 CUFFED NSTRL (MISCELLANEOUS) ×1 IMPLANT
PIN SAFETY STERILE (MISCELLANEOUS) IMPLANT
SYR 30ML LL (SYRINGE) ×1 IMPLANT
SYR TOOMEY IRRIG 70ML (MISCELLANEOUS) ×2
SYRINGE TOOMEY IRRIG 70ML (MISCELLANEOUS) ×2 IMPLANT
TOWEL OR 17X26 10 PK STRL BLUE (TOWEL DISPOSABLE) ×1 IMPLANT
TUBING CONNECTING 10 (TUBING) ×2 IMPLANT
TUBING UROLOGY SET (TUBING) ×1 IMPLANT
UNDERPAD 30X36 HEAVY ABSORB (UNDERPADS AND DIAPERS) ×1 IMPLANT

## 2023-04-28 NOTE — Transfer of Care (Signed)
Immediate Anesthesia Transfer of Care Note  Patient: Raymond Herrera  Procedure(s) Performed: TRANSURETHRAL WATERJET ABLATION OF PROSTATE  Patient Location: PACU  Anesthesia Type:General  Level of Consciousness: awake, alert , oriented, and patient cooperative  Airway & Oxygen Therapy: Patient Spontanous Breathing and Patient connected to face mask oxygen  Post-op Assessment: Report given to RN and Post -op Vital signs reviewed and stable  Post vital signs: Reviewed and stable  Last Vitals:  Vitals Value Taken Time  BP 134/89 04/28/23 0915  Temp    Pulse 74 04/28/23 0921  Resp 15 04/28/23 0921  SpO2 97 % 04/28/23 0921  Vitals shown include unfiled device data.  Last Pain:  Vitals:   04/28/23 0608  TempSrc:   PainSc: 6          Complications: No notable events documented.

## 2023-04-28 NOTE — Anesthesia Procedure Notes (Signed)
Procedure Name: Intubation Date/Time: 04/28/2023 7:43 AM  Performed by: Donna Bernard, CRNAPre-anesthesia Checklist: Patient identified, Emergency Drugs available, Suction available, Patient being monitored and Timeout performed Patient Re-evaluated:Patient Re-evaluated prior to induction Oxygen Delivery Method: Circle system utilized Preoxygenation: Pre-oxygenation with 100% oxygen Induction Type: IV induction Ventilation: Mask ventilation without difficulty Laryngoscope Size: Mac, 4 and Glidescope Tube type: Oral Tube size: 7.5 mm Number of attempts: 1 Airway Equipment and Method: Video-laryngoscopy and Stylet Placement Confirmation: positive ETCO2, ETT inserted through vocal cords under direct vision, CO2 detector and breath sounds checked- equal and bilateral Secured at: 24 cm Tube secured with: Tape Dental Injury: Teeth and Oropharynx as per pre-operative assessment

## 2023-04-28 NOTE — Op Note (Signed)
Preoperative diagnosis: BPH with lower urinary tract symptoms, weak stream, frequency  Postoperative diagnosis: Same   Procedure: Robotic water jet ablation of the prostate   Surgeon: Mena Goes   Anesthesia: General   Indication for procedure:   Findings:  Penis circumcised without mass or lesion.  The glans and meatus appeared normal.  No lesion.  On DRE prostate was about 50 g and smooth without hard area or nodule.  Cystoscopy revealed tall obstructing lateral lobes.  Ureteral orifices identified after water vapor ablation and after any resection and noted to be normal.  No injury.  Description of procedure:  He was brought to the operating room and placed supine on the operating table.  After adequate anesthesia he was placed lithotomy position. Timeout was performed to confirm the patient and procedure. The TRUS Stepper was mounted to the Articulating Arm and secured to OR bed. The ultrasound probe was attached to the stepper. Exam under anesthesia was performed and the TRUS was inserted per rectum.  There was no resistance. The ultrasound probe was aligned, and confirmation made that the prostate is centered and aligned using both transverse and sagittal views. The bladder neck, verumontanum and the central/transition zones were identified.  Genitalia were prepped and draped in the usual sterile fashion. The 4F AQUABEAM Handpiece is inserted into the prostatic urethra and a complete cystoscopic evaluation was performed by inspecting the prostate, bladder, and identifying the location of the verumontanum/external sphincter. The AQUABEAM Handpiece was secured to the Handpiece Articulating Arm. Confirmed alignment of AQUABEAM Handpiece and TRUS Probe to be parallel and colinear. Confirmation that AQUABEAM nozzle is centered and anterior of the bladder neck or the median lobe. The cystoscope was then retracted to visualize the verumontanum and external sphincter and the cystoscope tip was  positioned just proximal to the external sphincter. Reconfirmed alignment of the TRUS probe with the AQUABEAM Handpiece and compression applied with TRUS probe. Horizontal alignment of the Handpiece waterjet nozzle was performed. The Aquablation treatment zones were planned utilizing real-time TRUS to visualize the contour of the prostate and the depth and radial angles of resection were defined in the transverse view. In the sagittal view, the AQUABEAM nozzle is identified and position registered with software. The treatment contours were then adjusted to conform to the intended resection margins. The median lobe, bladder neck and verumontanum were marked and confirmed in the treatment contour. The Aquablation Treatment was then started following the resection contour confirmed under ultrasound guidance.  TOTAL AQUABLATION RESECTION TIME: 1st pass 3:35, 2nd pass 3:42.  Once Aquablation resection was complete the 24 French aqua beam handpiece was carefully removed.  The continuous-flow sheath with the visual obturator was passed and then the loop and handle.  The trigone and the ureteral orifices were identified.  Resection of some of the residual prostate tissue was done and the bladder neck was identified at 6:00 and this was taken up to 12:00 with fulguration of the bladder neck and prostate for hemostasis.  Slight amount of anterior tissue was resected.  Similarly from 6:00 up to 12:00 on the left side of the bladder neck was identified by resecting some of the ablated tissue to identify the bladder neck and cauterize any bleeding.  Some anterior tissue on the left was resected.  This created excellent hemostasis.  All the chips were evacuated.  Ureteral orifices again identified and noted to be normal without injury.  The scope was backed out and a 24 [place] hematuria catheter was placed with 30 cc  in the balloon.  The balloon was seated at the bladder neck and it was irrigated on light traction and noted  to be clear to pink.  He was hooked up to CBI.  He was cleaned up and placed supine.  Catheter was placed on traction.  He was awakened and taken to the cover room in stable condition.  Complications: None  Blood loss: 50 mL  Specimens: None  Drains: 22 French three-way hematuria catheter with 30 cc in the balloon  Disposition: Patient stable to PACU

## 2023-04-28 NOTE — H&P (Signed)
H&P  Chief Complaint: BPH, lower urinary tract symptoms  History of Present Illness: Raymond Herrera is a 67 year old male with a history of BPH.  His prostate measured 60 to 80 g on ultrasound and then MRI respectively by 2020.  More recently he had a prostate ultrasound June 2024 with a 78 g prostate.  Complaining of straining to urinate, weak stream, frequency urgency.  Also nocturia and postvoid dribble.  He also complains of double voiding.  He is on tamsulosin and would like to have improvement in his lower urinary tract symptoms and stop medications.  He like to preserve ejaculation.  He had a high-pressure high flow on a UroCuff.  Cystoscopy revealed tall obstructing lateral lobes.  He presents today for robotic water jet ablation of the prostate.  His preop labs were stable.  He had a "drippy" nose and has been on a Z-Pak, but no cough or congestion. His upper resp symptoms resolved. No fever.  No dysuria or gross hematuria.  He has had no chest pain or shortness of breath.     Past Medical History:  Diagnosis Date   Allergy    Anxiety    Arthritis    Both Knees   Chronic low back pain    had seen Dr. Sharolyn Douglas, the Workers Comp case has been settled   Depression    Family history of adverse reaction to anesthesia    sister ponv   GERD (gastroesophageal reflux disease)    Hyperlipidemia    Hypertension    Hypogonadism male    sees Dr. Isabel Caprice    Plantar fasciitis    hx of   Pre-diabetes    Scrotal abscess 06/06/2010   MRSA, lanced by Dr. Isabel Caprice    Sleep apnea, obstructive    sees Dr. Shelle Iron, uses CPAP  setting of 12   Past Surgical History:  Procedure Laterality Date   ANKLE ARTHROSCOPY Left 07/05/2021   Procedure: LEFT ANKLE ARTHROSCOPY, DEBRIDEMENT OSTEOCHONDRAL DEFECT, partial synovectomy;  Surgeon: Eldred Manges, MD;  Location: [facility] OR;  Service: Orthopedics;  Laterality: Left;   APPENDECTOMY     BACK SURGERY  2016   Laminectomy   CARPAL TUNNEL RELEASE Left 02/14/2014   per  Dr. Richardson Landry    COLONOSCOPY  2008   clear, repeat in 10 yrs    COLONOSCOPY WITH PROPOFOL N/A 11/06/2016   Procedure: COLONOSCOPY WITH PROPOFOL;  Surgeon: Rachael Fee, MD;  Location: [facility] ENDOSCOPY;  Service: Endoscopy;  Laterality: N/A;   JOINT REPLACEMENT  20060,2007   bilateral knees, Dr. Eulah Pont    KNEE ARTHROSCOPY Bilateral    RADIOFREQUENCY ABLATION  02/2011   x 4   Scrotal Abcess     I/D - healed   TONSILLECTOMY     TOTAL KNEE REVISION  10/01/2011   Procedure: TOTAL KNEE REVISION;  Surgeon: Loreta Ave, MD;  Location: [facility] OR;  Service: Orthopedics;  Laterality: Left;  left total knee revision, tibial and patellar components    Home Medications:  Medications Prior to Admission  Medication Sig Dispense Refill Last Dose   aspirin 325 MG tablet Take 650 mg by mouth every 6 (six) hours as needed for moderate pain.   04/18/2023   atenolol (TENORMIN) 25 MG tablet Take 25-50 mg by mouth See admin instructions. 50 mg in the morning, 25 mg in the evening   04/28/2023 at 0400   azithromycin (ZITHROMAX) 250 MG tablet Take 250 mg by mouth daily.   04/28/2023   BD DISP  NEEDLES 18G X 1-1/2" MISC       cloNIDine (CATAPRES) 0.1 MG tablet Take 0.1 mg by mouth 2 (two) times daily.   04/28/2023   diclofenac Sodium (VOLTAREN) 1 % GEL Apply 1 application topically 4 (four) times daily as needed (pain).   04/27/2023   EPINEPHrine (EPIPEN 2-PAK) 0.3 mg/0.3 mL SOAJ Inject 0.3 mLs (0.3 mg total) into the muscle once. 2 Device 5 not taken yet   esomeprazole (NEXIUM) 40 MG capsule take twice a day (Patient taking differently: Take 40 mg by mouth See admin instructions. Take 40 mg daily, may take a second 40 mg dose as needed for acid reflux) 60 capsule 11 04/28/2023   fluticasone (FLONASE) 50 MCG/ACT nasal spray USE 2 SPRAYS IN EACH NOSTRIL ONCE DAILY (Patient taking differently: Place 2 sprays into both nostrils daily as needed for allergies.) 16 g 1 04/28/2023   gentamicin cream (GARAMYCIN) 0.1 % Apply 1  application. topically 2 (two) times daily. 30 g 1 not used yet   lisinopril (ZESTRIL) 20 MG tablet Take 20 mg by mouth 2 (two) times daily.   04/27/2023   LORazepam (ATIVAN) 2 MG tablet Take 2 mg by mouth 3 (three) times daily.   04/28/2023   methocarbamol (ROBAXIN) 500 MG tablet Take 1 tablet (500 mg total) by mouth 4 (four) times daily. (Patient taking differently: Take 500 mg by mouth 3 (three) times daily as needed for muscle spasms.) 360 tablet 1 04/27/2023   naloxone (NARCAN) nasal spray 4 mg/0.1 mL Place 1 spray into the nose as needed (opioid overdose).   not taken yet   Naphazoline HCl (CLEAR EYES OP) Place 1 drop into both eyes daily as needed (redness).   04/27/2023   NEEDLE, DISP, 18 G (BD DISP NEEDLES) 18G X 1-1/2" MISC       Nutritional Supplements (JUICE PLUS FIBRE PO) Take 6 capsules by mouth daily. Take 2 Fruits + 2 Vegetables + 2 Berry   04/23/2023   Oxycodone HCl 10 MG TABS Take 1 tablet (10 mg total) by mouth every 4 (four) hours as needed (pain). 20 tablet 0 04/28/2023   tamsulosin (FLOMAX) 0.4 MG CAPS capsule Take 0.4 mg by mouth daily.   04/27/2023   testosterone cypionate (DEPOTESTOSTERONE CYPIONATE) 200 MG/ML injection Inject 100 mg into the muscle every Friday.   04/25/2023   celecoxib (CELEBREX) 200 MG capsule Take 1 capsule (200 mg total) by mouth 2 (two) times daily. (Patient taking differently: Take 200 mg by mouth daily as needed for moderate pain (pain score 4-6).) 180 capsule 3 More than a month   Allergies:  Allergies  Allergen Reactions   Bee Venom Anaphylaxis and Swelling   Lyrica [Pregabalin] Swelling    Arm swelling    Tramadol Itching    Family History  Problem Relation Age of Onset   Stroke Father    Anesthesia problems Neg Hx    Social History:  reports that he has never smoked. He has been exposed to tobacco smoke. He has never used smokeless tobacco. He reports current alcohol use of about 6.0 standard drinks of alcohol per week. He reports that he  does not use drugs.  ROS: A complete review of systems was performed.  All systems are negative except for pertinent findings as noted. Review of Systems  All other systems reviewed and are negative.    Physical Exam:  Vital signs in last 24 hours: Temp:  [98.2 F (36.8 C)] 98.2 F (36.8 C) (12/03 0547) Pulse  Rate:  [62] 62 (12/03 0547) Resp:  [18] 18 (12/03 0547) BP: (163-170)/(94-101) 163/94 (12/03 0551) SpO2:  [96 %] 96 % (12/03 0547) Weight:  [163.7 kg] 163.7 kg (12/03 4098) General:  Alert and oriented, No acute distress HEENT: Normocephalic, atraumatic Cardiovascular: Regular rate and rhythm Lungs: Regular rate and effort Abdomen: Soft, nontender, nondistended, no abdominal masses Back: No CVA tenderness Extremities: No edema Neurologic: Grossly intact  Laboratory Data:  No results found for this or any previous visit (from the past 24 hour(s)). No results found for this or any previous visit (from the past 240 hour(s)). Creatinine: No results for input(s): "CREATININE" in the last 168 hours.  Impression/Assessment:  BPH - LUTS -   Plan:  I discussed with the patient the nature, potential benefits, risks and alternatives to robotic water jet ablation of the prostate, including side effects of the proposed treatment, the likelihood of the patient achieving the goals of the procedure, and any potential problems that might occur during the procedure or recuperation.  Again discussed expectations for flow symptoms versus storage symptoms.  We discussed frequency urgency and nocturia can persist or worsen.  Also discussed postvoid dribble and double voiding sensations can continue.  Will plan to preserve ejaculation but discussed that this is not 100% guarantee.  All questions answered. Patient elects to proceed.   Jerilee Field 04/28/2023, 7:24 AM

## 2023-04-28 NOTE — Anesthesia Preprocedure Evaluation (Signed)
Anesthesia Evaluation  Patient identified by MRN, date of birth, ID band Patient awake    Reviewed: Allergy & Precautions, H&P , NPO status , Patient's Chart, lab work & pertinent test results  Airway Mallampati: III   Neck ROM: limited    Dental   Pulmonary sleep apnea and Continuous Positive Airway Pressure Ventilation    breath sounds clear to auscultation       Cardiovascular hypertension,  Rhythm:regular Rate:Normal     Neuro/Psych  PSYCHIATRIC DISORDERS Anxiety Depression       GI/Hepatic ,GERD  ,,  Endo/Other    Class 4 obesity  Renal/GU      Musculoskeletal  (+) Arthritis ,    Abdominal   Peds  Hematology   Anesthesia Other Findings   Reproductive/Obstetrics                             Anesthesia Physical Anesthesia Plan  ASA: 3  Anesthesia Plan: General   Post-op Pain Management:    Induction: Intravenous  PONV Risk Score and Plan: 2 and Ondansetron, Dexamethasone, Midazolam and Treatment may vary due to age or medical condition  Airway Management Planned: Oral ETT and Video Laryngoscope Planned  Additional Equipment:   Intra-op Plan:   Post-operative Plan: Extubation in OR  Informed Consent: I have reviewed the patients History and Physical, chart, labs and discussed the procedure including the risks, benefits and alternatives for the proposed anesthesia with the patient or authorized representative who has indicated his/her understanding and acceptance.     Dental advisory given  Plan Discussed with: CRNA, Anesthesiologist and Surgeon  Anesthesia Plan Comments:        Anesthesia Quick Evaluation

## 2023-04-30 NOTE — Anesthesia Postprocedure Evaluation (Signed)
Anesthesia Post Note  Patient: Raymond Herrera  Procedure(s) Performed: TRANSURETHRAL WATERJET ABLATION OF PROSTATE     Patient location during evaluation: PACU Anesthesia Type: General Level of consciousness: awake and alert Pain management: pain level controlled Vital Signs Assessment: post-procedure vital signs reviewed and stable Respiratory status: spontaneous breathing, nonlabored ventilation, respiratory function stable and patient connected to nasal cannula oxygen Cardiovascular status: blood pressure returned to baseline and stable Postop Assessment: no apparent nausea or vomiting Anesthetic complications: no   No notable events documented.  Last Vitals:  Vitals:   04/28/23 1230 04/28/23 1345  BP: (!) 155/94 (!) 148/90  Pulse: 70 68  Resp: 15 16  Temp: 36.8 C 36.7 C  SpO2: 96% 94%    Last Pain:  Vitals:   04/28/23 1345  TempSrc:   PainSc: 4                  Taishaun Levels S

## 2023-11-01 ENCOUNTER — Encounter (HOSPITAL_COMMUNITY): Payer: Self-pay | Admitting: *Deleted

## 2023-11-01 ENCOUNTER — Emergency Department (HOSPITAL_COMMUNITY)

## 2023-11-01 ENCOUNTER — Other Ambulatory Visit: Payer: Self-pay

## 2023-11-01 ENCOUNTER — Emergency Department (HOSPITAL_COMMUNITY)
Admission: EM | Admit: 2023-11-01 | Discharge: 2023-11-01 | Discharge status: Against Medical Advice | Attending: Emergency Medicine | Admitting: Emergency Medicine

## 2023-11-01 DIAGNOSIS — R079 Chest pain, unspecified: Secondary | ICD-10-CM | POA: Diagnosis present

## 2023-11-01 DIAGNOSIS — Z5321 Procedure and treatment not carried out due to patient leaving prior to being seen by health care provider: Secondary | ICD-10-CM | POA: Insufficient documentation

## 2023-11-01 DIAGNOSIS — R0602 Shortness of breath: Secondary | ICD-10-CM | POA: Diagnosis not present

## 2023-11-01 LAB — BASIC METABOLIC PANEL WITH GFR
Anion gap: 10 (ref 5–15)
BUN: 8 mg/dL (ref 8–23)
CO2: 22 mmol/L (ref 22–32)
Calcium: 8.4 mg/dL — ABNORMAL LOW (ref 8.9–10.3)
Chloride: 95 mmol/L — ABNORMAL LOW (ref 98–111)
Creatinine, Ser: 0.85 mg/dL (ref 0.61–1.24)
GFR, Estimated: >60 mL/min (ref >60)
Glucose, Bld: 92 mg/dL (ref 70–99)
Potassium: 3.7 mmol/L (ref 3.5–5.1)
Sodium: 127 mmol/L — ABNORMAL LOW (ref 135–145)

## 2023-11-01 LAB — CBC
HCT: 48.2 % (ref 39.0–52.0)
Hemoglobin: 16.8 g/dL (ref 13.0–17.0)
MCH: 30.8 pg (ref 26.0–34.0)
MCHC: 34.9 g/dL (ref 30.0–36.0)
MCV: 88.3 fL (ref 80.0–100.0)
Platelets: 172 10*3/uL (ref 150–400)
RBC: 5.46 MIL/uL (ref 4.22–5.81)
RDW: 14.2 % (ref 11.5–15.5)
WBC: 8.6 10*3/uL (ref 4.0–10.5)
nRBC: 0 % (ref 0.0–0.2)

## 2023-11-01 LAB — BRAIN NATRIURETIC PEPTIDE: B Natriuretic Peptide: 175.4 pg/mL — ABNORMAL HIGH (ref 0.0–100.0)

## 2023-11-01 LAB — TROPONIN I (HIGH SENSITIVITY): Troponin I (High Sensitivity): 6 ng/L (ref <18)

## 2023-11-01 NOTE — ED Notes (Signed)
 Pt ambulating in hallways, stating he cannot get comfortable d/t chronic back pain.  Does not appear to be in any severe distress.

## 2023-11-01 NOTE — ED Notes (Signed)
 Patient reports he no longer wanted to wait to see provider.  Spoke to [employer] and IV was removed.  Patient seen leaving department with family member.  Steady gait noted. Patient was alert and oriented prior to leaving

## 2023-11-01 NOTE — ED Triage Notes (Signed)
 Pt here via [facility] for increasing chest pain and sob x 3 days.  Pt states normally, when this pain comes, he takes his heart medications and the pain is relieved.  However, medications are not improving the pain this time.    VS stable per ems.  Pt has been off "water  pill" x 6 months.  Pt also states he feels he is getting more dependant on his pain medication.  He's needing it more and more.

## 2023-11-09 ENCOUNTER — Encounter: Payer: Self-pay | Admitting: Cardiology

## 2023-11-09 ENCOUNTER — Ambulatory Visit: Attending: Cardiology | Admitting: Cardiology

## 2023-11-09 VITALS — BP 128/74 | HR 76 | Ht 74.0 in | Wt 372.6 lb

## 2023-11-09 DIAGNOSIS — R0602 Shortness of breath: Secondary | ICD-10-CM | POA: Diagnosis not present

## 2023-11-09 DIAGNOSIS — I10 Essential (primary) hypertension: Secondary | ICD-10-CM

## 2023-11-09 DIAGNOSIS — R072 Precordial pain: Secondary | ICD-10-CM | POA: Diagnosis not present

## 2023-11-09 NOTE — Progress Notes (Signed)
 She did not bite Cardiology Office Note:  .   Date:  11/09/2023  ID:  Raymond Herrera, DOB November 13, 1955, MRN [id number] PCP: Chandler Combs, MD  [facility] Providers Cardiologist:  Fransico Ivy, MD PCP: Chandler Combs, MD  Chief Complaint  Patient presents with   Precordial pain     Raymond Herrera is a 68 y.o. male with hypertension, obesity, spinal stenosis, chest pain  Discussed the use of AI scribe software for clinical note transcription with the patient, who gave verbal consent to proceed.  History of Present Illness Raymond Herrera is a 68 year old male with hypertension who presents with chest discomfort.  He experiences chest discomfort for about four years, described as a sensation of pressure or squeezing, occurring even at rest. The discomfort can last for hours and occasionally improves with burping or after taking baking soda and water . Nexium  provides some relief.  Hypertension is managed with amlodipine, atenolol, and lisinopril. Clonidine and a diuretic were previously used but have been discontinued.  He has no family history of heart disease and quit smoking a long time ago. He does not experience shortness of breath but breathes more through his mouth during chest discomfort.      Vitals:   11/09/23 0906  BP: 128/74  Pulse: 76  SpO2: 98%      Review of Systems  Cardiovascular:  Positive for chest pain. Negative for dyspnea on exertion, leg swelling, palpitations and syncope.  Respiratory:  Positive for shortness of breath (Only when he has chest pain episodes).         Studies Reviewed: Raymond Herrera        EKG 11/01/2023: Sinus rhythm 78 bpm LAFB Abnormal EKG  Independently interpreted 11/05/2023: Chol 162, TG 109, HDL 37, LDL 105 Cr 0.83, Na 136 ProBNP 180 normal  10/2023: Hb 16.8 Cr 0.85, Na 127 BNP 175  CT cardiac scoring scan 05/2020: Calcium score 0   Physical Exam Vitals and nursing note reviewed.   Constitutional:      General: He is not in acute distress.    Appearance: He is obese.  Neck:     Vascular: No JVD.   Cardiovascular:     Rate and Rhythm: Normal rate and regular rhythm.     Heart sounds: Normal heart sounds. No murmur heard. Pulmonary:     Effort: Pulmonary effort is normal.     Breath sounds: Normal breath sounds. No wheezing or rales.   Musculoskeletal:     Right lower leg: No edema.     Left lower leg: No edema.      VISIT DIAGNOSES:   ICD-10-CM   1. Precordial pain  R07.2 ECHOCARDIOGRAM COMPLETE    2. Essential hypertension  I10 ECHOCARDIOGRAM COMPLETE    3. Shortness of breath  R06.02 ECHOCARDIOGRAM COMPLETE       Raymond Herrera is a 68 y.o. male with hypertension, obesity, spinal stenosis, chest pain Assessment & Plan Precordial pain: Nonexertional, improved with Nexium .  I suspect symptoms could be related to GERD.  He has previously had calcium score scan with 0 calcium in 2021, negative stress test in 2016.  Overall, my suspicion for angina is fairly low.  I have encouraged him to take Nexium  daily for next few weeks and see if symptoms improve.  If they do, I do not think we need any ischemia testing.  If they do not, I would consider PET/CT stress test, given that he may not be  able to walk on treadmill due to spinal stenosis, and Lexiscan nuclear stress test could be fraught with artifacts given his body habitus.  Shortness of breath: Seems to occur only during his episodes of precordial pain.  I do not thing this is related to myocarditis as patient seems to suspect, related to his COVID-vaccine 4 years ago.  BNP was elevated during ER visit, but subsequently, proBNP was normal.  I will obtain an echocardiogram to ensure structural normalcy.  Hypertension Hypertension managed with amlodipine, atenolol, and lisinopril. Clonidine and diuretics discontinued due to hyponatremia the past.     F/u in 4-6 weeks w/APP  Signed, Cody Das, MD

## 2023-11-09 NOTE — Patient Instructions (Signed)
  Testing/Procedures: ECHO  Your physician has requested that you have an echocardiogram. Echocardiography is a painless test that uses sound waves to create images of your heart. It provides your doctor with information about the size and shape of your heart and how well your heart's chambers and valves are working. This procedure takes approximately one hour. There are no restrictions for this procedure. Please do NOT wear cologne, perfume, aftershave, or lotions (deodorant is allowed). Please arrive 15 minutes prior to your appointment time.  Please note: We ask at that you not bring children with you during ultrasound (echo/ vascular) testing. Due to room size and safety concerns, children are not allowed in the ultrasound rooms during exams. Our front office staff cannot provide observation of children in our lobby area while testing is being conducted. An adult accompanying a patient to their appointment will only be allowed in the ultrasound room at the discretion of the ultrasound technician under special circumstances. We apologize for any inconvenience.   Follow-Up: At [facility], you and your health needs are our priority.  As part of our continuing mission to provide you with exceptional heart care, our providers are all part of one team.  This team includes your primary Cardiologist (physician) and Advanced Practice Providers or APPs (Physician Assistants and Nurse Practitioners) who all work together to provide you with the care you need, when you need it.  Your next appointment:   4 week(s)  Provider:   One of our Advanced Practice Providers (APPs): Melita Springer, PA-C  Friddie Jetty, NP Evaline Hill, NP  Theotis Flake, PA-C Lawana Pray, NP  Willis Harter, PA-C Lovette Rud, PA-C  [place], [place] Charan Connor, NP  Marlana Silvan, NP Marcie Sever, PA-C  Laquita Plant, PA-C    Dayna Dunn, PA-C  Marlyse Single, PA-C Palmer Bobo, NP Katlyn West, NP Callie Goodrich,  PA-C  Evan Williams, PA-C Sheng Haley, PA-C  Xika Zhao, NP Kathleen Johnson, PA-C

## 2023-11-16 NOTE — Progress Notes (Signed)
 Subjective:  Raymond Herrera is a 68 y.o. male here for evaluation of hyponatremia, polyuria. He had MRI pituitary this am. Has lost 12 1/2 lbs in past month. Report clear urine in large amounts. Has cut back on alcohol consumption. Eating a healthier diet.  He is interested in Tirzepatide for weight loss but unsure if insurance will cover. Has tried many diets. Is limited in exercise due to chronic back issues / surgery.  He was seen by cardiology on 6/16 for elevated BNP and they have ordered an Echo. He has lost 12 lbs in 1 1/2 month.   GAD, under good control. Has had a lot of family stress and loss. Feels stable right now.   Review of Systems - All other systems were reviewed and are negative unless stated in HPI.  Family History  Problem Relation Age of Onset  . Stroke Mother   . No Known Problems Father    Past Medical History:  Diagnosis Date  . Allergy   . Anxiety   . Arthritis   . GERD (gastroesophageal reflux disease)   . Hyperlipidemia   . Hypertension    Past Surgical History:  Procedure Laterality Date  . Back surgery    . Hand surgery Left   . Joint replacement    . Replacement total knee     Pediatric History  Patient Parents  . Not on file   Other Topics Concern  . Not on file  Social History Narrative  . Not on file    Objective:   BP 132/68 (BP Location: Left Upper Arm, Patient Position: Sitting)   Pulse 72   Temp 97.1 F (36.2 C) (Temporal)   Resp 20   Ht 6' 1 (1.854 m)   Wt (!) 371 lb 6.4 oz (168.5 kg)   SpO2 94%   BMI 49.00 kg/m  Gen: Alert, oriented, non toxic, and well hydrated.  No signs of acute distress. Head: Normocephalic.  Atraumatic.  Sclera anicteric. Eyes: Extraocular movements intact.  Conjunctiva clear.   Neck: Supple without LAD, thyroidmegaly, JVD, or bruits. Respiratory:  Lungs clear to auscultation.  No use of accessory muscles. Cardiovascular: Regular rate and rhythm.  No murmurs noted Abdominal:  Soft,  non tender, non distended.  No hepatosplenomegally Neuro: Cranial nerves intact grossly.  No loss of strength, sensation Extremities:  No cyanosis, clubbing, or edema.  Pedal pulses 2+ Skin:  No rashes noted.  No abnormal bleeding or bruising. Psych: Oriented, alert.  Mood and affect normal.  Assessment:   1. Hyponatremia  Comprehensive Metabolic Panel    2. Hyperglycemia  Comprehensive Metabolic Panel   tirzepatide (ZEPBOUND) 2.5 mg/0.5 mL injection    3. Morbid obesity with BMI of 45.0-49.9, adult (*)  tirzepatide (ZEPBOUND) 2.5 mg/0.5 mL injection      Plan:   Orders Placed This Encounter  Procedures  . Comprehensive Metabolic Panel    - I've reviewed pt's previous lab work and OV notes over the past 6 months to 1 year. - Will try to get zepbound for weight loss - He will stop by in a couple of weeks to have CMP drawn. - Awaiting results of MRI- - Continue with weight loss efforts. - Take medications as directed. - Dash diet education provided.  - Notify of any chest pain, SOB, lower extremity claudication or edema, eye pain, headache, or dizziness.  - Exercise, lose weight.  Continue healthy lifestyle modifications - Avoid excessive NSAIDS, ETOH, sodium, and smoking.  - Discussed  for pt to check blood pressure at home or stop by the office within the next 2 weeks - RTC in 3 months for re-evaluation - I discussed this diagnosis with the patient and discussed the treatment plan with them.  This treatment plan is also outlined in the Patient Instructions and a copy of this was provided to the patient.  - .Patient  verbalized to me that they understood what their problem is, what they need to do about it, and why it is important that they do it.  The patient/family voices understanding of all medications. No barriers to adherence were noted. Patient is taking all medications as prescribed and is tolerating well.  Plan for follow-up as discussed or as needed if any worsening  symptoms or change in condition.

## 2023-12-04 ENCOUNTER — Encounter (HOSPITAL_BASED_OUTPATIENT_CLINIC_OR_DEPARTMENT_OTHER): Payer: Self-pay

## 2023-12-07 ENCOUNTER — Ambulatory Visit (INDEPENDENT_AMBULATORY_CARE_PROVIDER_SITE_OTHER)

## 2023-12-07 DIAGNOSIS — R072 Precordial pain: Secondary | ICD-10-CM

## 2023-12-07 DIAGNOSIS — R0602 Shortness of breath: Secondary | ICD-10-CM | POA: Diagnosis not present

## 2023-12-07 DIAGNOSIS — I10 Essential (primary) hypertension: Secondary | ICD-10-CM

## 2023-12-07 LAB — ECHOCARDIOGRAM COMPLETE
Area-P 1/2: 2.76 cm2
S' Lateral: 2.67 cm

## 2023-12-08 ENCOUNTER — Ambulatory Visit: Admitting: Pulmonary Disease

## 2023-12-08 ENCOUNTER — Telehealth: Payer: Self-pay | Admitting: Cardiology

## 2023-12-08 VITALS — BP 113/72 | HR 73 | Ht 75.0 in | Wt 377.7 lb

## 2023-12-08 DIAGNOSIS — I5030 Unspecified diastolic (congestive) heart failure: Secondary | ICD-10-CM

## 2023-12-08 DIAGNOSIS — G4733 Obstructive sleep apnea (adult) (pediatric): Secondary | ICD-10-CM

## 2023-12-08 NOTE — Telephone Encounter (Signed)
 Heart pumping function is normal. No evidence of myocarditis. Moderate stiffening of the heart could be related to age, [age], blood pressure etc, which can cause shortness of breath. management includes controlling blood pressure and diuretics, if needed.. Patent connection between two top chambers of the heart, which is a present in 1 out of 4 individuals, should not be any cause for concern unless prior history of stroke. Overall, no restritction to physical activity. If activity limied by shortness of breath, I would recommend considerting further medications, starting with lasix 20 mg daily. We can other medications in future, if shortness of breath does not improve. That said, from what I recollect, shortness of breath would only occur during episdoes of chest pain. No clear etiolgoy noted from heart standpoint to explain chest pain.   Thanks MJP

## 2023-12-08 NOTE — Telephone Encounter (Signed)
 Pt called asking for echo results, test done yesterday. He asked that we get feedback from Dr. Fernande because Dr. Elmira is out, however, I see a schedule for Dr. SHAUNNA tomorrow and I am showing Fernande out. Pt wants feedback on echo please and wants to know if he has any physical restrictions for every day activities such as walking outside, swimming, etc. Copying Dr. Fernande, as pt wants feedback asap.

## 2023-12-08 NOTE — Telephone Encounter (Signed)
Patient wants a call back to discuss echocardiogram test results.

## 2023-12-08 NOTE — Telephone Encounter (Signed)
 Left msg for pt to call back

## 2023-12-08 NOTE — Patient Instructions (Addendum)
   Continue using your CPAP on a nightly basis - The compliance that we looked at on your phone shows that the machine is working effectively -No changes need to be made  Regarding your recent echocardiogram  - Discussing with the heart doctor will be the way forward -With concerns of pulmonary hypertension, right heart catheterization can be considered  - SGLT2 inhibitors should be considered - Medications for weight loss should be considered   Continue graded exercises  Follow-up in about 3 months  Call with significant concerns

## 2023-12-08 NOTE — Telephone Encounter (Signed)
 FYI only, pt not asking for further direction at this time.   Spoke to pt, relayed provider feedback as stated. Pt states has excessive urination at night and is currently running tests and following closely with PCP and urologist regarding this. He had some low Na+, now drinking daily liquid IV to keep this up into normal range. He has most recent lab results in [facility]where with [facility] for you review if needed. Pt does not want to start diuretic at this time, since urology had him stop his diuretic when the low Na+ first started, he is afraid it will start to drop low again. He will start activities and let us  know of any worsening SOB or other symptoms,  has f/u on 12/23/23.

## 2023-12-08 NOTE — Progress Notes (Signed)
 Raymond Herrera    [id number]    May 11, 1956  Primary Care Physician:Corrington, Kip A, MD  Referring Physician: Bobbette Coye LABOR, MD [phone] [place],  [place] [address]  Chief complaint:   Patient with a history of obstructive sleep apnea  HPI: Has been doing well since his last visit, was last here about a year ago  CPAP pressures have been decreased  Recently had an echocardiogram that was abnormal showing diastolic heart dysfunction with dilatation of the right side of his heart  Denies any significant ongoing issues with his CPAP, uses it nightly and continues to benefit from it  Compliance data shows excellent compliance with adequate control of symptoms  History of chronic back pain  Sleeping well Good energy levels No significant change in his health since the last visit  Last sleep study was in 2011 In 2012 was started on an auto titrating setting, use the machine for about 4 to 6 weeks to try and determine what pressure is required, machine is currently set on a fixed pressure of 15  Usually goes to bed about 10 PM, about 1 awakening, final wake up time 8 AM  Experiences dryness No headache  Weight has remained stable  Outpatient Encounter Medications as of 12/08/2023  Medication Sig   amLODipine (NORVASC) 2.5 MG tablet Take 2.5 mg by mouth 2 (two) times daily.   amphetamine -dextroamphetamine  (ADDERALL) 10 MG tablet Take 10 mg by mouth.   aspirin  325 MG tablet Take 2 tablets (650 mg total) by mouth every 6 (six) hours as needed for moderate pain (pain score 4-6).   atenolol (TENORMIN) 25 MG tablet Take 25-50 mg by mouth See admin instructions. 50 mg in the morning, 25 mg in the evening   azithromycin (ZITHROMAX) 250 MG tablet Take 250 mg by mouth daily.   BD DISP NEEDLES 18G X 1-1/2 MISC    celecoxib  (CELEBREX ) 200 MG capsule Take 1 capsule (200 mg total) by mouth 2 (two) times daily.   cephALEXin  (KEFLEX ) 500 MG capsule Take 1  capsule (500 mg total) by mouth at bedtime.   clobetasol ointment (TEMOVATE) 0.05 % Apply to the affected area of psoriasis rash on the back twice daily as needed during flares.   diclofenac Sodium (VOLTAREN) 1 % GEL Apply 1 application topically 4 (four) times daily as needed (pain).   EPINEPHrine  (EPIPEN  2-PAK) 0.3 mg/0.3 mL SOAJ Inject 0.3 mLs (0.3 mg total) into the muscle once.   esomeprazole  (NEXIUM ) 40 MG capsule take twice a day   FLUoxetine (PROZAC) 10 MG capsule Take 10 mg by mouth.   fluticasone  (FLONASE ) 50 MCG/ACT nasal spray USE 2 SPRAYS IN EACH NOSTRIL ONCE DAILY   gentamicin  cream (GARAMYCIN ) 0.1 % Apply 1 application. topically 2 (two) times daily.   lisinopril (ZESTRIL) 20 MG tablet Take 20 mg by mouth 2 (two) times daily.   LORazepam  (ATIVAN ) 2 MG tablet Take 2 mg by mouth 3 (three) times daily.   methocarbamol  (ROBAXIN ) 500 MG tablet Take 1 tablet (500 mg total) by mouth 4 (four) times daily.   naloxone (NARCAN) nasal spray 4 mg/0.1 mL Place 1 spray into the nose as needed (opioid overdose).   Naphazoline HCl (CLEAR EYES OP) Place 1 drop into both eyes daily as needed (redness).   NEEDLE, DISP, 18 G (BD DISP NEEDLES) 18G X 1-1/2 MISC    Nutritional Supplements (JUICE PLUS FIBRE PO) Take 6 capsules by mouth daily.  Take 2 Fruits + 2 Vegetables + 2 Berry   Oxycodone  HCl 10 MG TABS Take 1 tablet (10 mg total) by mouth every 4 (four) hours as needed (pain).   testosterone  cypionate (DEPOTESTOSTERONE CYPIONATE) 200 MG/ML injection Inject 100 mg into the muscle every Friday.   oxybutynin  (DITROPAN ) 5 MG tablet Take 1 tablet (5 mg total) by mouth every 8 (eight) hours as needed for bladder spasms. (Patient not taking: Reported on 12/08/2023)   tamsulosin (FLOMAX) 0.4 MG CAPS capsule Take 0.4 mg by mouth daily. (Patient not taking: Reported on 12/08/2023)   No facility-administered encounter medications on file as of 12/08/2023.    Allergies as of 12/08/2023 - Review Complete  12/08/2023  Allergen Reaction Noted   Bee venom Anaphylaxis and Swelling 10/01/2011   Lyrica [pregabalin] Swelling 04/24/2015   Tramadol Itching 07/20/2015    Past Medical History:  Diagnosis Date   Allergy    Anxiety    Arthritis    Both Knees   Chronic low back pain    had seen Dr. Royden Schneider, the Workers Comp case has been settled   Depression    Family history of adverse reaction to anesthesia    sister ponv   GERD (gastroesophageal reflux disease)    Hyperlipidemia    Hypertension    Hypogonadism male    sees Dr. Alline    Plantar fasciitis    hx of   Pre-diabetes    Scrotal abscess 06/06/2010   MRSA, lanced by Dr. Alline    Sleep apnea, obstructive    sees Dr. Corrie, uses CPAP  setting of 12    Past Surgical History:  Procedure Laterality Date   ANKLE ARTHROSCOPY Left 07/05/2021   Procedure: LEFT ANKLE ARTHROSCOPY, DEBRIDEMENT OSTEOCHONDRAL DEFECT, partial synovectomy;  Surgeon: Barbarann Oneil BROCKS, MD;  Location: [facility] OR;  Service: Orthopedics;  Laterality: Left;   APPENDECTOMY     BACK SURGERY  2016   Laminectomy   CARPAL TUNNEL RELEASE Left 02/14/2014   per Dr. Rolan Chancy    COLONOSCOPY  2008   clear, repeat in 10 yrs    COLONOSCOPY WITH PROPOFOL  N/A 11/06/2016   Procedure: COLONOSCOPY WITH PROPOFOL ;  Surgeon: Teressa Toribio SQUIBB, MD;  Location: [facility];  Service: Endoscopy;  Laterality: N/A;   JOINT REPLACEMENT  20060,2007   bilateral knees, Dr. Chancy    KNEE ARTHROSCOPY Bilateral    RADIOFREQUENCY ABLATION  02/2011   x 4   Scrotal Abcess     I/D - healed   TONSILLECTOMY     TOTAL KNEE REVISION  10/01/2011   Procedure: TOTAL KNEE REVISION;  Surgeon: Toribio JULIANNA Chancy, MD;  Location: [facility] OR;  Service: Orthopedics;  Laterality: Left;  left total knee revision, tibial and patellar components    Family History  Problem Relation Age of Onset   Stroke Father    Anesthesia problems Neg Hx     Social History   Socioeconomic History   Marital status: Married     Spouse name: Not on file   Number of children: Not on file   Years of education: Not on file   Highest education level: Not on file  Occupational History   Not on file  Tobacco Use   Smoking status: Never    Passive exposure: Current   Smokeless tobacco: Never  Vaping Use   Vaping status: Never Used  Substance and Sexual Activity   Alcohol use: Yes    Alcohol/week: 6.0 standard drinks of alcohol  Types: 6 Cans of beer per week    Comment: drinks 4 12 oz beers per day   Drug use: No   Sexual activity: Not on file  Other Topics Concern   Not on file  Social History Narrative   Not on file   Social Drivers of Health   Financial Resource Strain: Low Risk  (06/09/2023)   Received from [facility]   Overall Financial Resource Strain (CARDIA)    Difficulty of Paying Living Expenses: Not hard at all  Food Insecurity: No Food Insecurity (06/09/2023)   Received from [facility]   Hunger Vital Sign    Within the past 12 months, you worried that your food would run out before you got the money to buy more.: Never true    Within the past 12 months, the food you bought just didn't last and you didn't have money to get more.: Never true  Transportation Needs: No Transportation Needs (06/09/2023)   Received from [facility] - Transportation    Lack of Transportation (Medical): No    Lack of Transportation (Non-Medical): No  Physical Activity: Unknown (05/12/2023)   Received from [facility]   Exercise Vital Sign    On average, how many days per week do you engage in moderate to strenuous exercise (like a brisk walk)?: 0 days    Minutes of Exercise per Session: Not on file  Stress: No Stress Concern Present (05/12/2023)   Received from [facility] - Occupational Stress Questionnaire    Feeling of Stress : Only a little  Social Connections: Moderately Integrated (05/12/2023)   Received from [facility]   Social  Network    How would you rate your social network (family, work, friends)?: Adequate participation with social networks  Intimate Partner Violence: Not At Risk (05/12/2023)   Received from [facility]   HITS    Over the last 12 months how often did your partner physically hurt you?: Never    Over the last 12 months how often did your partner insult you or talk down to you?: Never    Over the last 12 months how often did your partner threaten you with physical harm?: Never    Over the last 12 months how often did your partner scream or curse at you?: Never    Review of Systems  Constitutional:  Positive for activity change.  Respiratory:  Positive for apnea.   Psychiatric/Behavioral:  Positive for sleep disturbance.     Vitals:   12/08/23 0913  BP: 113/72  Pulse: 73  SpO2: 94%     Physical Exam Constitutional:      Appearance: He is obese.  HENT:     Head: Normocephalic.     Mouth/Throat:     Mouth: Mucous membranes are moist.     Comments: Mallampati 4, crowded oropharynx Eyes:     General: No scleral icterus.    Pupils: Pupils are equal, round, and reactive to light.  Neck:     Comments: Neck is thick Cardiovascular:     Rate and Rhythm: Normal rate.     Pulses: Normal pulses.     Heart sounds: Normal heart sounds. No murmur heard.    No friction rub.  Pulmonary:     Effort: Pulmonary effort is normal. No respiratory distress.     Breath sounds: No stridor. No wheezing or rhonchi.  Musculoskeletal:     Cervical back: No rigidity or tenderness.  Skin:    General: Skin is warm.  Neurological:     General: No focal deficit present.     Mental Status: He is alert.  Psychiatric:        Mood and Affect: Mood normal.    Data Reviewed: Patient remains on CPAP of 13  100% compliance, AHI of 0.3 Assessment:   Severe obstructive sleep apnea On a CPAP of 15 Tolerating it well  Continues to benefit from CPAP therapy - No changes need made  Recent abnormal  echocardiogram with with diastolic heart failure, severely enlarged right ventricular size, dilated left atrium concerns for pulmonary hypertension - Right heart catheterization can be considered  May benefit from SGLT2 inhibitors  -Has appointment to follow-up with cardiology to optimize treatment of heart failure  Obesity  Plan/Recommendations:  Continue CPAP nightly  Importance of continuing CPAP nightly discussed  He has a follow-up appoint with cardiology  I will like to see him back in about 3 months  Encourage graded exercises as tolerated  Encouraged to call with significant concerns  Jennet Epley MD [facility] 12/08/2023, 9:30 AM  CC: Corrington, Kip A, MD

## 2023-12-21 NOTE — Progress Notes (Unsigned)
 Cardiology Office Note   Date:  12/23/2023  ID:  Raymond Herrera, DOB 29-Aug-1955, MRN [id number] PCP: Bobbette Coye LABOR, MD  [facility] Providers Cardiologist:  None    History of Present Illness Raymond Herrera is a 68 y.o. male with a past medical history significant for hypertension and previous chest discomfort here for follow-up appointment.  Was recently seen in the office by Dr. Elmira.  He was experiencing chest discomfort for about 4 years and describes this as a sensation of pressure and squeezing.  Event occurred at rest.  The discomfort can last for hours and occasionally improves with burping or taking baking soda and water .  Nexium  provides some relief.  Hypertension is managed with amlodipine , atenolol  and lisinopril .  Clonidine and his diuretic were previously used but have been discontinued.  No family history of heart disease and he quit smoking a long time ago.  Does not experience shortness of breath but breathes more through his mouth during chest discomfort.  Overall, suspicion for angina was low.  Echocardiogram was ordered and is listed below.  Today, he presents with diastolic dysfunction and right ventricular enlargement  for cardiovascular evaluation.  He experiences increased fatigue and dyspnea on exertion during activities like walking to retrieve the trash can. He denies chest pain but has mild dependent edema, indicated by a sock line that resolves quickly.  He has grade two diastolic dysfunction with normal heart pump function but impaired relaxation. The left atrium is slightly enlarged, and the aorta measures 39 millimeters. The right ventricle is severely enlarged, but the tricuspid valve is normal. He denies right-sided heart failure symptoms such as increased shortness of breath or leg swelling.  He takes amlodipine  5 mg once daily, lisinopril , and atenolol  25 mg twice daily. He occasionally uses a small dose of Adderall for  alertness during long drives. He maintains electrolyte balance with a daily liquid IV after stabilizing hyponatremia by discontinuing a diuretic.  A recent echocardiogram detected a small patent foramen ovale with left-to-right shunting. His sister has a history of stroke due to a PFO.  He engages in water  exercises and plans to resume biking. He uses a CPAP machine for sleep apnea, which has been effective, with minimal events recorded over the past three years. He monitors his cholesterol annually, noting a consistently slightly elevated LDL level.  Reports no chest pain, pressure, or tightness. No edema, orthopnea, PND. Reports no palpitations.   Discussed the use of AI scribe software for clinical note transcription with the patient, who gave verbal consent to proceed.  ROS: pertinent ROS in HPI  Studies Reviewed     Echocardiogram 12/07/2023 IMPRESSIONS     1. Left ventricular ejection fraction, by estimation, is 55 to 60%. Left  ventricular ejection fraction by 3D volume is 55 %. The left ventricle has  normal function. The left ventricle has no regional wall motion  abnormalities. The left ventricular  internal cavity size was mildly dilated. Left ventricular diastolic  parameters are consistent with Grade II diastolic dysfunction  (pseudonormalization). The average left ventricular global longitudinal  strain is -15.2 %. The global longitudinal strain is   abnormal.   2. Right ventricular systolic function is normal. The right ventricular  size is severely enlarged.   3. Left atrial size was mildly dilated.   4. The mitral valve is normal in structure. No evidence of mitral valve  regurgitation. No evidence of mitral stenosis.   5. The aortic valve is normal in  structure. Aortic valve regurgitation is  not visualized. No aortic stenosis is present.   6. Aortic dilatation noted. There is mild dilatation of the ascending  aorta, measuring 39 mm.   7. The inferior vena cava is  normal in size with greater than 50%  respiratory variability, suggesting right atrial pressure of 3 mmHg.   8. Evidence of atrial level shunting detected by color flow Doppler.  There is a small patent foramen ovale with predominantly left to right  shunting across the atrial septum.   FINDINGS   Left Ventricle: Left ventricular ejection fraction, by estimation, is 55  to 60%. Left ventricular ejection fraction by 3D volume is 55 %. The left  ventricle has normal function. The left ventricle has no regional wall  motion abnormalities. The average  left ventricular global longitudinal strain is -15.2 %. Strain was  performed and the global longitudinal strain is abnormal. The left  ventricular internal cavity size was mildly dilated. There is no left  ventricular hypertrophy. Left ventricular  diastolic parameters are consistent with Grade II diastolic dysfunction  (pseudonormalization). Normal left ventricular filling pressure.   Right Ventricle: The right ventricular size is severely enlarged. No  increase in right ventricular wall thickness. Right ventricular systolic  function is normal.   Left Atrium: Left atrial size was mildly dilated.   Right Atrium: Right atrial size was normal in size.   Pericardium: There is no evidence of pericardial effusion.   Mitral Valve: The mitral valve is normal in structure. No evidence of  mitral valve regurgitation. No evidence of mitral valve stenosis.   Tricuspid Valve: The tricuspid valve is normal in structure. Tricuspid  valve regurgitation is trivial. No evidence of tricuspid stenosis.   Aortic Valve: The aortic valve is normal in structure. Aortic valve  regurgitation is not visualized. No aortic stenosis is present.   Pulmonic Valve: The pulmonic valve was normal in structure. Pulmonic valve  regurgitation is not visualized. No evidence of pulmonic stenosis.   Aorta: Aortic dilatation noted. There is mild dilatation of the  ascending  aorta, measuring 39 mm.   Venous: The inferior vena cava is normal in size with greater than 50%  respiratory variability, suggesting right atrial pressure of 3 mmHg.   IAS/Shunts: Evidence of atrial level shunting detected by color flow  Doppler. A small patent foramen ovale is detected with predominantly left  to right shunting across the atrial septum.   Additional Comments: 3D was performed not requiring image post processing  on an independent workstation and was normal.   Physical Exam VS:  BP 131/80 (BP Location: Left Arm, Patient Position: Sitting, Cuff Size: Large)   Pulse 66   Resp 14   Ht 6' 3 (1.905 m)   Wt (!) 380 lb 6.4 oz (172.5 kg)   SpO2 94%   BMI 47.55 kg/m        Wt Readings from Last 3 Encounters:  12/23/23 (!) 380 lb 6.4 oz (172.5 kg)  12/08/23 (!) 377 lb 11.8 oz (171.3 kg)  11/09/23 (!) 372 lb 9.6 oz (169 kg)    GEN: Well nourished, well developed in no acute distress NECK: No JVD; No carotid bruits CARDIAC: RRR, no murmurs, rubs, gallops RESPIRATORY:  Clear to auscultation without rales, wheezing or rhonchi  ABDOMEN: Soft, non-tender, non-distended EXTREMITIES:  No edema; No deformity   ASSESSMENT AND PLAN  Grade 2 diastolic dysfunction with right ventricular and left atrial enlargement/SOB Echocardiogram shows grade 2 diastolic dysfunction with impaired relaxation.  Left atrium slightly enlarged, right ventricle severely enlarged. Tricuspid valve normal, no right-sided heart failure symptoms. Increased dyspnea with minimal exertion noted. - Encourage regular physical activity, such as using a recumbent bike. - Monitor for symptoms of right-sided heart failure, such as increased dyspnea and leg swelling.  Mild aortic root enlargement Aortic root mildly enlarged at 39 mm, potentially normal for body size. No immediate concern.  Small patent foramen ovale (PFO) Small PFO with left-to-right shunting. Family history of stroke related to  PFO. Aware of potential risks, including stroke.  Essential hypertension Blood pressure well-controlled on amlodipine , lisinopril , and atenolol . Current regimen effective. - Continue current antihypertensive regimen with amlodipine , lisinopril , and atenolol . - Monitor blood pressure regularly.  Lower extremity dependent edema Mild dependent edema likely due to gravity, not cardiac in origin. Sock line resolves quickly, indicating mild fluid retention. - Educate on signs of significant edema, such as persistent sock lines or pitting edema.  History of hyponatremia Sodium levels stable. Adequate hydration crucial to prevent recurrence. - Continue monitoring sodium levels periodically. - Encourage adequate hydration, especially during hot weather.      Dispo: He can follow-up in 6 months  Signed, Orren LOISE Fabry, PA-C

## 2023-12-23 ENCOUNTER — Other Ambulatory Visit (HOSPITAL_COMMUNITY)

## 2023-12-23 ENCOUNTER — Encounter: Payer: Self-pay | Admitting: Physician Assistant

## 2023-12-23 ENCOUNTER — Ambulatory Visit: Attending: Cardiology | Admitting: Physician Assistant

## 2023-12-23 VITALS — BP 131/80 | HR 66 | Resp 14 | Ht 75.0 in | Wt 380.4 lb

## 2023-12-23 DIAGNOSIS — R0602 Shortness of breath: Secondary | ICD-10-CM | POA: Diagnosis not present

## 2023-12-23 DIAGNOSIS — R072 Precordial pain: Secondary | ICD-10-CM | POA: Diagnosis not present

## 2023-12-23 DIAGNOSIS — I10 Essential (primary) hypertension: Secondary | ICD-10-CM | POA: Diagnosis not present

## 2023-12-23 MED ORDER — LISINOPRIL 20 MG PO TABS: 20.0000 mg | ORAL_TABLET | Freq: Two times a day (BID) | ORAL | 2 refills | Status: AC

## 2023-12-23 MED ORDER — ATENOLOL 25 MG PO TABS: 25.0000 mg | ORAL_TABLET | ORAL | 2 refills | Status: AC

## 2023-12-23 MED ORDER — AMLODIPINE BESYLATE 2.5 MG PO TABS: 2.5000 mg | ORAL_TABLET | Freq: Two times a day (BID) | ORAL | 3 refills | Status: AC

## 2023-12-23 NOTE — Patient Instructions (Signed)
 Medication Instructions:  NO CHANGES *If you need a refill on your cardiac medications before your next appointment, please call your pharmacy*  Lab Work: NO LABS If you have labs (blood work) drawn today and your tests are completely normal, you will receive your results only by: MyChart Message (if you have MyChart) OR A paper copy in the mail If you have any lab test that is abnormal or we need to change your treatment, we will call you to review the results.  Testing/Procedures: NO TESTING  Follow-Up: At [facility], you and your health needs are our priority.  As part of our continuing mission to provide you with exceptional heart care, our providers are all part of one team.  This team includes your primary Cardiologist (physician) and Advanced Practice Providers or APPs (Physician Assistants and Nurse Practitioners) who all work together to provide you with the care you need, when you need it.  Your next appointment:   6 month(s)  Provider:   Newman Lawrence, MD or Orren Fabry, PA-C

## 2024-02-22 NOTE — Telephone Encounter (Signed)
 error

## 2024-03-09 ENCOUNTER — Ambulatory Visit: Admitting: Pulmonary Disease

## 2024-04-19 ENCOUNTER — Encounter: Payer: Self-pay | Admitting: Cardiology

## 2024-05-17 ENCOUNTER — Encounter: Payer: Self-pay | Admitting: Pulmonary Disease

## 2024-05-17 ENCOUNTER — Ambulatory Visit: Admitting: Pulmonary Disease

## 2024-05-17 VITALS — BP 146/81 | HR 68 | Ht 75.0 in | Wt 390.0 lb

## 2024-05-17 DIAGNOSIS — Z9989 Dependence on other enabling machines and devices: Secondary | ICD-10-CM | POA: Diagnosis not present

## 2024-05-17 DIAGNOSIS — E66813 Obesity, class 3: Secondary | ICD-10-CM | POA: Diagnosis not present

## 2024-05-17 DIAGNOSIS — G4733 Obstructive sleep apnea (adult) (pediatric): Secondary | ICD-10-CM | POA: Diagnosis not present

## 2024-05-17 DIAGNOSIS — I119 Hypertensive heart disease without heart failure: Secondary | ICD-10-CM

## 2024-05-17 DIAGNOSIS — Z6841 Body Mass Index (BMI) 40.0 and over, adult: Secondary | ICD-10-CM

## 2024-05-17 NOTE — Patient Instructions (Signed)
 We will keep your appointment at 6 months  Continue using CPAP on a nightly basis  Continue graded exercises as tolerated for weight loss  Call us  with significant concerns

## 2024-05-17 NOTE — Progress Notes (Signed)
 Raymond Herrera    [id number]    1956/01/17  Primary Care Physician:Corrington, Kip A, MD  Referring Physician: Bobbette Coye LABOR, MD [phone] [place],  [place] [address]  Chief complaint:   Patient with a history of obstructive sleep apnea  HPI: Doing well and tolerating CPAP since her last visit about 6 months ago  Followed up with cardiology about dilatation of his right side of his heart - Will continue follow-up  Sleeping well, functioning well Still wakes up in the morning feeling a little tired  Compliant with CPAP Not having significant issues with the machine or the mask  He has a history of chronic back pain  Sleeping well Good energy levels No significant change in his health since the last visit  Last sleep study was in 2011 In 2012 was started on an auto titrating setting, use the machine for about 4 to 6 weeks to try and determine what pressure is required, machine is currently set on a fixed pressure of 15  Usually goes to bed about 10 PM, about 1 awakening, final wake up time 8 AM  Experiences dryness No headache  Weight has remained stable  Outpatient Encounter Medications as of 05/17/2024  Medication Sig   amLODipine  (NORVASC ) 2.5 MG tablet Take 1 tablet (2.5 mg total) by mouth 2 (two) times daily. (Patient taking differently: Take 2.5 mg by mouth 2 (two) times daily. Patient is now on 5 mg once daily but prefers to cut tablets in 1/2 and take twice a day)   amphetamine -dextroamphetamine  (ADDERALL) 10 MG tablet Take 10 mg by mouth.   aspirin  325 MG tablet Take 2 tablets (650 mg total) by mouth every 6 (six) hours as needed for moderate pain (pain score 4-6).   atenolol  (TENORMIN ) 25 MG tablet Take 1-2 tablets (25-50 mg total) by mouth See admin instructions. 50 mg in the morning, 25 mg in the evening   azithromycin (ZITHROMAX) 250 MG tablet Take 250 mg by mouth daily.   BD DISP NEEDLES 18G X 1-1/2 MISC    celecoxib   (CELEBREX ) 200 MG capsule Take 1 capsule (200 mg total) by mouth 2 (two) times daily.   cephALEXin  (KEFLEX ) 500 MG capsule Take 1 capsule (500 mg total) by mouth at bedtime.   clobetasol ointment (TEMOVATE) 0.05 % Apply to the affected area of psoriasis rash on the back twice daily as needed during flares.   diclofenac Sodium (VOLTAREN) 1 % GEL Apply 1 application topically 4 (four) times daily as needed (pain).   EPINEPHrine  (EPIPEN  2-PAK) 0.3 mg/0.3 mL SOAJ Inject 0.3 mLs (0.3 mg total) into the muscle once.   esomeprazole  (NEXIUM ) 40 MG capsule take twice a day   FLUoxetine (PROZAC) 10 MG capsule Take 10 mg by mouth.   fluticasone  (FLONASE ) 50 MCG/ACT nasal spray USE 2 SPRAYS IN EACH NOSTRIL ONCE DAILY   gentamicin  cream (GARAMYCIN ) 0.1 % Apply 1 application. topically 2 (two) times daily.   lisinopril  (ZESTRIL ) 20 MG tablet Take 1 tablet (20 mg total) by mouth 2 (two) times daily.   LORazepam  (ATIVAN ) 2 MG tablet Take 2 mg by mouth 3 (three) times daily.   methocarbamol  (ROBAXIN ) 500 MG tablet Take 1 tablet (500 mg total) by mouth 4 (four) times daily.   naloxone (NARCAN) nasal spray 4 mg/0.1 mL Place 1 spray into the nose as needed (opioid overdose).   Naphazoline HCl (CLEAR  EYES OP) Place 1 drop into both eyes daily as needed (redness).   NEEDLE, DISP, 18 G (BD DISP NEEDLES) 18G X 1-1/2 MISC    Nutritional Supplements (JUICE PLUS FIBRE PO) Take 6 capsules by mouth daily. Take 2 Fruits + 2 Vegetables + 2 Berry   Oxycodone  HCl 10 MG TABS Take 1 tablet (10 mg total) by mouth every 4 (four) hours as needed (pain).   testosterone  cypionate (DEPOTESTOSTERONE CYPIONATE) 200 MG/ML injection Inject 100 mg into the muscle every Friday.   No facility-administered encounter medications on file as of 05/17/2024.    Allergies as of 05/17/2024 - Review Complete 05/17/2024  Allergen Reaction Noted   Bee venom Anaphylaxis and Swelling 10/01/2011   Lyrica [pregabalin] Swelling 04/24/2015   Tramadol  Itching 07/20/2015    Past Medical History:  Diagnosis Date   Allergy    Anxiety    Arthritis    Both Knees   Chronic low back pain    had seen Dr. Royden Schneider, the Workers Comp case has been settled   Depression    Family history of adverse reaction to anesthesia    sister ponv   GERD (gastroesophageal reflux disease)    Hyperlipidemia    Hypertension    Hypogonadism male    sees Dr. Alline    Plantar fasciitis    hx of   Pre-diabetes    Scrotal abscess 06/06/2010   MRSA, lanced by Dr. Alline    Sleep apnea, obstructive    sees Dr. Corrie, uses CPAP  setting of 12    Past Surgical History:  Procedure Laterality Date   ANKLE ARTHROSCOPY Left 07/05/2021   Procedure: LEFT ANKLE ARTHROSCOPY, DEBRIDEMENT OSTEOCHONDRAL DEFECT, partial synovectomy;  Surgeon: Barbarann Oneil BROCKS, MD;  Location: [facility] OR;  Service: Orthopedics;  Laterality: Left;   APPENDECTOMY     BACK SURGERY  2016   Laminectomy   CARPAL TUNNEL RELEASE Left 02/14/2014   per Dr. Rolan Chancy    COLONOSCOPY  2008   clear, repeat in 10 yrs    COLONOSCOPY WITH PROPOFOL  N/A 11/06/2016   Procedure: COLONOSCOPY WITH PROPOFOL ;  Surgeon: Teressa Toribio SQUIBB, MD;  Location: [facility];  Service: Endoscopy;  Laterality: N/A;   JOINT REPLACEMENT  20060,2007   bilateral knees, Dr. Chancy    KNEE ARTHROSCOPY Bilateral    RADIOFREQUENCY ABLATION  02/2011   x 4   Scrotal Abcess     I/D - healed   TONSILLECTOMY     TOTAL KNEE REVISION  10/01/2011   Procedure: TOTAL KNEE REVISION;  Surgeon: Toribio JULIANNA Chancy, MD;  Location: [facility] OR;  Service: Orthopedics;  Laterality: Left;  left total knee revision, tibial and patellar components    Family History  Problem Relation Age of Onset   Stroke Father    Anesthesia problems Neg Hx     Social History   Socioeconomic History   Marital status: Married    Spouse name: Not on file   Number of children: Not on file   Years of education: Not on file   Highest education level: Not on file   Occupational History   Not on file  Tobacco Use   Smoking status: Never    Passive exposure: Current   Smokeless tobacco: Never  Vaping Use   Vaping status: Never Used  Substance and Sexual Activity   Alcohol use: Yes    Alcohol/week: 6.0 standard drinks of alcohol    Types: 6 Cans of beer per week  Comment: drinks 4 12 oz beers per day   Drug use: No   Sexual activity: Not on file  Other Topics Concern   Not on file  Social History Narrative   Not on file   Social Drivers of Health   Tobacco Use: Medium Risk (05/17/2024)   Patient History    Smoking Tobacco Use: Never    Smokeless Tobacco Use: Never    Passive Exposure: Current  Financial Resource Strain: Low Risk (06/09/2023)   Received from [facility]   Overall Financial Resource Strain (CARDIA)    Difficulty of Paying Living Expenses: Not hard at all  Food Insecurity: No Food Insecurity (06/09/2023)   Received from [facility]   Epic    Within the past 12 months, you worried that your food would run out before you got the money to buy more.: Never true    Within the past 12 months, the food you bought just didn't last and you didn't have money to get more.: Never true  Transportation Needs: No Transportation Needs (06/09/2023)   Received from [facility] - Transportation    Lack of Transportation (Medical): No    Lack of Transportation (Non-Medical): No  Physical Activity: Unknown (05/12/2023)   Received from [facility]   Exercise Vital Sign    On average, how many days per week do you engage in moderate to strenuous exercise (like a brisk walk)?: 0 days    Minutes of Exercise per Session: Not on file  Stress: No Stress Concern Present (05/12/2023)   Received from [facility] of Occupational [facility] - Occupational Stress Questionnaire    Feeling of Stress : Only a little  Social Connections: Moderately Integrated (05/12/2023)   Received from [facility]   Social  Network    How would you rate your social network (family, work, friends)?: Adequate participation with social networks  Intimate Partner Violence: Not At Risk (05/12/2023)   Received from [facility]   HITS    Over the last 12 months how often did your partner physically hurt you?: Never    Over the last 12 months how often did your partner insult you or talk down to you?: Never    Over the last 12 months how often did your partner threaten you with physical harm?: Never    Over the last 12 months how often did your partner scream or curse at you?: Never  Depression (PHQ2-9): Not on file  Alcohol Screen: Not on file  Housing: Low Risk (06/09/2023)   Received from [facility]    In the last 12 months, was there a time when you were not able to pay the mortgage or rent on time?: No    In the past 12 months, how many times have you moved where you were living?: 0    At any time in the past 12 months, were you homeless or living in a shelter (including now)?: No  Utilities: Not At Risk (06/09/2023)   Received from [facility] Utilities    Threatened with loss of utilities: No  Health Literacy: Not on file    Review of Systems  Constitutional:  Positive for activity change.  Respiratory:  Positive for apnea.   Psychiatric/Behavioral:  Positive for sleep disturbance.     Vitals:   05/17/24 0942  BP: (!) 146/81  Pulse: 68  SpO2: 95%     Physical Exam Constitutional:  Appearance: He is obese.  HENT:     Head: Normocephalic.     Mouth/Throat:     Mouth: Mucous membranes are moist.     Comments: Mallampati 4, crowded oropharynx Eyes:     General: No scleral icterus.    Pupils: Pupils are equal, round, and reactive to light.  Neck:     Comments: Neck is thick Cardiovascular:     Rate and Rhythm: Normal rate.     Pulses: Normal pulses.     Heart sounds: Normal heart sounds. No murmur heard.    No friction rub.  Pulmonary:     Effort: Pulmonary effort  is normal. No respiratory distress.     Breath sounds: No stridor. No wheezing or rhonchi.  Musculoskeletal:     Cervical back: Normal range of motion. No rigidity or tenderness.  Skin:    General: Skin is warm.  Neurological:     General: No focal deficit present.     Mental Status: He is alert.  Psychiatric:        Mood and Affect: Mood normal.    Data Reviewed: I do not have his current compliance Last download that we had was from 2024  We were able to get compliance on in the last visit in July 2025 which showed good compliance  Assessment:   Severe obstructive sleep apnea on a CPAP of 15 - Tolerating it well - Continues to benefit from CPAP use - Still does have some tiredness during the day  Abnormal echocardiogram showing diastolic dysfunction, severely enlarged right ventricular size with dilated left atrium - Will continue to follow-up with cardiology  Class III obesity - Encouraged to continue weight loss efforts  Plan/Recommendations:  Continue CPAP nightly  Encouraged to call us  with significant concerns  Follow-up in about 6 months  Follow-up with cardiology  Jennet Epley MD  Pulmonary and Critical Care 05/17/2024, 9:52 AM  CC: Corrington, Kip A, MD   "

## 2024-11-21 ENCOUNTER — Ambulatory Visit: Admitting: Pulmonary Disease
